# Patient Record
Sex: Male | Born: 1945 | Race: White | Hispanic: No | Marital: Married | Smoking: Former smoker
Health system: Southern US, Community
[De-identification: ages and names within clinical notes are randomized; demographics above are authoritative.]

## PROBLEM LIST (undated history)

## (undated) ENCOUNTER — Emergency Department (HOSPITAL_BASED_OUTPATIENT_CLINIC_OR_DEPARTMENT_OTHER): Admission: EM | Payer: No Typology Code available for payment source

## (undated) DIAGNOSIS — I639 Cerebral infarction, unspecified: Secondary | ICD-10-CM

## (undated) DIAGNOSIS — J939 Pneumothorax, unspecified: Secondary | ICD-10-CM

## (undated) DIAGNOSIS — J8410 Pulmonary fibrosis, unspecified: Secondary | ICD-10-CM

## (undated) DIAGNOSIS — Z860100 Personal history of colon polyps, unspecified: Secondary | ICD-10-CM

## (undated) DIAGNOSIS — J189 Pneumonia, unspecified organism: Secondary | ICD-10-CM

## (undated) DIAGNOSIS — R51 Headache: Secondary | ICD-10-CM

## (undated) DIAGNOSIS — I10 Essential (primary) hypertension: Secondary | ICD-10-CM

## (undated) DIAGNOSIS — R413 Other amnesia: Secondary | ICD-10-CM

## (undated) DIAGNOSIS — M1990 Unspecified osteoarthritis, unspecified site: Secondary | ICD-10-CM

## (undated) DIAGNOSIS — R0602 Shortness of breath: Secondary | ICD-10-CM

## (undated) DIAGNOSIS — I739 Peripheral vascular disease, unspecified: Secondary | ICD-10-CM

## (undated) DIAGNOSIS — F419 Anxiety disorder, unspecified: Secondary | ICD-10-CM

## (undated) HISTORY — DX: Personal history of colon polyps, unspecified: Z86.0100

## (undated) HISTORY — PX: FRACTURE SURGERY: SHX138

## (undated) HISTORY — PX: EYE SURGERY: SHX253

## (undated) HISTORY — PX: COLONOSCOPY W/ BIOPSIES AND POLYPECTOMY: SHX1376

## (undated) HISTORY — PX: JOINT REPLACEMENT: SHX530

## (undated) HISTORY — DX: Pulmonary fibrosis, unspecified: J84.10

## (undated) HISTORY — DX: Other amnesia: R41.3

## (undated) HISTORY — PX: CARDIAC CATHETERIZATION: SHX172

## (undated) HISTORY — DX: Personal history of colonic polyps: Z86.010

## (undated) HISTORY — PX: TONSILLECTOMY: SUR1361

---

## 1997-09-13 ENCOUNTER — Ambulatory Visit (HOSPITAL_COMMUNITY): Admission: RE | Admit: 1997-09-13 | Discharge: 1997-09-13 | Payer: Self-pay | Admitting: Specialist

## 1997-09-19 ENCOUNTER — Ambulatory Visit (HOSPITAL_COMMUNITY): Admission: RE | Admit: 1997-09-19 | Discharge: 1997-09-19 | Payer: Self-pay | Admitting: Specialist

## 1998-09-05 ENCOUNTER — Emergency Department (HOSPITAL_COMMUNITY): Admission: EM | Admit: 1998-09-05 | Discharge: 1998-09-05 | Payer: Self-pay | Admitting: Emergency Medicine

## 1998-09-05 ENCOUNTER — Encounter: Payer: Self-pay | Admitting: Emergency Medicine

## 1999-04-21 ENCOUNTER — Ambulatory Visit (HOSPITAL_COMMUNITY): Admission: RE | Admit: 1999-04-21 | Discharge: 1999-04-21 | Payer: Self-pay | Admitting: Specialist

## 2000-05-09 ENCOUNTER — Inpatient Hospital Stay (HOSPITAL_COMMUNITY): Admission: EM | Admit: 2000-05-09 | Discharge: 2000-05-21 | Payer: Self-pay | Admitting: *Deleted

## 2000-05-09 ENCOUNTER — Encounter: Payer: Self-pay | Admitting: Surgery

## 2000-05-09 ENCOUNTER — Encounter: Payer: Self-pay | Admitting: Specialist

## 2000-05-10 ENCOUNTER — Encounter: Payer: Self-pay | Admitting: General Surgery

## 2000-05-11 ENCOUNTER — Encounter: Payer: Self-pay | Admitting: General Surgery

## 2000-05-12 ENCOUNTER — Encounter: Payer: Self-pay | Admitting: General Surgery

## 2000-05-13 ENCOUNTER — Encounter: Payer: Self-pay | Admitting: Orthopedic Surgery

## 2000-05-14 ENCOUNTER — Encounter: Payer: Self-pay | Admitting: Surgery

## 2000-05-17 ENCOUNTER — Encounter: Payer: Self-pay | Admitting: General Surgery

## 2000-05-18 ENCOUNTER — Encounter: Payer: Self-pay | Admitting: Surgery

## 2000-07-05 ENCOUNTER — Ambulatory Visit (HOSPITAL_COMMUNITY): Admission: RE | Admit: 2000-07-05 | Discharge: 2000-07-05 | Payer: Self-pay | Admitting: Family Medicine

## 2000-09-10 ENCOUNTER — Encounter: Admission: RE | Admit: 2000-09-10 | Discharge: 2000-09-10 | Payer: Self-pay | Admitting: Specialist

## 2000-09-10 ENCOUNTER — Encounter: Payer: Self-pay | Admitting: Specialist

## 2000-11-06 ENCOUNTER — Ambulatory Visit (HOSPITAL_COMMUNITY): Admission: RE | Admit: 2000-11-06 | Discharge: 2000-11-06 | Payer: Self-pay | Admitting: Family Medicine

## 2000-11-06 ENCOUNTER — Encounter: Payer: Self-pay | Admitting: Family Medicine

## 2001-09-01 ENCOUNTER — Encounter: Admission: RE | Admit: 2001-09-01 | Discharge: 2001-09-01 | Payer: Self-pay | Admitting: Family Medicine

## 2001-09-01 ENCOUNTER — Encounter: Payer: Self-pay | Admitting: Family Medicine

## 2002-03-22 ENCOUNTER — Ambulatory Visit (HOSPITAL_COMMUNITY): Admission: RE | Admit: 2002-03-22 | Discharge: 2002-03-22 | Payer: Self-pay | Admitting: Gastroenterology

## 2004-12-05 ENCOUNTER — Emergency Department (HOSPITAL_COMMUNITY): Admission: EM | Admit: 2004-12-05 | Discharge: 2004-12-06 | Payer: Self-pay | Admitting: Emergency Medicine

## 2010-12-31 ENCOUNTER — Emergency Department (HOSPITAL_COMMUNITY): Payer: Non-veteran care

## 2010-12-31 ENCOUNTER — Emergency Department (HOSPITAL_COMMUNITY)
Admission: EM | Admit: 2010-12-31 | Discharge: 2011-01-01 | Payer: Non-veteran care | Source: Home / Self Care | Attending: Emergency Medicine | Admitting: Emergency Medicine

## 2010-12-31 DIAGNOSIS — R072 Precordial pain: Secondary | ICD-10-CM | POA: Insufficient documentation

## 2010-12-31 DIAGNOSIS — E785 Hyperlipidemia, unspecified: Secondary | ICD-10-CM | POA: Insufficient documentation

## 2010-12-31 DIAGNOSIS — Z87891 Personal history of nicotine dependence: Secondary | ICD-10-CM | POA: Insufficient documentation

## 2010-12-31 DIAGNOSIS — I2510 Atherosclerotic heart disease of native coronary artery without angina pectoris: Secondary | ICD-10-CM | POA: Insufficient documentation

## 2010-12-31 DIAGNOSIS — Z8249 Family history of ischemic heart disease and other diseases of the circulatory system: Secondary | ICD-10-CM | POA: Insufficient documentation

## 2010-12-31 DIAGNOSIS — R61 Generalized hyperhidrosis: Secondary | ICD-10-CM | POA: Insufficient documentation

## 2010-12-31 DIAGNOSIS — R110 Nausea: Secondary | ICD-10-CM | POA: Insufficient documentation

## 2010-12-31 LAB — CBC
HCT: 40.2 % (ref 39.0–52.0)
Hemoglobin: 14.3 g/dL (ref 13.0–17.0)
MCHC: 35.6 g/dL (ref 30.0–36.0)
MCV: 90.3 fL (ref 78.0–100.0)
RDW: 12.1 % (ref 11.5–15.5)

## 2010-12-31 LAB — URINALYSIS, ROUTINE W REFLEX MICROSCOPIC
Bilirubin Urine: NEGATIVE
Glucose, UA: NEGATIVE mg/dL
Ketones, ur: NEGATIVE mg/dL
Protein, ur: NEGATIVE mg/dL
pH: 8 (ref 5.0–8.0)

## 2010-12-31 LAB — CK TOTAL AND CKMB (NOT AT ARMC)
CK, MB: 2.9 ng/mL (ref 0.3–4.0)
CK, MB: 2.6 ng/mL (ref 0.3–4.0)
Relative Index: INVALID (ref 0.0–2.5)
Relative Index: INVALID (ref 0.0–2.5)
Total CK: 88 U/L (ref 7–232)
Total CK: 71 U/L (ref 7–232)

## 2010-12-31 LAB — TROPONIN I
Troponin I: <0.3 ng/mL (ref <0.30)
Troponin I: <0.3 ng/mL (ref <0.30)

## 2010-12-31 LAB — COMPREHENSIVE METABOLIC PANEL
Albumin: 4 g/dL (ref 3.5–5.2)
Alkaline Phosphatase: 81 U/L (ref 39–117)
BUN: 20 mg/dL (ref 6–23)
Chloride: 104 mEq/L (ref 96–112)
Creatinine, Ser: 0.85 mg/dL (ref 0.50–1.35)
GFR calc Af Amer: >60 mL/min (ref >60)
Glucose, Bld: 110 mg/dL — ABNORMAL HIGH (ref 70–99)
Potassium: 4 mEq/L (ref 3.5–5.1)
Total Bilirubin: 0.4 mg/dL (ref 0.3–1.2)
Total Protein: 7.3 g/dL (ref 6.0–8.3)

## 2010-12-31 LAB — DIFFERENTIAL
Basophils Absolute: 0 10*3/uL (ref 0.0–0.1)
Eosinophils Relative: 2 % (ref 0–5)
Lymphocytes Relative: 17 % (ref 12–46)
Lymphs Abs: 1.1 10*3/uL (ref 0.7–4.0)
Monocytes Absolute: 0.6 10*3/uL (ref 0.1–1.0)
Monocytes Relative: 9 % (ref 3–12)
Neutro Abs: 4.6 10*3/uL (ref 1.7–7.7)

## 2011-01-01 ENCOUNTER — Observation Stay (HOSPITAL_COMMUNITY)
Admission: EM | Admit: 2011-01-01 | Discharge: 2011-01-02 | Disposition: A | Discharge status: Home / Self Care | Payer: Non-veteran care | Source: Ambulatory Visit | Attending: Cardiovascular Disease | Admitting: Cardiovascular Disease

## 2011-01-01 ENCOUNTER — Emergency Department (HOSPITAL_COMMUNITY): Payer: Non-veteran care

## 2011-01-01 DIAGNOSIS — F4310 Post-traumatic stress disorder, unspecified: Secondary | ICD-10-CM | POA: Insufficient documentation

## 2011-01-01 DIAGNOSIS — Z01812 Encounter for preprocedural laboratory examination: Secondary | ICD-10-CM | POA: Insufficient documentation

## 2011-01-01 DIAGNOSIS — R110 Nausea: Secondary | ICD-10-CM | POA: Insufficient documentation

## 2011-01-01 DIAGNOSIS — I2510 Atherosclerotic heart disease of native coronary artery without angina pectoris: Secondary | ICD-10-CM | POA: Insufficient documentation

## 2011-01-01 DIAGNOSIS — R61 Generalized hyperhidrosis: Secondary | ICD-10-CM | POA: Insufficient documentation

## 2011-01-01 DIAGNOSIS — Z8249 Family history of ischemic heart disease and other diseases of the circulatory system: Secondary | ICD-10-CM | POA: Insufficient documentation

## 2011-01-01 DIAGNOSIS — I714 Abdominal aortic aneurysm, without rupture, unspecified: Secondary | ICD-10-CM | POA: Insufficient documentation

## 2011-01-01 DIAGNOSIS — Z79899 Other long term (current) drug therapy: Secondary | ICD-10-CM | POA: Insufficient documentation

## 2011-01-01 DIAGNOSIS — E785 Hyperlipidemia, unspecified: Secondary | ICD-10-CM | POA: Insufficient documentation

## 2011-01-01 DIAGNOSIS — R079 Chest pain, unspecified: Principal | ICD-10-CM | POA: Insufficient documentation

## 2011-01-01 DIAGNOSIS — Z01810 Encounter for preprocedural cardiovascular examination: Secondary | ICD-10-CM | POA: Insufficient documentation

## 2011-01-01 DIAGNOSIS — Z01818 Encounter for other preprocedural examination: Secondary | ICD-10-CM | POA: Insufficient documentation

## 2011-01-01 LAB — CK TOTAL AND CKMB (NOT AT ARMC)
CK, MB: 2.4 ng/mL (ref 0.3–4.0)
Relative Index: INVALID (ref 0.0–2.5)

## 2011-01-01 LAB — BASIC METABOLIC PANEL
BUN: 13 mg/dL (ref 6–23)
CO2: 26 mEq/L (ref 19–32)
Chloride: 105 mEq/L (ref 96–112)
Creatinine, Ser: 0.82 mg/dL (ref 0.50–1.35)
Glucose, Bld: 96 mg/dL (ref 70–99)

## 2011-01-01 LAB — MAGNESIUM: Magnesium: 2.2 mg/dL (ref 1.5–2.5)

## 2011-01-01 MED ORDER — IOHEXOL 350 MG/ML SOLN
100.0000 mL | Freq: Once | INTRAVENOUS | Status: AC | PRN
  Administered 2011-01-01: 160 mL via INTRAVENOUS

## 2011-01-02 LAB — CBC
Hemoglobin: 12.8 g/dL — ABNORMAL LOW (ref 13.0–17.0)
MCHC: 34.4 g/dL (ref 30.0–36.0)
RBC: 4.04 MIL/uL — ABNORMAL LOW (ref 4.22–5.81)
WBC: 6.1 10*3/uL (ref 4.0–10.5)

## 2011-01-02 LAB — BASIC METABOLIC PANEL
Chloride: 108 mEq/L (ref 96–112)
GFR calc Af Amer: >60 mL/min (ref >60)
GFR calc non Af Amer: >60 mL/min (ref >60)
Glucose, Bld: 91 mg/dL (ref 70–99)
Potassium: 3.8 mEq/L (ref 3.5–5.1)
Sodium: 140 mEq/L (ref 135–145)

## 2011-01-07 NOTE — Cardiovascular Report (Signed)
NAME:  Luke Golden, Luke Golden NO.:  [id number]  MEDICAL RECORD NO.:  [id number]  LOCATION:  [facility]                         FACILITY:  [facility]  PHYSICIAN:  Nicki Guadalajara, M.D.     DATE OF BIRTH:  March 11, 1946  DATE OF PROCEDURE:  01/01/2011 DATE OF DISCHARGE:  01/01/2011                           CARDIAC CATHETERIZATION   INDICATIONS:  Mr. Luke Golden is a 65 year old gentleman who has a history of hyperlipidemia, family history of coronary artery disease. Apparently, he may have had a normal cardiac catheterization over 10 years ago.  Apparently, he presented to [facility] last night. The patient does work nights and yesterday after taking Viagra became hypotensive and also developed midsternal chest pain, which had awakened him from sleep.  He had a cardiac CT scan today, which showed a calcium score of 1076.  The patient was seen in the emergency room today by Dr. Royann Shivers.  Cardiac enzymes were negative.  He is now referred for definitive cardiac catheterization.  PROCEDURE:  The patient was extremely anxious and wanted significant sedation for the procedure.  In the emergency room, he had received earlier 10 mg of Valium.  In the cardiac catheterization laboratory, he received 2 mg of Versed plus 50 mcg of fentanyl.  Right femoral artery was punctured anteriorly and a 5-French sheath was inserted without difficulty.  Diagnostic cardiac catheterization was done utilizing 5- [place] Judkins for left and right coronary catheters.  A 5-French pigtail catheter was used for left ventriculography.  This was used for the left ventriculography as well as distal aortography.  The patient tolerated the procedure well.  Hemostasis was obtained by direct manual pressure.  HEMODYNAMIC DATA:  Central aortic pressure is 110/50.  Left ventricular pressure 110/12.  ANGIOGRAPHIC DATA:  There was evidence for significant calcification involving the left main, LAD, and right  coronary artery.  The left main had ostial calcification with ostial narrowing of 20%.  There was mild distal narrowing of 10% and bifurcated into the LAD and left circumflex system.  The LAD was calcified and had irregularity proximally.  There was some mid LAD intramyocardial segment without significant focal stenoses.  The circumflex ostium had 20% smooth narrowing and gave rise to one major marginal vessel.  There was calcification in the sinus of Valsalva extending into the aorta in the region of the right coronary artery takeoff.  The right coronary artery gave rise to a very proximal conus branch.  There was 40% ostial and proximal irregularity in the right coronary artery. There was 20% mild narrowing in the ostium of the PDA.  Left ventriculography revealed ejection fraction of approximately 50- 55%.  There was a suggestion of mild angiographic mitral valve prolapse.  Distal aortography did not demonstrate any renal artery stenoses.  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  IMPRESSION: 1. Evidence for coronary calcification involving the left main, left     anterior descending coronary artery, circumflex, and right coronary     artery with 20% ostial left main narrowing, luminal irregularities     without significant obstruction in the left anterior descending  coronary artery; 20% ostial circumflex narrowing; and 40-50% ostial     proximal right coronary artery stenosis with 20% posterior     descending artery stenosis. 2. 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  RECOMMENDATIONS:  Medical therapy.  The patient will require aggressive lipid management in attempt to induce plaque stability and regression.          ______________________________ Nicki Guadalajara, M.D.     TK/MEDQ  D:  01/01/2011  T:  01/02/2011  Job:  [id number]  cc:   [facility] Dr[facility] Evette Cristal  Electronically  Signed by Nicki Guadalajara M.D. on 01/07/2011 04:37:46 PM
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
## 2011-01-12 NOTE — Discharge Summary (Signed)
  Luke Golden, Luke Golden                ACCOUNT NO.:  [id number]  MEDICAL RECORD NO.:  [id number]  LOCATION:  [facility]                         FACILITY:  [facility]  PHYSICIAN:  Thurmon Fair, MD     DATE OF BIRTH:  [birth year]  DATE OF ADMISSION:  12/31/2010 DATE OF DISCHARGE:  01/01/2011                              DISCHARGE SUMMARY   DISCHARGE DIAGNOSES: 1. Chest pain worrisome for unstable angina, moderate coronary artery     disease at catheterization this admission to be treated medically. 2. Treated dyslipidemia. 3. Post-traumatic stress disorder. 4. Family history of coronary artery disease. 5. Baseline sinus bradycardia, the patient was not sent home on a beta-     blocker.  HOSPITAL COURSE:  The patient is a 65 year old [place] veteran with post- traumatic stress disorder who [facility]  He reportedly had a catheterization more than 10 years ago which was normal.  He does have risk factors for coronary artery disease.  He presented to [facility] emergency room December 31, 2010, with chest pain, he was brought in by EMS.  Please see history and physical for complete details.  Coronary CT done showed high calcium score and we were asked to see him in consult.  The patient was seen by Dr. Royann Shivers.  His symptoms were worrisome for angina.  It was decided to proceed with a diagnostic coronary angiography.  This was done on January 01, 2011, by Dr. Daphene Jaeger.  This revealed a 50% proximal calcified RCA, with no other significant coronary artery disease.  Ejection fraction was 50- 55%, he did have mitral valve prolapse noted.  Renal arteries and iliac were essentially normal.  Plan is for continued aggressive medical therapy.  We did not put him on a beta blocker because of baseline bradycardia.  We did add low-dose Norvasc and ACE inhibitor and increased his statin.  He is a nonsmoker.  He will follow up with Dr. Royann Shivers as an outpatient.  LABORATORY DATA:  At  discharge, his white count is 6.1, hemoglobin 12.8, hematocrit 37.2, platelets 143.  Sodium 140, potassium 3.8, BUN 15, creatinine 0.96.  Troponins were negative x4.  TSH 2.78.  CK-MBs were negative.  Urinalysis unremarkable.  Liver functions were normal.  Two view chest x-ray showed no evidence of cardiopulmonary disease. Coronary CT showed calcium score of 1076 with some hard plaque in the origin of the LAD, by CT the stenosis appeared to be 50% or greater. EKG shows sinus rhythm, sinus bradycardia.  DISPOSITION:  The patient is discharged in stable condition and will follow up with Dr. Royann Shivers.  Please see med rec for complete discharge medications.     Abelino Derrick, P.A.   ______________________________ Thurmon Fair, MD    LKK/MEDQ  D:  01/02/2011  T:  01/02/2011  Job:  [id number]  cc:   Dr. Flonnie Hailstone  Electronically Signed by Corine Shelter P.A. on 01/12/2011 09:02:08 AM Electronically Signed by Thurmon Fair M.D. on 01/12/2011 01:40:21 PM

## 2011-10-28 ENCOUNTER — Other Ambulatory Visit: Payer: Self-pay | Admitting: Cardiology

## 2011-10-28 ENCOUNTER — Encounter (INDEPENDENT_AMBULATORY_CARE_PROVIDER_SITE_OTHER): Payer: Non-veteran care

## 2011-10-28 DIAGNOSIS — M7989 Other specified soft tissue disorders: Secondary | ICD-10-CM

## 2011-10-28 DIAGNOSIS — R609 Edema, unspecified: Secondary | ICD-10-CM

## 2011-10-28 DIAGNOSIS — M79609 Pain in unspecified limb: Secondary | ICD-10-CM

## 2011-12-01 ENCOUNTER — Other Ambulatory Visit (HOSPITAL_COMMUNITY): Payer: Non-veteran care

## 2011-12-08 ENCOUNTER — Encounter (HOSPITAL_COMMUNITY): Payer: Self-pay | Admitting: Pharmacy Technician

## 2011-12-15 ENCOUNTER — Other Ambulatory Visit (HOSPITAL_COMMUNITY): Payer: Non-veteran care

## 2011-12-18 ENCOUNTER — Encounter (HOSPITAL_COMMUNITY)
Admission: RE | Admit: 2011-12-18 | Discharge: 2011-12-18 | Disposition: A | Discharge status: Home / Self Care | Payer: Non-veteran care | Source: Ambulatory Visit | Attending: Orthopedic Surgery | Admitting: Orthopedic Surgery

## 2011-12-18 ENCOUNTER — Encounter (HOSPITAL_COMMUNITY): Admission: RE | Admit: 2011-12-18 | Payer: Non-veteran care | Source: Ambulatory Visit

## 2011-12-18 ENCOUNTER — Encounter (HOSPITAL_COMMUNITY): Payer: Self-pay

## 2011-12-18 HISTORY — DX: Essential (primary) hypertension: I10

## 2011-12-18 HISTORY — DX: Pneumothorax, unspecified: J93.9

## 2011-12-18 HISTORY — DX: Unspecified osteoarthritis, unspecified site: M19.90

## 2011-12-18 HISTORY — DX: Pneumonia, unspecified organism: J18.9

## 2011-12-18 HISTORY — DX: Shortness of breath: R06.02

## 2011-12-18 HISTORY — DX: Peripheral vascular disease, unspecified: I73.9

## 2011-12-18 HISTORY — DX: Anxiety disorder, unspecified: F41.9

## 2011-12-18 LAB — URINALYSIS, ROUTINE W REFLEX MICROSCOPIC
Leukocytes, UA: NEGATIVE
Protein, ur: NEGATIVE mg/dL
Urobilinogen, UA: 0.2 mg/dL (ref 0.0–1.0)

## 2011-12-18 LAB — TYPE AND SCREEN
ABO/RH(D): O NEG
Antibody Screen: NEGATIVE

## 2011-12-18 LAB — CBC
MCH: 31.6 pg (ref 26.0–34.0)
MCHC: 33.7 g/dL (ref 30.0–36.0)
Platelets: 156 10*3/uL (ref 150–400)
RBC: 4.5 MIL/uL (ref 4.22–5.81)

## 2011-12-18 LAB — COMPREHENSIVE METABOLIC PANEL
ALT: 13 U/L (ref 0–53)
AST: 25 U/L (ref 0–37)
Albumin: 3.8 g/dL (ref 3.5–5.2)
Calcium: 9.5 mg/dL (ref 8.4–10.5)
Sodium: 139 mEq/L (ref 135–145)
Total Protein: 7.4 g/dL (ref 6.0–8.3)

## 2011-12-18 LAB — SURGICAL PCR SCREEN: MRSA, PCR: NEGATIVE

## 2011-12-18 NOTE — Pre-Procedure Instructions (Signed)
20 AUTREY HUMAN  12/18/2011   Your procedure is scheduled on:  12/23/2011  Report to [facility] at 6:30 AM.  Call this number if you have problems the morning of surgery: [phone]   Remember:   Do not eat food or drink :After Midnight.  TUESDAY      Take these medicines the morning of surgery with A SIP OF WATER:  BUSPAR   Do not wear jewelry, make-up or nail polish.  Do not wear lotions, powders, or perfumes. You may wear deodorant.  Do not shave 48 hours prior to surgery. Men may shave face and neck.  Do not bring valuables to the hospital.  Contacts, dentures or bridgework may not be worn into surgery.  Leave suitcase in the car. After surgery it may be brought to your room.  For patients admitted to the hospital, checkout time is 11:00 AM the day of discharge.   Patients discharged the day of surgery will not be allowed to drive home.  Name and phone number of your driver: /w wife  Special Instructions: CHG Shower Use Special Wash: 1/2 bottle night before surgery and 1/2 bottle morning of surgery.   Please read over the following fact sheets that you were given: Pain Booklet, Coughing and Deep Breathing, Blood Transfusion Information, Total Joint Packet, MRSA Information and Surgical Site Infection Prevention

## 2011-12-18 NOTE — Progress Notes (Signed)
[facility] [place] Report he has had additional cardiac testing at [facility] since Cardiac cath. Done here 2012.

## 2011-12-22 MED ORDER — VANCOMYCIN HCL IN DEXTROSE 1-5 GM/200ML-% IV SOLN
1000.0000 mg | INTRAVENOUS | Status: AC
  Administered 2011-12-23: 1000 mg via INTRAVENOUS
  Filled 2011-12-22: qty 200

## 2011-12-22 NOTE — H&P (Signed)
  [facility] [address]   [address] [place], [place] [id number] [phone] A Division of [facility]  Loreta Ave, M.D.     Robert A. Thurston Hole, M.D.     Lunette Stands, M.D. Eulas Post, M.D.    Buford Dresser, M.D. Estell Harpin, M.D. Genene Churn. Barry Dienes, PA-C            Kirstin A. Shepperson, PA-C [place], OPA-C   RE: Luke, Golden                                [id number]      DOB: February 12, 1946 PROGRESS NOTE: 12-15-11 Chief complaint: Left knee pain.  History of present illness: 66 year-old white male with a history of end stage DJD, left knee, and chronic pain.  Returns.  States that knee symptoms are unchanged from previous visit.  He is wanting to proceed with total knee replacement as scheduled next week.  We received appropriate pre-op medical and cardiac clearances.   Current medications: Artificial Tears, Aspirin, Colace, Ibuprofen, Lisinopril, Toprol, Omeprazole, Rosuvastatin, Nitroglycerin 0.4 mg sublingual tablets p.r.n. and Viagra. Allergies: Tylenol, Cephalexin and Lipitor. Past medical/surgical history: Posttraumatic stress disorder, hypertension, hypercholesterolemia, coronary artery disease and GERD. Review of systems: Patient currently denies lightheadedness, dizziness, fevers or chills, cardiac, pulmonary, GI or GU issues. Family history: Positive for heart disease and arthritis. 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          EXAMINATION: Height: 5?8.  Weight: 206 pounds.  Blood pressure: 124/64.  Pulse: 48.  Temperature: 98.3.  Pleasant white male, alert and oriented x 3 and in no acute distress.  No increase in respiratory effort.  Head is normocephalic, a traumatic.  PERRLA, EOMI.  Cervical spine unremarkable.  Lungs: CTA bilaterally.  No wheezes.  Heart: RRR, bradycardic.  Abdomen: Round and non-distended.  NBS x 4.  Soft and non-tender.  Left knee: Decreased range of  motion.  Positive crepitus.  Joint line tender.  Positive effusion.  Ligaments stable.  Calf non-tender.  Neurovascularly intact.  Skin warm and dry.   IMPRESSION: End stage DJD, left knee, and chronic pain.  Failed conservative treatment.  PLAN: We will proceed with left total knee replacement as scheduled.  Surgical procedure, along with potential rehab/recovery time discussed.  All questions answered.  There have been multiple phone calls and discussions regarding postoperative care.  We feel that it will be best that he discharge home from the hospital instead of to a rehab center post-op.  His wife will be immediately available to him for about 12-13 days post-op and then he will see about getting someone else to help him afterwards.  Patient's wife agreed with this plan.    Loreta Ave, M.D.   Electronically verified by Loreta Ave, M.D. DFM(JMO):jjh D 12-15-11 T 12-16-11

## 2011-12-23 ENCOUNTER — Inpatient Hospital Stay (HOSPITAL_COMMUNITY): Payer: Non-veteran care

## 2011-12-23 ENCOUNTER — Encounter (HOSPITAL_COMMUNITY): Payer: Self-pay | Admitting: *Deleted

## 2011-12-23 ENCOUNTER — Ambulatory Visit (HOSPITAL_COMMUNITY): Payer: Non-veteran care | Admitting: Certified Registered Nurse Anesthetist

## 2011-12-23 ENCOUNTER — Encounter (HOSPITAL_COMMUNITY): Payer: Self-pay | Admitting: Anesthesiology

## 2011-12-23 ENCOUNTER — Encounter (HOSPITAL_COMMUNITY)
Admission: RE | Disposition: A | Discharge status: Home Health | Payer: Self-pay | Source: Ambulatory Visit | Attending: Orthopedic Surgery

## 2011-12-23 ENCOUNTER — Encounter (HOSPITAL_COMMUNITY): Payer: Self-pay | Admitting: Certified Registered Nurse Anesthetist

## 2011-12-23 ENCOUNTER — Inpatient Hospital Stay (HOSPITAL_COMMUNITY)
Admission: RE | Admit: 2011-12-23 | Discharge: 2011-12-25 | DRG: 470 | Disposition: A | Discharge status: Home Health | Payer: Non-veteran care | Source: Ambulatory Visit | Attending: Orthopedic Surgery | Admitting: Orthopedic Surgery

## 2011-12-23 DIAGNOSIS — Z01812 Encounter for preprocedural laboratory examination: Secondary | ICD-10-CM

## 2011-12-23 DIAGNOSIS — I2510 Atherosclerotic heart disease of native coronary artery without angina pectoris: Secondary | ICD-10-CM | POA: Diagnosis present

## 2011-12-23 DIAGNOSIS — Z471 Aftercare following joint replacement surgery: Secondary | ICD-10-CM

## 2011-12-23 DIAGNOSIS — Z8249 Family history of ischemic heart disease and other diseases of the circulatory system: Secondary | ICD-10-CM

## 2011-12-23 DIAGNOSIS — I10 Essential (primary) hypertension: Secondary | ICD-10-CM | POA: Diagnosis present

## 2011-12-23 DIAGNOSIS — F4310 Post-traumatic stress disorder, unspecified: Secondary | ICD-10-CM | POA: Diagnosis present

## 2011-12-23 DIAGNOSIS — M1710 Unilateral primary osteoarthritis, unspecified knee: Principal | ICD-10-CM | POA: Diagnosis present

## 2011-12-23 DIAGNOSIS — K219 Gastro-esophageal reflux disease without esophagitis: Secondary | ICD-10-CM | POA: Diagnosis present

## 2011-12-23 HISTORY — PX: TOTAL KNEE ARTHROPLASTY: SHX125

## 2011-12-23 SURGERY — ARTHROPLASTY, KNEE, TOTAL
Anesthesia: General | Site: Knee | Laterality: Left | Wound class: Clean

## 2011-12-23 MED ORDER — METOPROLOL TARTRATE 25 MG PO TABS
25.0000 mg | ORAL_TABLET | Freq: Every day | ORAL | Status: DC
  Administered 2011-12-24: 25 mg via ORAL
  Filled 2011-12-23 (×3): qty 1

## 2011-12-23 MED ORDER — LIDOCAINE HCL (CARDIAC) 20 MG/ML IV SOLN
INTRAVENOUS | Status: DC | PRN
  Administered 2011-12-23: 50 mg via INTRAVENOUS

## 2011-12-23 MED ORDER — FENTANYL CITRATE 0.05 MG/ML IJ SOLN
INTRAMUSCULAR | Status: DC | PRN
  Administered 2011-12-23 (×5): 50 ug via INTRAVENOUS
  Administered 2011-12-23: 25 ug via INTRAVENOUS
  Administered 2011-12-23: 50 ug via INTRAVENOUS

## 2011-12-23 MED ORDER — POTASSIUM CHLORIDE IN NACL 20-0.9 MEQ/L-% IV SOLN
INTRAVENOUS | Status: DC
  Administered 2011-12-23 – 2011-12-25 (×3): via INTRAVENOUS
  Filled 2011-12-23 (×7): qty 1000

## 2011-12-23 MED ORDER — KETOROLAC TROMETHAMINE 30 MG/ML IJ SOLN: 15.0000 mg | Freq: Once | INTRAMUSCULAR | Status: DC | PRN

## 2011-12-23 MED ORDER — DOCUSATE SODIUM 100 MG PO CAPS
100.0000 mg | ORAL_CAPSULE | Freq: Two times a day (BID) | ORAL | Status: DC
  Administered 2011-12-23 – 2011-12-25 (×4): 100 mg via ORAL
  Filled 2011-12-23 (×6): qty 1

## 2011-12-23 MED ORDER — BUPIVACAINE HCL (PF) 0.25 % IJ SOLN
INTRAMUSCULAR | Status: AC
  Filled 2011-12-23: qty 30

## 2011-12-23 MED ORDER — WARFARIN VIDEO
Freq: Once | Status: AC
  Administered 2011-12-23: 13:00:00

## 2011-12-23 MED ORDER — BUPIVACAINE HCL (PF) 0.25 % IJ SOLN
INTRAMUSCULAR | Status: DC | PRN
  Administered 2011-12-23: 30 mL via INTRA_ARTICULAR

## 2011-12-23 MED ORDER — SODIUM CHLORIDE 0.9 % IR SOLN
Status: DC | PRN
  Administered 2011-12-23: 3000 mL

## 2011-12-23 MED ORDER — MEPERIDINE HCL 25 MG/ML IJ SOLN: 6.2500 mg | INTRAMUSCULAR | Status: DC | PRN

## 2011-12-23 MED ORDER — MIDAZOLAM HCL 5 MG/5ML IJ SOLN
INTRAMUSCULAR | Status: DC | PRN
  Administered 2011-12-23: 2 mg via INTRAVENOUS

## 2011-12-23 MED ORDER — MORPHINE SULFATE (PF) 1 MG/ML IV SOLN
INTRAVENOUS | Status: DC
  Administered 2011-12-23: 8.08 mg via INTRAVENOUS
  Administered 2011-12-23: 16.5 mg via INTRAVENOUS
  Administered 2011-12-23: 11:00:00 via INTRAVENOUS
  Administered 2011-12-24: 7.3 mg via INTRAVENOUS
  Administered 2011-12-24: 6 mg via INTRAVENOUS
  Administered 2011-12-24: 03:00:00 via INTRAVENOUS
  Administered 2011-12-24: 15 mg via INTRAVENOUS
  Filled 2011-12-23 (×2): qty 25

## 2011-12-23 MED ORDER — METHOCARBAMOL 500 MG PO TABS
ORAL_TABLET | ORAL | Status: AC
  Filled 2011-12-23: qty 1

## 2011-12-23 MED ORDER — ACETAMINOPHEN 325 MG PO TABS: 650.0000 mg | ORAL_TABLET | Freq: Four times a day (QID) | ORAL | Status: DC | PRN

## 2011-12-23 MED ORDER — BISACODYL 10 MG RE SUPP: 10.0000 mg | Freq: Every day | RECTAL | Status: DC | PRN

## 2011-12-23 MED ORDER — ACETAMINOPHEN 650 MG RE SUPP: 650.0000 mg | Freq: Four times a day (QID) | RECTAL | Status: DC | PRN

## 2011-12-23 MED ORDER — LACTATED RINGERS IV SOLN
INTRAVENOUS | Status: DC | PRN
  Administered 2011-12-23 (×2): via INTRAVENOUS

## 2011-12-23 MED ORDER — PROPOFOL 10 MG/ML IV EMUL
INTRAVENOUS | Status: DC | PRN
  Administered 2011-12-23: 200 mg via INTRAVENOUS

## 2011-12-23 MED ORDER — SODIUM CHLORIDE 0.9 % IJ SOLN: 9.0000 mL | INTRAMUSCULAR | Status: DC | PRN

## 2011-12-23 MED ORDER — ONDANSETRON HCL 4 MG/2ML IJ SOLN: 4.0000 mg | Freq: Four times a day (QID) | INTRAMUSCULAR | Status: DC | PRN

## 2011-12-23 MED ORDER — ENOXAPARIN SODIUM 30 MG/0.3ML ~~LOC~~ SOLN
30.0000 mg | Freq: Two times a day (BID) | SUBCUTANEOUS | Status: DC
  Administered 2011-12-24 – 2011-12-25 (×3): 30 mg via SUBCUTANEOUS
  Filled 2011-12-23 (×5): qty 0.3

## 2011-12-23 MED ORDER — SENNOSIDES-DOCUSATE SODIUM 8.6-50 MG PO TABS: 1.0000 | ORAL_TABLET | Freq: Every evening | ORAL | Status: DC | PRN

## 2011-12-23 MED ORDER — BUSPIRONE HCL 10 MG PO TABS
10.0000 mg | ORAL_TABLET | Freq: Two times a day (BID) | ORAL | Status: DC
  Administered 2011-12-24 – 2011-12-25 (×3): 10 mg via ORAL
  Filled 2011-12-23 (×5): qty 1

## 2011-12-23 MED ORDER — SIMVASTATIN 10 MG PO TABS
10.0000 mg | ORAL_TABLET | Freq: Every day | ORAL | Status: DC
  Administered 2011-12-23 – 2011-12-25 (×3): 10 mg via ORAL
  Filled 2011-12-23 (×3): qty 1

## 2011-12-23 MED ORDER — OXYCODONE HCL 5 MG PO TABS
ORAL_TABLET | ORAL | Status: AC
  Filled 2011-12-23: qty 1

## 2011-12-23 MED ORDER — LISINOPRIL 2.5 MG PO TABS
2.5000 mg | ORAL_TABLET | Freq: Every day | ORAL | Status: DC
  Administered 2011-12-24: 2.5 mg via ORAL
  Filled 2011-12-23 (×3): qty 1

## 2011-12-23 MED ORDER — PHENOL 1.4 % MT LIQD: 1.0000 | OROMUCOSAL | Status: DC | PRN

## 2011-12-23 MED ORDER — ONDANSETRON HCL 4 MG/2ML IJ SOLN
INTRAMUSCULAR | Status: DC | PRN
  Administered 2011-12-23: 4 mg via INTRAVENOUS

## 2011-12-23 MED ORDER — ONDANSETRON HCL 4 MG PO TABS: 4.0000 mg | ORAL_TABLET | Freq: Four times a day (QID) | ORAL | Status: DC | PRN

## 2011-12-23 MED ORDER — MORPHINE SULFATE 4 MG/ML IJ SOLN
INTRAMUSCULAR | Status: DC | PRN
  Administered 2011-12-23: 4 mg

## 2011-12-23 MED ORDER — MENTHOL 3 MG MT LOZG: 1.0000 | LOZENGE | OROMUCOSAL | Status: DC | PRN

## 2011-12-23 MED ORDER — WARFARIN - PHARMACIST DOSING INPATIENT: Freq: Every day | Status: DC

## 2011-12-23 MED ORDER — METOCLOPRAMIDE HCL 10 MG PO TABS: 5.0000 mg | ORAL_TABLET | Freq: Three times a day (TID) | ORAL | Status: DC | PRN

## 2011-12-23 MED ORDER — METOCLOPRAMIDE HCL 5 MG/ML IJ SOLN: 5.0000 mg | Freq: Three times a day (TID) | INTRAMUSCULAR | Status: DC | PRN

## 2011-12-23 MED ORDER — COUMADIN BOOK
Freq: Once | Status: AC
  Administered 2011-12-23: 19:00:00
  Filled 2011-12-23: qty 1

## 2011-12-23 MED ORDER — MORPHINE SULFATE (PF) 1 MG/ML IV SOLN
INTRAVENOUS | Status: AC
  Filled 2011-12-23: qty 25

## 2011-12-23 MED ORDER — DEXAMETHASONE SODIUM PHOSPHATE 4 MG/ML IJ SOLN
INTRAMUSCULAR | Status: DC | PRN
  Administered 2011-12-23: 4 mg via INTRAVENOUS

## 2011-12-23 MED ORDER — MORPHINE SULFATE 4 MG/ML IJ SOLN
INTRAMUSCULAR | Status: AC
  Filled 2011-12-23: qty 1

## 2011-12-23 MED ORDER — OXYCODONE HCL 5 MG/5ML PO SOLN: 5.0000 mg | Freq: Once | ORAL | Status: AC | PRN

## 2011-12-23 MED ORDER — HYDROMORPHONE HCL PF 1 MG/ML IJ SOLN: 0.2500 mg | INTRAMUSCULAR | Status: DC | PRN

## 2011-12-23 MED ORDER — NALOXONE HCL 0.4 MG/ML IJ SOLN: 0.4000 mg | INTRAMUSCULAR | Status: DC | PRN

## 2011-12-23 MED ORDER — DIPHENHYDRAMINE HCL 50 MG/ML IJ SOLN: 12.5000 mg | Freq: Four times a day (QID) | INTRAMUSCULAR | Status: DC | PRN

## 2011-12-23 MED ORDER — EPHEDRINE SULFATE 50 MG/ML IJ SOLN
INTRAMUSCULAR | Status: DC | PRN
  Administered 2011-12-23: 10 mg via INTRAVENOUS

## 2011-12-23 MED ORDER — DIPHENHYDRAMINE HCL 12.5 MG/5ML PO ELIX: 12.5000 mg | ORAL_SOLUTION | Freq: Four times a day (QID) | ORAL | Status: DC | PRN

## 2011-12-23 MED ORDER — VANCOMYCIN HCL IN DEXTROSE 1-5 GM/200ML-% IV SOLN
1000.0000 mg | Freq: Two times a day (BID) | INTRAVENOUS | Status: AC
Start: 2011-12-23 — End: 2011-12-24
  Administered 2011-12-23 – 2011-12-24 (×2): 1000 mg via INTRAVENOUS
  Filled 2011-12-23 (×2): qty 200

## 2011-12-23 MED ORDER — WARFARIN SODIUM 7.5 MG PO TABS
7.5000 mg | ORAL_TABLET | Freq: Once | ORAL | Status: AC
  Administered 2011-12-23: 7.5 mg via ORAL
  Filled 2011-12-23: qty 1

## 2011-12-23 MED ORDER — ALUM & MAG HYDROXIDE-SIMETH 200-200-20 MG/5ML PO SUSP: 30.0000 mL | ORAL | Status: DC | PRN

## 2011-12-23 MED ORDER — PROMETHAZINE HCL 25 MG/ML IJ SOLN: 6.2500 mg | INTRAMUSCULAR | Status: DC | PRN

## 2011-12-23 MED ORDER — OXYCODONE HCL 5 MG PO TABS
5.0000 mg | ORAL_TABLET | Freq: Once | ORAL | Status: AC | PRN
  Administered 2011-12-23: 5 mg via ORAL

## 2011-12-23 MED ORDER — METHOCARBAMOL 100 MG/ML IJ SOLN
500.0000 mg | Freq: Four times a day (QID) | INTRAMUSCULAR | Status: DC | PRN
  Filled 2011-12-23: qty 5

## 2011-12-23 MED ORDER — METHOCARBAMOL 500 MG PO TABS
500.0000 mg | ORAL_TABLET | Freq: Four times a day (QID) | ORAL | Status: DC | PRN
  Administered 2011-12-23 – 2011-12-24 (×3): 500 mg via ORAL
  Filled 2011-12-23 (×2): qty 1

## 2011-12-23 SURGICAL SUPPLY — 56 items
BANDAGE ESMARK 6X9 LF (GAUZE/BANDAGES/DRESSINGS) ×1 IMPLANT
BLADE SAG 18X100X1.27 (BLADE) ×4 IMPLANT
BNDG CMPR 9X6 STRL LF SNTH (GAUZE/BANDAGES/DRESSINGS) ×1
BNDG ESMARK 6X9 LF (GAUZE/BANDAGES/DRESSINGS) ×2
BOOTCOVER CLEANROOM LRG (PROTECTIVE WEAR) ×4 IMPLANT
BOWL SMART MIX CTS (DISPOSABLE) ×2 IMPLANT
CEMENT BONE SIMPLEX SPEEDSET (Cement) ×4 IMPLANT
CLOTH BEACON ORANGE TIMEOUT ST (SAFETY) ×2 IMPLANT
COVER BACK TABLE 24X17X13 BIG (DRAPES) ×2 IMPLANT
COVER SURGICAL LIGHT HANDLE (MISCELLANEOUS) ×2 IMPLANT
CUFF TOURNIQUET SINGLE 34IN LL (TOURNIQUET CUFF) ×2 IMPLANT
DRAPE EXTREMITY T 121X128X90 (DRAPE) ×2 IMPLANT
DRAPE PROXIMA HALF (DRAPES) ×2 IMPLANT
DRAPE U-SHAPE 47X51 STRL (DRAPES) ×2 IMPLANT
DRSG PAD ABDOMINAL 8X10 ST (GAUZE/BANDAGES/DRESSINGS) ×2 IMPLANT
DURAPREP 26ML APPLICATOR (WOUND CARE) ×2 IMPLANT
ELECT CAUTERY BLADE 6.4 (BLADE) ×2 IMPLANT
ELECT REM PT RETURN 9FT ADLT (ELECTROSURGICAL) ×2
ELECTRODE REM PT RTRN 9FT ADLT (ELECTROSURGICAL) ×1 IMPLANT
EVACUATOR 1/8 PVC DRAIN (DRAIN) ×2 IMPLANT
FACESHIELD LNG OPTICON STERILE (SAFETY) ×2 IMPLANT
GAUZE XEROFORM 5X9 LF (GAUZE/BANDAGES/DRESSINGS) ×2 IMPLANT
GLOVE BIOGEL PI IND STRL 8 (GLOVE) ×1 IMPLANT
GLOVE BIOGEL PI INDICATOR 8 (GLOVE) ×1
GLOVE ORTHO TXT STRL SZ7.5 (GLOVE) ×2 IMPLANT
GOWN PREVENTION PLUS XLARGE (GOWN DISPOSABLE) ×4 IMPLANT
GOWN STRL NON-REIN LRG LVL3 (GOWN DISPOSABLE) ×4 IMPLANT
GOWN STRL REIN 2XL XLG LVL4 (GOWN DISPOSABLE) ×2 IMPLANT
HANDPIECE INTERPULSE COAX TIP (DISPOSABLE) ×2
IMMOBILIZER KNEE 22 UNIV (SOFTGOODS) ×2 IMPLANT
IMMOBILIZER KNEE 24 THIGH 36 (MISCELLANEOUS) IMPLANT
IMMOBILIZER KNEE 24 UNIV (MISCELLANEOUS)
KIT BASIN OR (CUSTOM PROCEDURE TRAY) ×2 IMPLANT
KIT ROOM TURNOVER OR (KITS) ×2 IMPLANT
MANIFOLD NEPTUNE II (INSTRUMENTS) ×2 IMPLANT
NS IRRIG 1000ML POUR BTL (IV SOLUTION) ×2 IMPLANT
PACK TOTAL JOINT (CUSTOM PROCEDURE TRAY) ×2 IMPLANT
PAD ARMBOARD 7.5X6 YLW CONV (MISCELLANEOUS) ×4 IMPLANT
PAD CAST 4YDX4 CTTN HI CHSV (CAST SUPPLIES) ×1 IMPLANT
PADDING CAST COTTON 4X4 STRL (CAST SUPPLIES) ×2
PADDING CAST COTTON 6X4 STRL (CAST SUPPLIES) ×2 IMPLANT
RUBBERBAND STERILE (MISCELLANEOUS) ×2 IMPLANT
SET HNDPC FAN SPRY TIP SCT (DISPOSABLE) ×1 IMPLANT
SPONGE GAUZE 4X4 12PLY (GAUZE/BANDAGES/DRESSINGS) ×2 IMPLANT
STAPLER VISISTAT 35W (STAPLE) ×2 IMPLANT
SUCTION FRAZIER TIP 10 FR DISP (SUCTIONS) ×2 IMPLANT
SUT VIC AB 1 CTX 36 (SUTURE) ×4
SUT VIC AB 1 CTX36XBRD ANBCTR (SUTURE) ×2 IMPLANT
SUT VIC AB 2-0 CT1 27 (SUTURE) ×4
SUT VIC AB 2-0 CT1 TAPERPNT 27 (SUTURE) ×2 IMPLANT
SYR 30ML LL (SYRINGE) ×2 IMPLANT
SYR 30ML SLIP (SYRINGE) ×2 IMPLANT
TOWEL OR 17X24 6PK STRL BLUE (TOWEL DISPOSABLE) ×2 IMPLANT
TOWEL OR 17X26 10 PK STRL BLUE (TOWEL DISPOSABLE) ×2 IMPLANT
TRAY FOLEY CATH 14FR (SET/KITS/TRAYS/PACK) ×2 IMPLANT
WATER STERILE IRR 1000ML POUR (IV SOLUTION) ×6 IMPLANT

## 2011-12-23 NOTE — Plan of Care (Signed)
Problem: Consults Goal: Diagnosis- Total Joint Replacement Primary Total Knee Left     

## 2011-12-23 NOTE — Op Note (Signed)
NAMETRAMANE, GORUM NO.:  [id number]  MEDICAL RECORD NO.:  [id number]  LOCATION:  5N24C                        FACILITY:  [facility]  PHYSICIAN:  Loreta Ave, M.D. DATE OF BIRTH:  [birth year]  DATE OF PROCEDURE:  12/23/2011 DATE OF DISCHARGE:                              OPERATIVE REPORT   PREOPERATIVE DIAGNOSES:  Left knee end-stage degenerative arthritis, varus alignment, but correctable.  POSTOPERATIVE DIAGNOSES:  Left knee end-stage degenerative arthritis, varus alignment, but correctable.  PROCEDURE:  Left knee modified minimally invasive total knee replacement with Stryker triathlon prosthesis.  Soft tissue balancing.  Cemented pegged posterior stabilized #6 femoral component.  Cemented #6 tibial component and 11 mm polyethylene insert.  Cemented resurfacing 38 mm patellar component.  SURGEON:  Loreta Ave, M.D.  ASSISTANT:  Genene Churn. Barry Dienes, [place], present throughout the entire case and necessary for timely completion of procedure.  ANESTHESIA:  General.  BLOOD LOSS:  Minimal.  SPECIMENS:  None.  CULTURES:  None.  COMPLICATION:  None.  DRESSINGS:  Soft compressive with a knee immobilizer.  DRAINS:  Hemovac x1.  TOURNIQUET TIME:  50 minutes.  PROCEDURE:  The patient was brought to the operating room and placed on the operating table in supine position.  After adequate anesthesia had been obtained, knee examined.  Although considerable varus, this was actually correctable, past neutral.  Full extension and flexion 90. Tourniquet applied.  Prepped and draped in usual sterile fashion. Exsanguinated with elevation, Esmarch.  Tourniquet inflated to 350 mmHg. Straight incision above the patella down to tibial tubercle.  Medial arthrotomy, vastus splitting, preserving quad tendon.  Medial capsular release.  Knee exposed.  Remnants of menisci, loose bodies, periarticular spurs, cruciate ligaments excised.  Distal femur exposed. Intramedullary  guide placed.  A 8-mm resection, 5-degree of valgus. Using epicondylar axis, the femur was sized, cut, and fitted for posterior stabilized pegged #6 component.  Proximal tibia exposed. Extramedullary guide placed.  A 3-degree posterior slope cut taken below the defect medially.  Debris cleared throughout the knee in both flexion and extension.  Patella exposed, posterior 10 mm removed.  Drilled, sized, and fitted for a 38-mm component.  Trials put in place.  #6 above and below, 11 mm insert, 38 patella.  With this construct, nicely balanced knee with good biomechanical axis.  Balance in flexion and extension.  Excellent patellofemoral tracking.  Tibia was marked for rotation and hand reamed.  All trials had been removed.  Copious irrigation with a pulse irrigating device.  Cement prepared and placed on all components, which were firmly seated.  Polyethylene attached to tibia, knee reduced.  Patella with a clamp.  Once cement hardened, the knee was irrigated once again.  Hemovac was placed and brought out through a separate stab wound.  Arthrotomy closed with 1 Vicryl.  Skin and subcutaneous tissue with Vicryl and staples.  Sterile compressive dressing applied.  Tourniquet deflated and removed.  Knee immobilizer applied.  Anesthesia reversed.  Brought to recovery room.  Tolerated surgery well.  No complications.     Loreta Ave, M.D.     DFM/MEDQ  D:  12/23/2011  T:  12/23/2011  Job:  [id number]

## 2011-12-23 NOTE — Anesthesia Postprocedure Evaluation (Signed)
  Anesthesia Post-op Note  Patient: Luke Golden  Procedure(s) Performed: Procedure(s) (LRB): TOTAL KNEE ARTHROPLASTY (Left)  Patient Location: PACU  Anesthesia Type: GA combined with regional for post-op pain  Level of Consciousness: awake  Airway and Oxygen Therapy: Patient Spontanous Breathing  Post-op Pain: mild  Post-op Assessment: Post-op Vital signs reviewed  Post-op Vital Signs: stable  Complications: No apparent anesthesia complications

## 2011-12-23 NOTE — Anesthesia Preprocedure Evaluation (Addendum)
Anesthesia Evaluation  Patient identified by MRN, date of birth, ID band Patient awake    Reviewed: Allergy & Precautions, H&P , NPO status , Patient's Chart, lab work & pertinent test results  Airway Mallampati: II TM Distance: >3 FB Neck ROM: Full    Dental  (+) Dental Advisory Given, Lower Dentures and Upper Dentures   Pulmonary shortness of breath, pneumonia -,          Cardiovascular hypertension, Pt. on home beta blockers     Neuro/Psych Anxiety    GI/Hepatic (+)     substance abuse   ,   Endo/Other    Renal/GU      Musculoskeletal   Abdominal   Peds  Hematology   Anesthesia Other Findings   Reproductive/Obstetrics                           Anesthesia Physical Anesthesia Plan  ASA: III  Anesthesia Plan: General   Post-op Pain Management:    Induction: Intravenous  Airway Management Planned: LMA  Additional Equipment:   Intra-op Plan:   Post-operative Plan: Extubation in OR  Informed Consent: I have reviewed the patients History and Physical, chart, labs and discussed the procedure including the risks, benefits and alternatives for the proposed anesthesia with the patient or authorized representative who has indicated his/her understanding and acceptance.     Plan Discussed with: CRNA and Surgeon  Anesthesia Plan Comments:         Anesthesia Quick Evaluation

## 2011-12-23 NOTE — Interval H&P Note (Signed)
History and Physical Interval Note:  12/23/2011 8:18 AM  Luke Golden  has presented today for surgery, with the diagnosis of DJD  The various methods of treatment have been discussed with the patient and family. After consideration of risks, benefits and other options for treatment, the patient has consented to  Procedure(s) (LRB): TOTAL KNEE ARTHROPLASTY (Left) as a surgical intervention .  The patient's history has been reviewed, patient examined, no change in status, stable for surgery.  I have reviewed the patient's chart and labs.  Questions were answered to the patient's satisfaction.     [name] F

## 2011-12-23 NOTE — Brief Op Note (Signed)
12/23/2011  1:32 PM  PATIENT:  Liz Malady  66 y.o. male  PRE-OPERATIVE DIAGNOSIS:  Degenerative joint disease, left knee  POST-OPERATIVE DIAGNOSIS:  Degenerative joint disease, left knee  PROCEDURE:  Procedure(s) (LRB): TOTAL KNEE ARTHROPLASTY (Left)  SURGEON:  Surgeon(s) and Role:    * Loreta Ave, MD - Primary  PHYSICIAN ASSISTANT: Zonia Kief M    SPECIMEN:  No Specimen  DISPOSITION OF SPECIMEN:  N/A  COUNTS:  YES  TOURNIQUET:   Total Tourniquet Time Documented: Thigh (Left) - 68 minutes  PATIENT DISPOSITION:  PACU - hemodynamically stable.

## 2011-12-23 NOTE — Progress Notes (Signed)
Orthopedic Tech Progress Note Patient Details:  Luke Golden 02-21-1946 [id number]  CPM Left Knee CPM Left Knee: On Left Knee Flexion (Degrees): 60  Left Knee Extension (Degrees): 0  Additional Comments: trapeze bar   Cammer, Mickie Bail 12/23/2011, 12:20 PM

## 2011-12-23 NOTE — Progress Notes (Signed)
UR COMPLETED  

## 2011-12-23 NOTE — Transfer of Care (Signed)
Immediate Anesthesia Transfer of Care Note  Patient: Luke Golden  Procedure(s) Performed: Procedure(s) (LRB): TOTAL KNEE ARTHROPLASTY (Left)  Patient Location: PACU  Anesthesia Type: GA combined with regional for post-op pain  Level of Consciousness: awake and alert   Airway & Oxygen Therapy: Patient Spontanous Breathing and Patient connected to face mask oxygen  Post-op Assessment: Report given to PACU RN and Post -op Vital signs reviewed and stable  Post vital signs: Reviewed and stable  Complications: No apparent anesthesia complications

## 2011-12-23 NOTE — Preoperative (Signed)
Beta Blockers   Reason not to administer Beta Blockers:Took lopressor 6/23 at 9 pm

## 2011-12-23 NOTE — Anesthesia Procedure Notes (Signed)
Procedure Name: LMA Insertion Date/Time: 12/23/2011 8:41 AM Performed by: Caren Macadam Pre-anesthesia Checklist: Patient identified, Emergency Drugs available, Suction available and Patient being monitored Patient Re-evaluated:Patient Re-evaluated prior to inductionOxygen Delivery Method: Circle System Utilized Preoxygenation: Pre-oxygenation with 100% oxygen Intubation Type: IV induction Ventilation: Mask ventilation without difficulty LMA: LMA inserted LMA Size: 4.0 Number of attempts: 1 Airway Equipment and Method: bite block Placement Confirmation: positive ETCO2 Tube secured with: Tape Dental Injury: Teeth and Oropharynx as per pre-operative assessment
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
## 2011-12-24 ENCOUNTER — Encounter (HOSPITAL_COMMUNITY): Payer: Self-pay | Admitting: Orthopedic Surgery

## 2011-12-24 LAB — BASIC METABOLIC PANEL
BUN: 12 mg/dL (ref 6–23)
Calcium: 8.9 mg/dL (ref 8.4–10.5)
Creatinine, Ser: 0.95 mg/dL (ref 0.50–1.35)
GFR calc Af Amer: >90 mL/min (ref >90)
GFR calc non Af Amer: 85 mL/min — ABNORMAL LOW (ref >90)

## 2011-12-24 LAB — CBC
HCT: 33.8 % — ABNORMAL LOW (ref 39.0–52.0)
MCH: 31.6 pg (ref 26.0–34.0)
MCHC: 33.4 g/dL (ref 30.0–36.0)
MCV: 94.4 fL (ref 78.0–100.0)
Platelets: 131 10*3/uL — ABNORMAL LOW (ref 150–400)
RDW: 12.7 % (ref 11.5–15.5)
WBC: 7.5 10*3/uL (ref 4.0–10.5)

## 2011-12-24 MED ORDER — OXYCODONE HCL 5 MG PO TABS
10.0000 mg | ORAL_TABLET | ORAL | Status: DC | PRN
  Administered 2011-12-24 – 2011-12-25 (×4): 10 mg via ORAL
  Filled 2011-12-24 (×5): qty 2

## 2011-12-24 MED ORDER — WARFARIN SODIUM 7.5 MG PO TABS
7.5000 mg | ORAL_TABLET | Freq: Once | ORAL | Status: AC
  Administered 2011-12-24: 7.5 mg via ORAL
  Filled 2011-12-24: qty 1

## 2011-12-24 MED ORDER — OXYCODONE HCL 5 MG PO TABS
5.0000 mg | ORAL_TABLET | ORAL | Status: DC | PRN
  Administered 2011-12-24: 5 mg via ORAL
  Filled 2011-12-24: qty 1

## 2011-12-24 MED ORDER — LORAZEPAM 0.5 MG PO TABS
0.5000 mg | ORAL_TABLET | Freq: Four times a day (QID) | ORAL | Status: AC | PRN
  Administered 2011-12-24: 1 mg via ORAL
  Administered 2011-12-25: 0.5 mg via ORAL
  Filled 2011-12-24: qty 2
  Filled 2011-12-24: qty 1

## 2011-12-24 NOTE — Progress Notes (Signed)
Referral received for SNF. Chart reviewed and CSW has spoken with RNCM who indicates that patient is for DC to home with Home Health and DME.  CSW to sign off. Please re-consult if CSW needs arise.  Janos Shampine T. Koby Hartfield, LCSWA  [phone]  

## 2011-12-24 NOTE — Progress Notes (Signed)
ANTICOAGULATION CONSULT NOTE - Follow Up Consult  Pharmacy Consult for warfarin Indication: VTE prophylaxis  Allergies  Allergen Reactions  . Cephalosporins     Hives   . Tylenol (Acetaminophen)     Feel weird      Patient Measurements:     Vital Signs: Temp: 97.3 F (36.3 C) (07/25 0651) BP: 151/65 mmHg (07/25 0651) Pulse Rate: 96  (07/25 0651)  Labs:  Basename 12/24/11 0643  HGB 11.3*  HCT 33.8*  PLT 131*  APTT --  LABPROT 16.2*  INR 1.27  HEPARINUNFRC --  CREATININE 0.95  CKTOTAL --  CKMB --  TROPONINI --    CrCl is unknown because there is no height on file for the current visit.   Medications:  Prescriptions prior to admission  Medication Sig Dispense Refill  . aspirin 81 MG tablet Take 81 mg by mouth daily.      . busPIRone (BUSPAR) 10 MG tablet Take 10 mg by mouth 2 (two) times daily. 1/2 pill daytime, 1 tablet at night      . docusate sodium (COLACE) 100 MG capsule Take 100 mg by mouth 2 (two) times daily.      Marland Kitchen GLUCOSAMINE PO Take 1 tablet by mouth daily. Hold while in hospital      . ibuprofen (ADVIL,MOTRIN) 800 MG tablet Take 800 mg by mouth every 8 (eight) hours as needed. For pain      . lisinopril (PRINIVIL,ZESTRIL) 2.5 MG tablet Take 2.5 mg by mouth at bedtime.       . lovastatin (MEVACOR) 20 MG tablet Take 20 mg by mouth at bedtime.       . metoprolol tartrate (LOPRESSOR) 25 MG tablet Take 25 mg by mouth at bedtime.       . vitamin B-12 (CYANOCOBALAMIN) 500 MCG tablet Take 500 mcg by mouth every morning.        Assessment: 65 y/o male with end stage DJD, s/p TKA today. To start Coumadin for VTE prophylaxis with Lovenox 30 mg q12h bridge. Coumadin score = 6, INR rose from 0.96 up to 1.27 after one 7.5mg  dose last night. Hg dropped from 14.2 to 11.3 postop. Some minor bleeding through dressings noted.   Goal of Therapy:  INR 2-3 Monitor platelets by anticoagulation protocol: Yes   Plan:  1. Coumadin 7.5 mg x 1 tonight.  2. Daily INR    3. Continue Lovenox 30 mg q12h.    Vania Rea. Darin Engels.D. Clinical Pharmacist Pager [phone] Phone [phone] 12/24/2011 2:47 PM

## 2011-12-24 NOTE — Progress Notes (Signed)
Subjective: Doing well.  Pain controlled.  Therapist reported patient feeling dizzy with ambulation today.     Objective: Vital signs in last 24 hours: Temp:  [97.3 F (36.3 C)-98.9 F (37.2 C)] 97.3 F (36.3 C) (07/25 0651) Pulse Rate:  [62-96] 96  (07/25 0651) Resp:  [12-21] 21  (07/25 0651) BP: (107-151)/(46-84) 151/65 mmHg (07/25 0651) SpO2:  [92 %-100 %] 92 % (07/25 0651)  Intake/Output from previous day: 07/24 0701 - 07/25 0700 In: 1840 [P.O.:240; I.V.:1600] Out: 6375 [Urine:5675; Drains:650; Blood:50] Intake/Output this shift:     Basename 12/24/11 0643  HGB 11.3*    Basename 12/24/11 0643  WBC 7.5  RBC 3.58*  HCT 33.8*  PLT 131*    Basename 12/24/11 0643  NA 140  K 4.1  CL 104  CO2 28  BUN 12  CREATININE 0.95  GLUCOSE 131*  CALCIUM 8.9    Basename 12/24/11 0643  LABPT --  INR 1.27    Exam:  Some bleeding thru dressing.  Calf nt, nvi.    Assessment/Plan: D/c pca now.  Will try oxycodone.  Cannot tolerate tylenol.   Anticipate d/c home Friday if makes good progress with therapy.     Luke Golden M 12/24/2011, 10:39 AM

## 2011-12-24 NOTE — Evaluation (Signed)
Physical Therapy Evaluation Patient Details Name: Luke Golden MRN: [id number] DOB: 10-02-1945 Today's Date: 12/24/2011 Time: 0905-1009 PT Time Calculation (min): 64 min  PT Assessment / Plan / Recommendation Clinical Impression  Pt is a 66 y/o male admitted for L TKA.  Pt lives with spouse and daughter. Today pt mobility limited by c/o worsening dizziness in standing.  Pt is highly motivated to go home (tomorrow).   Acute PT to follow pt.  Suggesting D/C plan for home with HHPT.     PT Assessment  Patient needs continued PT services    Follow Up Recommendations  Home health PT;Supervision - Intermittent    Barriers to Discharge None      Equipment Recommendations  Rolling walker with 5" wheels    Recommendations for Other Services     Frequency 7X/week    Precautions / Restrictions Precautions Precautions: Knee Precaution Booklet Issued: Yes (comment) Precaution Comments: Pt is impulsive- overly motivated.  Required Braces or Orthoses: Knee Immobilizer - Left Knee Immobilizer - Left: On at all times Restrictions Weight Bearing Restrictions: Yes LLE Weight Bearing: Weight bearing as tolerated   Pertinent Vitals/Pain 4/10 pain in L knee. Pt has PCA and chose not to use it.       Mobility  Bed Mobility Bed Mobility: Supine to Sit;Sitting - Scoot to Edge of Bed Supine to Sit: 4: Min assist;HOB flat;With rails Sitting - Scoot to Edge of Bed: 4: Min assist Details for Bed Mobility Assistance: Assist for L LE secondary to pain and weakness, Cueing for technique.  Transfers Transfers: Sit to Stand;Stand to Sit Sit to Stand: 4: Min assist;With upper extremity assist;From chair/3-in-1 Stand to Sit: 4: Min assist;With upper extremity assist;To chair/3-in-1 Details for Transfer Assistance: min verbal cues for hand placement and L LE extended in front Ambulation/Gait Ambulation/Gait Assistance: 4: Min assist Ambulation Distance (Feet): 10 Feet Assistive device: Rolling  walker Ambulation/Gait Assistance Details: Cueing for gait sequencing,and WBAT on L LE.   Assist for walker management with change of direction and cueing for safe distancing of pt from walker (pt gets too close).   Gait Pattern: Step-to pattern;Decreased stride length;Decreased stance time - left Stairs: No Wheelchair Mobility Wheelchair Mobility: No Modified Rankin (Stroke Patients Only) Pre-Morbid Rankin Score: No significant disability    Exercises Total Joint Exercises Ankle Circles/Pumps: AROM;Both;10 reps;Supine Quad Sets: 5 reps;Left;Supine Straight Leg Raises: Left;Strengthening;5 reps;Supine Goniometric ROM: 0-100 degrees L knee AAROM   PT Diagnosis: Difficulty walking;Generalized weakness;Acute pain  PT Problem List: Decreased strength;Decreased activity tolerance;Decreased balance;Decreased mobility;Decreased safety awareness;Decreased coordination;Obesity;Pain;Decreased knowledge of precautions;Decreased knowledge of use of DME;Decreased range of motion PT Treatment Interventions: DME instruction;Gait training;Functional mobility training;Stair training;Therapeutic activities;Therapeutic exercise;Balance training;Neuromuscular re-education;Patient/family education   PT Goals Acute Rehab PT Goals PT Goal Formulation: With patient/family Time For Goal Achievement: 01/07/12 Potential to Achieve Goals: Good Pt will go Supine/Side to Sit: with modified independence;with HOB 0 degrees PT Goal: Supine/Side to Sit - Progress: Goal set today Pt will go Sit to Supine/Side: with modified independence;with HOB 0 degrees PT Goal: Sit to Supine/Side - Progress: Goal set today Pt will go Sit to Stand: with supervision PT Goal: Sit to Stand - Progress: Goal set today Pt will go Stand to Sit: with supervision;with upper extremity assist PT Goal: Stand to Sit - Progress: Goal set today Pt will Transfer Bed to Chair/Chair to Bed: with supervision PT Transfer Goal: Bed to Chair/Chair to Bed  - Progress: Goal set today Pt will Ambulate: 51 - 150 feet;with  supervision;with rolling walker PT Goal: Ambulate - Progress: Goal set today Pt will Perform Home Exercise Program: Independently PT Goal: Perform Home Exercise Program - Progress: Goal set today  Visit Information  Last PT Received On: 12/24/11 Assistance Needed: +1    Subjective Data  Subjective: How soon can you get me out of here. Patient Stated Goal: Go home as soon as possible    Prior Functioning  Home Living Lives With: Spouse;Daughter Available Help at Discharge: Family;Available 24 hours/day Type of Home: House Home Access: Stairs to enter [employer] of Steps: 4 Entrance Stairs-Rails: Can reach both Home Layout: One level Bathroom Shower/Tub: Tub/shower unit;Curtain Firefighter: Standard Bathroom Accessibility: Yes Home Adaptive Equipment: Bedside commode/3-in-1;Grab bars in shower;Hand-held shower hose;Long-handled shoehorn;Long-handled sponge;Straight cane;Walker - standard;Reacher Additional Comments: pt interested in getting sock aid.  pt states he is already in the process of getting a tubseat but he isnt sure if it is a seat or transfer bench. Prior Function Level of Independence: Independent Able to Take Stairs?: Yes Communication Communication: No difficulties    Cognition  Overall Cognitive Status: Appears within functional limits for tasks assessed/performed Arousal/Alertness: Awake/alert Orientation Level: Appears intact for tasks assessed Behavior During Session: [facility] for tasks performed    Extremity/Trunk Assessment Right Upper Extremity Assessment RUE ROM/Strength/Tone: [facility] for tasks assessed Left Upper Extremity Assessment LUE ROM/Strength/Tone: WFL for tasks assessed Right Lower Extremity Assessment RLE ROM/Strength/Tone: [facility] for tasks assessed Left Lower Extremity Assessment LLE ROM/Strength/Tone: Deficits LLE ROM/Strength/Tone Deficits: Gross hip and knee strength  3-/5, AAROM Knee 0-100 degrees.  Trunk Assessment Trunk Assessment: Normal   Balance Balance Balance Assessed: Yes Static Sitting Balance Static Sitting - Balance Support: Bilateral upper extremity supported;Feet supported Static Sitting - Level of Assistance: 5: Stand by assistance Static Sitting - Comment/# of Minutes: 5+minutes with no LOB on EOB Dynamic Standing Balance Dynamic Standing - Level of Assistance: 4: Min assist;Other (comment) (to use urinal)  End of Session PT - End of Session Equipment Utilized During Treatment: Gait belt;Left knee immobilizer Activity Tolerance: Patient limited by fatigue;Treatment limited secondary to medical complications (Comment) (c/o worsening dizziness in standing. ) Patient left: in chair;with call bell/phone within reach;with family/visitor present Nurse Communication: Mobility status CPM Left Knee CPM Left Knee: Off  GP     Karaline Buresh 12/24/2011, 12:38 PM  Aurel Nguyen L. Tamarion Haymond DPT [phone]

## 2011-12-24 NOTE — Progress Notes (Signed)
CARE MANAGEMENT NOTE 12/24/2011 Comments:  12/24/11  1100 Vance Peper, RN BSN Received Call from Donia Pounds, authorized to get walker from [employer]. Home Health was preauthorized.

## 2011-12-24 NOTE — Evaluation (Signed)
Occupational Therapy Evaluation Patient Details Name: Luke Golden MRN: [id number] DOB: [birth year] Today's Date: 12/24/2011 Time: 1140-1210 OT Time Calculation (min): 30 min  OT Assessment / Plan / Recommendation Clinical Impression  pt is 66 yo s/p L TKA and displays decreased activity tolerance, decreased strength and will benefit from skilled OT services to improve ADL independence for discharge home with family.    OT Assessment  Patient needs continued OT Services    Follow Up Recommendations  Home health OT    Barriers to Discharge      Equipment Recommendations  Rolling walker with 5" wheels    Recommendations for Other Services    Frequency  Min 2X/week    Precautions / Restrictions Precautions Precautions: Knee Required Braces or Orthoses: Knee Immobilizer - Left Knee Immobilizer - Left: On at all times Restrictions LLE Weight Bearing: Weight bearing as tolerated        ADL  Eating/Feeding: Simulated;Independent Where Assessed - Eating/Feeding: Chair Grooming: Simulated;Set up Where Assessed - Grooming: Supported sitting Upper Body Bathing: Simulated;Chest;Right arm;Left arm;Abdomen;Supervision/safety;Set up Where Assessed - Upper Body Bathing: Unsupported sitting Lower Body Bathing: Simulated;Minimal assistance Where Assessed - Lower Body Bathing: Supported sit to stand Upper Body Dressing: Simulated;Set up;Supervision/safety Where Assessed - Upper Body Dressing: Unsupported sitting Lower Body Dressing: Simulated;Moderate assistance Where Assessed - Lower Body Dressing: Supported sit to stand Toilet Transfer: Mining engineer Method: Sit to stand;Other (comment) (to use urinal. see notes below) Toileting - Clothing Manipulation and Hygiene: Simulated;Minimal assistance Where Assessed - Toileting Clothing Manipulation and Hygiene: Sit to stand from 3-in-1 or toilet Tub/Shower Transfer Method: Not assessed Equipment Used:  Rolling walker ADL Comments: Pt slightly dizzy upon standing. Did not attempt transfer into bathroom but pt wanting to use urinal so stood for about 2 minutes to use urinal. Min assist for balance support. Pt with dizziness improved once sitting. Offered to work on bathing task for eval but pt stating he is a private person and prefers for wife to assist.     OT Diagnosis: Generalized weakness  OT Problem List: Decreased strength;Decreased activity tolerance;Decreased knowledge of use of DME or AE OT Treatment Interventions: Self-care/ADL training;Therapeutic activities;DME and/or AE instruction;Patient/family education   OT Goals Acute Rehab OT Goals OT Goal Formulation: With patient Time For Goal Achievement: 12/31/11 Potential to Achieve Goals: Good ADL Goals Pt Will Perform Grooming: with supervision;Standing at sink ADL Goal: Grooming - Progress: Goal set today Pt Will Perform Lower Body Bathing: with supervision;Sit to stand from chair;Sit to stand from bed;with adaptive equipment ADL Goal: Lower Body Bathing - Progress: Goal set today Pt Will Perform Lower Body Dressing: with supervision;Sit to stand from chair;Sit to stand from bed;with adaptive equipment ADL Goal: Lower Body Dressing - Progress: Goal set today Pt Will Transfer to Toilet: with supervision;Ambulation;with DME;3-in-1 ADL Goal: Toilet Transfer - Progress: Goal set today Pt Will Perform Toileting - Clothing Manipulation: with supervision;Standing ADL Goal: Toileting - Clothing Manipulation - Progress: Goal set today Pt Will Perform Tub/Shower Transfer: with min assist;with DME;Other (comment) (depending on if he has tubbench or chair. or HH to address) ADL Goal: Tub/Shower Transfer - Progress: Goal set today  Visit Information  Last OT Received On: 12/24/11 Assistance Needed: +1    Subjective Data  Subjective: I am gonig to let my wife help wash me up. Patient Stated Goal: to be independent   Prior  Functioning  Vision/Perception  Home Living Lives With: Spouse;Daughter Available Help at Discharge: Family;Available 24 hours/day Type  of Home: House Home Access: Stairs to enter [employer] of Steps: 4 Entrance Stairs-Rails: Can reach both Home Layout: One level Bathroom Shower/Tub: Tub/shower unit;Curtain Firefighter: Standard Bathroom Accessibility: Yes Home Adaptive Equipment: Bedside commode/3-in-1;Grab bars in shower;Hand-held shower hose;Long-handled shoehorn;Long-handled sponge;Straight cane;Walker - standard;Reacher Additional Comments: pt interested in getting sock aid.  pt states he is already in the process of getting a tubseat but he isnt sure if it is a seat or transfer bench. Prior Function Level of Independence: Independent Able to Take Stairs?: Yes Communication Communication: No difficulties      Cognition  Overall Cognitive Status: Appears within functional limits for tasks assessed/performed Arousal/Alertness: Awake/alert Orientation Level: Appears intact for tasks assessed Behavior During Session: [facility] for tasks performed    Extremity/Trunk Assessment Right Upper Extremity Assessment RUE ROM/Strength/Tone: [facility] for tasks assessed Left Upper Extremity Assessment LUE ROM/Strength/Tone: WFL for tasks assessed   Mobility Transfers Transfers: Sit to Stand;Stand to Sit Sit to Stand: 4: Min assist;With upper extremity assist;From chair/3-in-1 Stand to Sit: 4: Min assist;With upper extremity assist;To chair/3-in-1 Details for Transfer Assistance: min verbal cues for hand placement and L LE extended in front   Exercise    Balance Balance Balance Assessed: Yes Dynamic Standing Balance Dynamic Standing - Level of Assistance: 4: Min assist;Other (comment) (to use urinal)  End of Session OT - End of Session Activity Tolerance: Other (comment) (some dizziness) Patient left: in chair;with call bell/phone within reach  GO     Lennox Laity [phone] 12/24/2011, 12:32 PM

## 2011-12-24 NOTE — Progress Notes (Signed)
Physical Therapy Treatment Patient Details Name: Luke Golden MRN: [id number] DOB: 19-Feb-1946 Today's Date: 12/24/2011 Time: 4696-2952 PT Time Calculation (min): 38 min  PT Assessment / Plan / Recommendation Comments on Treatment Session  Pt on the phone with MD complaining of unbearable pain upon PT arrival. PT inteventions include positioning L LE for pain relief, Manual joint mobilizations (a-p glides with distraction) and application of ice to knee. Pt demostrates repeated episodes of stuttering, muttering, and shivering.  Pt and familiy instructed to recognize these as signs of pt tensing his muscles and instructed pt to relax and perform therapeutic pursed lip breathing while allowing shouders to relax.  Pt pain much improved by end of session and pt resting comfortably.  Pt had episode of vomitting during session shortly after taking muscle relaxer.  No pill visible in vomit.  Notified RN. Instructed pt to keep ice on for one hour than have Nursing place him in CPM;  Decreased flexion on CPM to 50 degrees to minimize pain for tonight.      Follow Up Recommendations  Home health PT;Supervision - Intermittent    Barriers to Discharge        Equipment Recommendations  Rolling walker with 5" wheels    Recommendations for Other Services    Frequency 7X/week   Plan Discharge plan remains appropriate;Frequency remains appropriate    Precautions / Restrictions Precautions Precautions: Knee Required Braces or Orthoses: Knee Immobilizer - Left Knee Immobilizer - Left: On except when in CPM Restrictions Weight Bearing Restrictions: Yes LLE Weight Bearing: Weight bearing as tolerated   Pertinent Vitals/Pain Pain 10/10 in L Knee. See comments on treatment session for interventions.     Mobility  Bed Mobility Bed Mobility: Not assessed Transfers Transfers: Not assessed Ambulation/Gait Ambulation/Gait Assistance: Not tested (comment) Wheelchair Mobility Wheelchair Mobility: No      Exercises     PT Diagnosis:    PT Problem List:   PT Treatment Interventions:     PT Goals Acute Rehab PT Goals PT Goal Formulation: With patient/family Time For Goal Achievement: 01/07/12 Potential to Achieve Goals: Good Additional Goals Additional Goal #1: Pt will report pain consistently no greater than 4/10 and demonstrate pain relief  with min assist of spouse.  PT Goal: Additional Goal #1 - Progress: Goal set today  Visit Information  Last PT Received On: 12/24/11 Assistance Needed: +1    Subjective Data  Subjective: I cant stand this pain. Patient Stated Goal: Get some relief from this pain   Cognition  Overall Cognitive Status: Appears within functional limits for tasks assessed/performed Arousal/Alertness: Awake/alert Orientation Level: Appears intact for tasks assessed Behavior During Session: Anxious    Balance  Balance Balance Assessed: No  End of Session     GP     Luke Golden 12/24/2011, 5:53 PM Luke Golden DPT [phone]

## 2011-12-24 NOTE — Progress Notes (Signed)
On my rounds at shift change patient begins stating his leg has "ripped" and he fell. Wife informs me and the patient that he did not fall, but instead the nurse tech had to firmly grab and hold the patient upon transfer from the bedside commode because the wife was trying to do it without assistance. I examined the patients knee, which he states was bleeding from his fall. However,day shift RN informed me he has had bloody drainage on his ted hose all day. Pt is anxious, noncompliant, and agitated and demanding that he see his doctor tonight. I have placed a call to the on call PA to get medication for anxiety, per wifes request.

## 2011-12-24 NOTE — Progress Notes (Signed)
CARE MANAGEMENT NOTE 12/24/2011  Patient:  Luke Golden, Luke Golden   Account Number:  [id number]  Date Initiated:  12/24/2011  Documentation initiated by:  Vance Peper  Subjective/Objective Assessment:   66 yr old male s/p left total knee arthroplasty     Action/Plan:   CM spoke with patient regarding home health needs at discharge. Choice offered. Pateint preoperatively setup with [facility], no changes. Has CPM, 3in1, needs rolling walker.   Anticipated DC Date:  12/25/2011   Anticipated DC Plan:  HOME W HOME HEALTH SERVICES      DC Planning Services  CM consult      [facility] Choice  HOME HEALTH  DURABLE MEDICAL EQUIPMENT   Choice offered to / List presented to:  C-1 Patient   DME arranged  [facility]      DME agency  [employer]  [employer]     HH arranged  HH-2 PT      [facility] agency  [facility]   Status of service:  In process, will continue to follow Medicare Important Message given?   (If response is "NO", the following Medicare IM given date fields will be blank) Date Medicare IM given:   Date Additional Medicare IM given:    Discharge Disposition:    Per UR Regulation:    If discussed at Long Length of Stay Meetings, dates discussed:    Comments:  12/24/11 1000 Vance Peper, RN BSN Case Manager-[phone] Contacting [facility] to confirm authorization for Home Health. Calling Donia Pounds- [phone] ext [phone]

## 2011-12-25 LAB — CBC
MCH: 31.8 pg (ref 26.0–34.0)
MCHC: 34.1 g/dL (ref 30.0–36.0)
MCV: 93.1 fL (ref 78.0–100.0)
Platelets: 125 10*3/uL — ABNORMAL LOW (ref 150–400)
RBC: 3.18 MIL/uL — ABNORMAL LOW (ref 4.22–5.81)
RDW: 12.8 % (ref 11.5–15.5)

## 2011-12-25 LAB — BASIC METABOLIC PANEL
CO2: 28 mEq/L (ref 19–32)
Calcium: 8.5 mg/dL (ref 8.4–10.5)
Creatinine, Ser: 0.87 mg/dL (ref 0.50–1.35)
GFR calc non Af Amer: 88 mL/min — ABNORMAL LOW (ref >90)
Sodium: 140 mEq/L (ref 135–145)

## 2011-12-25 LAB — PROTIME-INR: Prothrombin Time: 17 seconds — ABNORMAL HIGH (ref 11.6–15.2)

## 2011-12-25 MED ORDER — WARFARIN SODIUM 5 MG PO TABS: 5.0000 mg | ORAL_TABLET | Freq: Every day | ORAL | Status: DC

## 2011-12-25 MED ORDER — METHOCARBAMOL 500 MG PO TABS: 500.0000 mg | ORAL_TABLET | Freq: Four times a day (QID) | ORAL | Status: AC | PRN

## 2011-12-25 MED ORDER — OXYCODONE HCL 10 MG PO TABS: ORAL_TABLET | ORAL | Status: DC

## 2011-12-25 MED ORDER — WARFARIN SODIUM 7.5 MG PO TABS
7.5000 mg | ORAL_TABLET | Freq: Once | ORAL | Status: AC
  Administered 2011-12-25: 7.5 mg via ORAL
  Filled 2011-12-25: qty 1

## 2011-12-25 MED ORDER — ENOXAPARIN SODIUM 30 MG/0.3ML ~~LOC~~ SOLN: 30.0000 mg | Freq: Two times a day (BID) | SUBCUTANEOUS | Status: DC

## 2011-12-25 NOTE — Progress Notes (Signed)
ANTICOAGULATION CONSULT NOTE - Follow Up Consult  Pharmacy Consult for Coumadin Indication: VTE prophylaxis  Allergies  Allergen Reactions  . Cephalosporins     Hives   . Tylenol (Acetaminophen)     Feel weird      Patient Measurements: Height: 5' 8.11" (173 cm) Weight: 205 lb 11 oz (93.3 kg) IBW/kg (Calculated) : 68.65   Vital Signs: Temp: 99.9 F (37.7 C) (07/26 0627) Temp src: Oral (07/26 0627) BP: 128/50 mmHg (07/26 0627) Pulse Rate: 84  (07/26 0627)  Labs:  Basename 12/25/11 0545 12/24/11 0643  HGB 10.1* 11.3*  HCT 29.6* 33.8*  PLT 125* 131*  APTT -- --  LABPROT 17.0* 16.2*  INR 1.36 1.27  HEPARINUNFRC -- --  CREATININE 0.87 0.95  CKTOTAL -- --  CKMB -- --  TROPONINI -- --    Estimated Creatinine Clearance: 92.7 ml/min (by C-G formula based on Cr of 0.87).   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 Pt on Coumadin for VTE ppx. BL INR 0.96. INR 1.36 < 1.26 after 2 doses of 7.5mg . Bridging with Lovenox 30 mg q12h, d/c when INR >/= 1.8.   Hg drop postop 14.2, postop 10.1 < 11.3, plts 125 < 131. Hemovac drain removed today. Wound healing well per RN.    Goal of Therapy:  INR 2-3 Monitor platelets by anticoagulation protocol: Yes  Thank you for the consult.    Plan:  - Coumadin 7.5 mg x 1 tonight. - Daily INR - F/u CBC, s/sx bleeding - Continue Lovenox 30 mg q12 until INR >/= 1.8  Tomi Bamberger, PharmD Clinical Pharmacist Pager: [phone] Pharmacy: [phone] 12/25/2011 1:48 PM

## 2011-12-25 NOTE — Progress Notes (Signed)
Subjective: Pain better controlled today.  Slow progress with therapy.     Objective: Vital signs in last 24 hours: Temp:  [99.9 F (37.7 C)-100.6 F (38.1 C)] 99.9 F (37.7 C) (07/26 0627) Pulse Rate:  [84-98] 84  (07/26 0627) Resp:  [16-18] 18  (07/26 0627) BP: (99-146)/(46-71) 128/50 mmHg (07/26 0627) SpO2:  [97 %-100 %] 97 % (07/26 0627) Weight:  [93.3 kg (205 lb 11 oz)] 93.3 kg (205 lb 11 oz) (07/26 1100)  Intake/Output from previous day: 07/25 0701 - 07/26 0700 In: 3828 [P.O.:240; I.V.:3588] Out: 2250 [Urine:1875; Drains:375] Intake/Output this shift:     Basename 12/25/11 0545 12/24/11 0643  HGB 10.1* 11.3*    Basename 12/25/11 0545 12/24/11 0643  WBC 9.2 7.5  RBC 3.18* 3.58*  HCT 29.6* 33.8*  PLT 125* 131*    Basename 12/25/11 0545 12/24/11 0643  NA 140 140  K 4.0 4.1  CL 105 104  CO2 28 28  BUN 11 12  CREATININE 0.87 0.95  GLUCOSE 115* 131*  CALCIUM 8.5 8.9    Basename 12/25/11 0545 12/24/11 0643  LABPT -- --  INR 1.36 1.27    Wound looks good.  Staples intact.  No drainage or signs of infection.  hemovac removed.   Assessment/Plan: Possible d/c home today or Saturday if makes better progress with therapy.  Saline lock iv.  Question needing snf.     Birtha Hatler M 12/25/2011, 12:41 PM

## 2011-12-25 NOTE — Progress Notes (Signed)
0910 Pt found  Standing on side of bed holding walker stated Dr told him to walk.Informed pt that he is not to get up oob without calling for assistance and when he is oob must have brace on states he understood.

## 2011-12-25 NOTE — Progress Notes (Signed)
Physical Therapy Treatment Patient Details Name: Luke Golden MRN: [id number] DOB: [birth year] Today's Date: 12/25/2011 Time: 1430-1500 PT Time Calculation (min): 30 min  PT Assessment / Plan / Recommendation Comments on Treatment Session  Spoke with pt and spouse at length about discharge plan. Both are comfortable with pt discharge to home tomorrow.  Acute PT will see once more to progress HEP.  Pt required repeated instruction to avoid "over doing it " with exercise and ambulation.  Pt excessively motivated.      Follow Up Recommendations  Home health PT;Supervision - Intermittent    Barriers to Discharge        Equipment Recommendations  Rolling walker with 5" wheels    Recommendations for Other Services    Frequency 7X/week   Plan Discharge plan remains appropriate;Frequency remains appropriate    Precautions / Restrictions Precautions Precautions: Knee Required Braces or Orthoses: Knee Immobilizer - Left Knee Immobilizer - Left: On except when in CPM   Pertinent Vitals/Pain 4/10 pain with activity in L knee. No pain intervention required.     Mobility  Bed Mobility Bed Mobility: Not assessed Transfers Transfers: Sit to Stand;Stand to Sit Sit to Stand: 5: Supervision;From bed;From chair/3-in-1;With upper extremity assist Stand to Sit: 5: Supervision;With upper extremity assist;To bed;To chair/3-in-1 Ambulation/Gait Ambulation/Gait Assistance: 5: Supervision Ambulation Distance (Feet): 150 Feet Assistive device: Rolling walker Gait Pattern: Step-through pattern;Decreased stride length Stairs: Yes Stairs Assistance: 6: Modified independent (Device/Increase time) Stair Management Technique: Two rails Number of Stairs: 3  Wheelchair Mobility Wheelchair Mobility: No    Exercises     PT Diagnosis:    PT Problem List:   PT Treatment Interventions:     PT Goals Acute Rehab PT Goals PT Goal Formulation: With patient/family Time For Goal Achievement:  01/07/12 Potential to Achieve Goals: Good Pt will go Supine/Side to Sit: with modified independence;with HOB 0 degrees PT Goal: Supine/Side to Sit - Progress: Met Pt will go Sit to Supine/Side: with modified independence;with HOB 0 degrees PT Goal: Sit to Supine/Side - Progress: Progressing toward goal Pt will go Sit to Stand: with supervision PT Goal: Sit to Stand - Progress: Met Pt will go Stand to Sit: with supervision PT Goal: Stand to Sit - Progress: Met Pt will Transfer Bed to Chair/Chair to Bed: with supervision PT Transfer Goal: Bed to Chair/Chair to Bed - Progress: Met Pt will Ambulate: 51 - 150 feet;with supervision;with rolling walker PT Goal: Ambulate - Progress: Met Pt will Perform Home Exercise Program: Independently PT Goal: Perform Home Exercise Program - Progress: Not met Additional Goals Additional Goal #1: Pt will report pain consistently no greater than 4/10 and demonstrate pain relief  with min assist of spouse.  PT Goal: Additional Goal #1 - Progress: Met  Visit Information  Last PT Received On: 12/25/11 Assistance Needed: +1    Subjective Data  Subjective: Spouse reports pt ambulated in hall with nsg this afternoon.  Patient Stated Goal: Go home    Cognition  Overall Cognitive Status: Appears within functional limits for tasks assessed/performed Arousal/Alertness: Awake/alert Orientation Level: Appears intact for tasks assessed Behavior During Session: Anxious    Balance  Balance Balance Assessed: No  End of Session PT - End of Session Equipment Utilized During Treatment: Gait belt;Left knee immobilizer Activity Tolerance: Patient tolerated treatment well Patient left: in chair;with call bell/phone within reach Nurse Communication: Mobility status   GP     Indria Bishara 12/25/2011, 4:14 PM Valentin Benney L. Corra Kaine DPT [phone]

## 2011-12-25 NOTE — Progress Notes (Signed)
PT PROGRESS NOTE:   12/25/11 0900  PT Visit Information  Last PT Received On 12/25/11  Assistance Needed +1  PT Time Calculation  PT Start Time 0900  PT Stop Time 0936  PT Time Calculation (min) 36 min  Subjective Data  Subjective RN and wife report pt trying to get oob without assistance multiple times last night.  Wife concerned about taking pt home.    Patient Stated Goal Go home   Precautions  Precautions Knee  Required Braces or Orthoses Knee Immobilizer - Left  Knee Immobilizer - Left On except when in CPM  Restrictions  Weight Bearing Restrictions Yes  LLE Weight Bearing WBAT  Cognition  Overall Cognitive Status Appears within functional limits for tasks assessed/performed  Arousal/Alertness Awake/alert  Orientation Level Appears intact for tasks assessed  Behavior During Session Anxious  Bed Mobility  Bed Mobility Supine to Sit;Sit to Supine  Supine to Sit 5: Supervision  Sitting - Scoot to Edge of Bed 5: Supervision  Sit to Supine 5: Supervision  Details for Bed Mobility Assistance Cueing for use of Leg lifter to manage L LE.    Transfers  Transfers Sit to Stand;Stand to Sit  Sit to Stand 4: Min guard;From bed;With upper extremity assist  Stand to Sit 4: Min guard;To bed;To chair/3-in-1;To elevated surface;With upper extremity assist  Details for Transfer Assistance Cueing for L LE positioning to minimize pain.    Ambulation/Gait  Ambulation/Gait Assistance 4: Min guard  Ambulation Distance (Feet) 50 Feet  Assistive device Rolling walker  Ambulation/Gait Assistance Details Cueing for gait sequencing and increased weight bearing in  LLE.  Gait Pattern Step-to pattern;Decreased stride length;Decreased stance time - left  Stairs No  Wheelchair Mobility  Wheelchair Mobility No  Modified Rankin (Stroke Patients Only)  Pre-Morbid Rankin Score 1  Balance  Balance Assessed No  PT - End of Session  Equipment Utilized During Treatment Gait belt;Left knee immobilizer    Activity Tolerance Patient tolerated treatment well  Patient left in bed;in CPM;with call bell/phone within reach;with family/visitor present  Nurse Communication Mobility status  PT - Assessment/Plan  Comments on Treatment Session Pt doing much better today.  Spoke with PA, pt, and spouse about discharge plan.   Will see pt again this afternon and decide on d/c recommendations.  PT Plan Discharge plan remains appropriate;Frequency remains appropriate  PT Frequency 7X/week  Follow Up Recommendations Home health PT;Supervision - Intermittent  Equipment Recommended Rolling walker with 5" wheels  Acute Rehab PT Goals  PT Goal Formulation With patient/family  Time For Goal Achievement 01/07/12  Potential to Achieve Goals Good  Pt will go Supine/Side to Sit with modified independence;with HOB 0 degrees  PT Goal: Supine/Side to Sit - Progress Progressing toward goal  Pt will go Sit to Supine/Side with modified independence;with HOB 0 degrees  PT Goal: Sit to Supine/Side - Progress Progressing toward goal  Pt will go Sit to Stand with supervision  PT Goal: Sit to Stand - Progress Progressing toward goal  Pt will go Stand to Sit with supervision  PT Goal: Stand to Sit - Progress Progressing toward goal  Pt will Transfer Bed to Chair/Chair to Bed with supervision  PT Transfer Goal: Bed to Chair/Chair to Bed - Progress Progressing toward goal  Pt will Ambulate 51 - 150 feet;with supervision;with rolling walker  PT Goal: Ambulate - Progress Progressing toward goal  Pt will Perform Home Exercise Program Independently  Additional Goals  Additional Goal #1 Pt will report pain consistently no  greater than 4/10 and demonstrate pain relief  with min assist of spouse.   PT Goal: Additional Goal #1 - Progress Progressing toward goal  PT General Charges  $$ ACUTE PT VISIT 1 Procedure  PT Treatments  $Gait Training 8-22 mins  $Therapeutic Activity 8-22 mins   Alvena Kiernan L. Zlata Alcaide DPT [phone]

## 2011-12-25 NOTE — Progress Notes (Signed)
Referred to CSW today for ?SNF. Chart reviewed and have spoken with wife and patient at bedside. Patient and wife report plans to d/c home tomorrow with HH/DME. RNCM advised. CSW to sign off- please contact us if SW needs arise. Reece Levy, MSW, Theresia Majors [phone]

## 2012-01-08 NOTE — Discharge Summary (Signed)
  ABBREVIATED DISCHARGE SUMMARY      DATE OF HOSPITALIZATION:  24 July 13  REASON FOR HOSPITALIZATION:  66YO WM WITH HX END STAGE DJD LEFT KNEE AND PAIN.    SIGNIFICANT FINDINGS:  DJD  OPERATION:  LEFT TOTAL KNEE REPLACEMENT  FINAL DIAGNOSIS:  SAME  SECONDARY DIAGNOSIS: none  CONSULTANTS:  none  DISCHARGE CONDITION:  STABLE  DISCHARGED TO:  HOME

## 2013-06-27 ENCOUNTER — Other Ambulatory Visit: Payer: Self-pay | Admitting: Physician Assistant

## 2013-06-27 ENCOUNTER — Telehealth (HOSPITAL_COMMUNITY): Payer: Self-pay | Admitting: *Deleted
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
## 2013-06-28 ENCOUNTER — Other Ambulatory Visit (HOSPITAL_COMMUNITY): Payer: Self-pay | Admitting: Orthopedic Surgery

## 2013-06-28 DIAGNOSIS — Z01810 Encounter for preprocedural cardiovascular examination: Secondary | ICD-10-CM

## 2013-06-29 ENCOUNTER — Ambulatory Visit (HOSPITAL_COMMUNITY)
Admission: RE | Admit: 2013-06-29 | Discharge: 2013-06-29 | Disposition: A | Discharge status: Home / Self Care | Payer: Medicare Other | Source: Ambulatory Visit | Attending: Orthopedic Surgery | Admitting: Orthopedic Surgery

## 2013-06-29 DIAGNOSIS — Z01818 Encounter for other preprocedural examination: Secondary | ICD-10-CM | POA: Insufficient documentation

## 2013-06-29 DIAGNOSIS — Z01810 Encounter for preprocedural cardiovascular examination: Secondary | ICD-10-CM

## 2013-06-29 DIAGNOSIS — I82891 Chronic embolism and thrombosis of other specified veins: Secondary | ICD-10-CM | POA: Insufficient documentation

## 2013-06-29 DIAGNOSIS — M7989 Other specified soft tissue disorders: Secondary | ICD-10-CM

## 2013-06-29 NOTE — Progress Notes (Signed)
Left Lower Extremity Venous Duplex Completed. 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 Brianna L Mazza,RVT

## 2013-07-04 ENCOUNTER — Inpatient Hospital Stay (HOSPITAL_COMMUNITY): Admission: RE | Admit: 2013-07-04 | Payer: Non-veteran care | Source: Ambulatory Visit

## 2013-07-05 ENCOUNTER — Encounter (HOSPITAL_COMMUNITY)
Admission: RE | Admit: 2013-07-05 | Discharge: 2013-07-05 | Disposition: A | Discharge status: Home / Self Care | Payer: Medicare Other | Source: Ambulatory Visit | Attending: Physician Assistant | Admitting: Physician Assistant

## 2013-07-05 ENCOUNTER — Encounter (HOSPITAL_COMMUNITY)
Admission: RE | Admit: 2013-07-05 | Discharge: 2013-07-05 | Disposition: A | Discharge status: Home / Self Care | Payer: Medicare Other | Source: Ambulatory Visit | Attending: Orthopedic Surgery | Admitting: Orthopedic Surgery

## 2013-07-05 ENCOUNTER — Encounter (HOSPITAL_COMMUNITY): Payer: Self-pay

## 2013-07-05 DIAGNOSIS — Z01818 Encounter for other preprocedural examination: Secondary | ICD-10-CM | POA: Insufficient documentation

## 2013-07-05 DIAGNOSIS — Z01812 Encounter for preprocedural laboratory examination: Secondary | ICD-10-CM | POA: Insufficient documentation

## 2013-07-05 HISTORY — DX: Headache: R51

## 2013-07-05 LAB — COMPREHENSIVE METABOLIC PANEL WITH GFR
ALT: 14 U/L (ref 0–53)
AST: 23 U/L (ref 0–37)
Albumin: 4 g/dL (ref 3.5–5.2)
Alkaline Phosphatase: 82 U/L (ref 39–117)
BUN: 17 mg/dL (ref 6–23)
CO2: 26 meq/L (ref 19–32)
Calcium: 9.3 mg/dL (ref 8.4–10.5)
Chloride: 100 meq/L (ref 96–112)
Creatinine, Ser: 1.19 mg/dL (ref 0.50–1.35)
GFR calc Af Amer: 71 mL/min — ABNORMAL LOW (ref >90)
GFR calc non Af Amer: 61 mL/min — ABNORMAL LOW (ref >90)
Glucose, Bld: 90 mg/dL (ref 70–99)
Potassium: 4.2 meq/L (ref 3.7–5.3)
Sodium: 138 meq/L (ref 137–147)
Total Bilirubin: 0.5 mg/dL (ref 0.3–1.2)
Total Protein: 7.9 g/dL (ref 6.0–8.3)

## 2013-07-05 LAB — CBC WITH DIFFERENTIAL/PLATELET
BASOS ABS: 0.1 10*3/uL (ref 0.0–0.1)
Basophils Relative: 1 % (ref 0–1)
Eosinophils Absolute: 0.3 10*3/uL (ref 0.0–0.7)
Eosinophils Relative: 5 % (ref 0–5)
HCT: 42.9 % (ref 39.0–52.0)
HEMOGLOBIN: 15 g/dL (ref 13.0–17.0)
Lymphocytes Relative: 38 % (ref 12–46)
Lymphs Abs: 2.1 10*3/uL (ref 0.7–4.0)
MCH: 32.8 pg (ref 26.0–34.0)
MCHC: 35 g/dL (ref 30.0–36.0)
MCV: 93.9 fL (ref 78.0–100.0)
Monocytes Absolute: 0.5 10*3/uL (ref 0.1–1.0)
Monocytes Relative: 9 % (ref 3–12)
NEUTROS ABS: 2.7 10*3/uL (ref 1.7–7.7)
NEUTROS PCT: 48 % (ref 43–77)
PLATELETS: 169 10*3/uL (ref 150–400)
RBC: 4.57 MIL/uL (ref 4.22–5.81)
RDW: 12.5 % (ref 11.5–15.5)
WBC: 5.6 10*3/uL (ref 4.0–10.5)

## 2013-07-05 LAB — SURGICAL PCR SCREEN
MRSA, PCR: NEGATIVE
STAPHYLOCOCCUS AUREUS: NEGATIVE

## 2013-07-05 LAB — URINALYSIS, ROUTINE W REFLEX MICROSCOPIC
Bilirubin Urine: NEGATIVE
Glucose, UA: NEGATIVE mg/dL
Hgb urine dipstick: NEGATIVE
Ketones, ur: NEGATIVE mg/dL
Leukocytes, UA: NEGATIVE
Nitrite: NEGATIVE
Protein, ur: NEGATIVE mg/dL
Specific Gravity, Urine: 1.009 (ref 1.005–1.030)
Urobilinogen, UA: 0.2 mg/dL (ref 0.0–1.0)
pH: 7 (ref 5.0–8.0)

## 2013-07-05 LAB — PROTIME-INR
INR: 0.96 (ref 0.00–1.49)
Prothrombin Time: 12.6 seconds (ref 11.6–15.2)

## 2013-07-05 LAB — TYPE AND SCREEN
ABO/RH(D): O NEG
ANTIBODY SCREEN: NEGATIVE

## 2013-07-05 LAB — APTT: aPTT: 34 seconds (ref 24–37)

## 2013-07-05 NOTE — Progress Notes (Signed)
Pt denies SOB and chest pain. Pt stated that he is under the care of Dr. Marina [facility]Perry at [facility] for cardi[facility]. 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 Pt unable to state what type of heart problem he has. Pt also stated that [facility]; results requested along with cardiac study results and latest office notes. LVM with Cordelia PenSherry at Dr. Greig RightMurphy's office regarding Aspirin administration and cardiac clearance. Pt chart to be forwarded to [place], [place] ( [place]) regarding cardiac history.

## 2013-07-05 NOTE — Pre-Procedure Instructions (Addendum)
Luke Golden  07/05/2013   Your procedure is scheduled on:  Wednesday, July 12, 2013 at 8:30 AM  Report to [facility] (use Main Entrance "A'') at 6.30 AM.  Call this number if you have problems the morning of surgery: [phone]   Remember:   Do not eat food or drink liquids after midnight.   Take these medicines the morning of surgery with A SIP OF WATER: busPIRone (BUSPAR) omeprazole (PRILOSEC) 20 MG capsule Stop taking vitamins and herbal medications. Do not take any NSAIDs ie: Ibuprofen, Advil, Naproxen or any medication containing Aspirin. Stop taking Triprolidine-Pseudoephedrine (APRODINE PO) as of today. Will follow up with you regarding Aspirin administration   Do not wear jewelry, make-up or nail polish.  Do not wear lotions, powders, or perfumes. You may wear deodorant.  Do not shave 48 hours prior to surgery. Men may shave face and neck.  Do not bring valuables to the hospital.  [facility] is not responsible  for any belongings or valuables.               Contacts, dentures or bridgework may not be worn into surgery.  Leave suitcase in the car. After surgery it may be brought to your room.  For patients admitted to the hospital, discharge time is determined by your  treatment team.               Patients discharged the day of surgery will not be allowed to drive home.  Name and phone number of your driver:   Special Instructions:Special Instructions: [facility] - Preparing for Surgery  Before surgery, you can play an important role.  Because skin is not sterile, your skin needs to be as free of germs as possible.  You can reduce the number of germs on you skin by washing with CHG (chlorahexidine gluconate) soap before surgery.  CHG is an antiseptic cleaner which kills germs and bonds with the skin to continue killing germs even after washing.  Please DO NOT use if you have an allergy to CHG or antibacterial soaps.  If your skin becomes reddened/irritated  stop using the CHG and inform your nurse when you arrive at Short Stay.  Do not shave (including legs and underarms) for at least 48 hours prior to the first CHG shower.  You may shave your face.  Please follow these instructions carefully:   1.  Shower with CHG Soap the night before surgery and the morning of Surgery.  2.  If you choose to wash your hair, wash your hair first as usual with your normal shampoo.  3.  After you shampoo, rinse your hair and body thoroughly to remove the Shampoo.  4.  Use CHG as you would any other liquid soap.  You can apply chg directly  to the skin and wash gently with scrungie or a clean washcloth.  5.  Apply the CHG Soap to your body ONLY FROM THE NECK DOWN.  Do not use on open wounds or open sores.  Avoid contact with your eyes, ears, mouth and genitals (private parts).  Wash genitals (private parts) with your normal soap.  6.  Wash thoroughly, paying special attention to the area where your surgery will be performed.  7.  Thoroughly rinse your body with warm water from the neck down.  8.  DO NOT shower/wash with your normal soap after using and rinsing off the CHG Soap.  9.  Pat yourself dry with a clean towel.  10.  Wear clean pajamas.            11.  Place clean sheets on your bed the night of your first shower and do not sleep with pets.  Day of Surgery  Do not apply any lotions/deoderants the morning of surgery.  Please wear clean clothes to the hospital/surgery center.      Please read over the following fact sheets that you were given: Pain Booklet, Coughing and Deep Breathing, Blood Transfusion Information, Total Joint Packet, MRSA Information and Surgical Site Infection Prevention

## 2013-07-06 LAB — URINE CULTURE
Colony Count: NO GROWTH
Culture: NO GROWTH

## 2013-07-06 NOTE — Progress Notes (Signed)
Anesthesia Chart Review:  Patient is a 68 year old male scheduled for right TKA on 2/11/5 by Dr. Mckinley Jewelaniel Murphy.   History includes former smoker, HTN, LLE DVT following MCA, anxiety, PTSD, arthritis, left TKA '13.  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  He was seen by cardiologist Dr. Payton SparkLarry Steven Perry [facility] on 06/26/13 and felt acceptable risk for this procedure.   [facility]  Nuclear stress test on 09/16/11 [facility] showed some apical thinning no abnormal wall motion, LVEF post stress 68%, normal examination.   Cardiac cath on 12/31/10 showed:  1. Evidence for coronary calcification involving the left main, left anterior descending coronary artery, circumflex, and right coronary artery with 20% ostial left main narrowing, luminal irregularities without significant obstruction in the left anterior descending coronary artery; 20% ostial circumflex narrowing; and 40-50% ostial proximal right coronary artery stenosis with 20% posterior descending artery stenosis.  2. 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  Medical therapy was recommended.  Dr. 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 No acute cardiopulmonary disease. Stable examination.   Preoperative labs noted.  If no acute changes then I anticipate that he can proceed as planned.  Velna Ochsllison Jullie Arps, PA-C [facility]/Anesthesiology Phone [phone] 07/06/2013 6:21 PM

## 2013-07-06 NOTE — Progress Notes (Signed)
Pt stated that Dr. Greig RightMurphy's nurse called and instructed him to "stop taking aspirin 3 days before surgery."

## 2013-07-10 NOTE — Progress Notes (Signed)
Medical records dept called with request for EKG.  States they will check and call us back if unable to find.

## 2013-07-11 MED ORDER — CLINDAMYCIN PHOSPHATE 900 MG/50ML IV SOLN
900.0000 mg | INTRAVENOUS | Status: DC
  Filled 2013-07-11: qty 50

## 2013-07-12 ENCOUNTER — Inpatient Hospital Stay (HOSPITAL_COMMUNITY): Payer: Non-veteran care | Admitting: Anesthesiology

## 2013-07-12 ENCOUNTER — Encounter (HOSPITAL_COMMUNITY): Payer: Non-veteran care | Admitting: Vascular Surgery

## 2013-07-12 ENCOUNTER — Encounter (HOSPITAL_COMMUNITY): Payer: Self-pay | Admitting: *Deleted

## 2013-07-12 ENCOUNTER — Inpatient Hospital Stay (HOSPITAL_COMMUNITY): Payer: Non-veteran care

## 2013-07-12 ENCOUNTER — Inpatient Hospital Stay (HOSPITAL_COMMUNITY)
Admission: RE | Admit: 2013-07-12 | Discharge: 2013-07-14 | DRG: 470 | Disposition: A | Discharge status: Home / Self Care | Payer: Non-veteran care | Source: Ambulatory Visit | Attending: Orthopedic Surgery | Admitting: Orthopedic Surgery

## 2013-07-12 ENCOUNTER — Encounter (HOSPITAL_COMMUNITY)
Admission: RE | Disposition: A | Discharge status: Home / Self Care | Payer: Self-pay | Source: Ambulatory Visit | Attending: Orthopedic Surgery

## 2013-07-12 DIAGNOSIS — F411 Generalized anxiety disorder: Secondary | ICD-10-CM | POA: Diagnosis present

## 2013-07-12 DIAGNOSIS — M1710 Unilateral primary osteoarthritis, unspecified knee: Secondary | ICD-10-CM | POA: Diagnosis present

## 2013-07-12 DIAGNOSIS — M179 Osteoarthritis of knee, unspecified: Secondary | ICD-10-CM | POA: Diagnosis present

## 2013-07-12 DIAGNOSIS — I10 Essential (primary) hypertension: Secondary | ICD-10-CM | POA: Diagnosis present

## 2013-07-12 DIAGNOSIS — Z87891 Personal history of nicotine dependence: Secondary | ICD-10-CM

## 2013-07-12 DIAGNOSIS — F4310 Post-traumatic stress disorder, unspecified: Secondary | ICD-10-CM | POA: Diagnosis present

## 2013-07-12 HISTORY — PX: TOTAL KNEE ARTHROPLASTY: SHX125

## 2013-07-12 LAB — CBC
HEMATOCRIT: 39.9 % (ref 39.0–52.0)
Hemoglobin: 13.6 g/dL (ref 13.0–17.0)
MCH: 32.1 pg (ref 26.0–34.0)
MCHC: 34.1 g/dL (ref 30.0–36.0)
MCV: 94.1 fL (ref 78.0–100.0)
PLATELETS: 177 10*3/uL (ref 150–400)
RBC: 4.24 MIL/uL (ref 4.22–5.81)
RDW: 12.6 % (ref 11.5–15.5)
WBC: 12.3 10*3/uL — AB (ref 4.0–10.5)

## 2013-07-12 LAB — CREATININE, SERUM
Creatinine, Ser: 1.09 mg/dL (ref 0.50–1.35)
GFR calc non Af Amer: 68 mL/min — ABNORMAL LOW (ref >90)
GFR, EST AFRICAN AMERICAN: 79 mL/min — AB (ref >90)

## 2013-07-12 SURGERY — ARTHROPLASTY, KNEE, TOTAL
Anesthesia: General | Site: Knee | Laterality: Right

## 2013-07-12 MED ORDER — CHLORHEXIDINE GLUCONATE 4 % EX LIQD
60.0000 mL | Freq: Once | CUTANEOUS | Status: DC
  Filled 2013-07-12: qty 60

## 2013-07-12 MED ORDER — MIDAZOLAM HCL 2 MG/2ML IJ SOLN: 1.0000 mg | INTRAMUSCULAR | Status: DC | PRN

## 2013-07-12 MED ORDER — BISACODYL 5 MG PO TBEC: 5.0000 mg | DELAYED_RELEASE_TABLET | Freq: Every day | ORAL | Status: DC | PRN

## 2013-07-12 MED ORDER — CHLORHEXIDINE GLUCONATE 4 % EX LIQD: 60.0000 mL | Freq: Once | CUTANEOUS | Status: DC

## 2013-07-12 MED ORDER — METOCLOPRAMIDE HCL 10 MG PO TABS: 5.0000 mg | ORAL_TABLET | Freq: Three times a day (TID) | ORAL | Status: DC | PRN

## 2013-07-12 MED ORDER — POTASSIUM CHLORIDE IN NACL 20-0.9 MEQ/L-% IV SOLN
INTRAVENOUS | Status: DC
  Administered 2013-07-12 – 2013-07-13 (×2): via INTRAVENOUS
  Filled 2013-07-12 (×3): qty 1000

## 2013-07-12 MED ORDER — PROMETHAZINE HCL 25 MG/ML IJ SOLN: 6.2500 mg | INTRAMUSCULAR | Status: DC | PRN

## 2013-07-12 MED ORDER — OXYCODONE HCL 5 MG PO TABS
5.0000 mg | ORAL_TABLET | Freq: Once | ORAL | Status: AC | PRN
  Administered 2013-07-12: 5 mg via ORAL

## 2013-07-12 MED ORDER — HYDROMORPHONE HCL PF 1 MG/ML IJ SOLN
0.5000 mg | INTRAMUSCULAR | Status: DC | PRN
  Administered 2013-07-12 (×4): 0.5 mg via INTRAVENOUS
  Filled 2013-07-12 (×4): qty 1

## 2013-07-12 MED ORDER — ROCURONIUM BROMIDE 50 MG/5ML IV SOLN
INTRAVENOUS | Status: AC
  Filled 2013-07-12: qty 1

## 2013-07-12 MED ORDER — OXYCODONE HCL 5 MG PO TABS
ORAL_TABLET | ORAL | Status: AC
  Filled 2013-07-12: qty 1

## 2013-07-12 MED ORDER — SIMVASTATIN 10 MG PO TABS
10.0000 mg | ORAL_TABLET | Freq: Every day | ORAL | Status: DC
  Administered 2013-07-12 – 2013-07-13 (×2): 10 mg via ORAL
  Filled 2013-07-12 (×3): qty 1

## 2013-07-12 MED ORDER — BUSPIRONE HCL 10 MG PO TABS
10.0000 mg | ORAL_TABLET | Freq: Two times a day (BID) | ORAL | Status: DC
  Administered 2013-07-12 – 2013-07-14 (×4): 10 mg via ORAL
  Filled 2013-07-12 (×5): qty 1

## 2013-07-12 MED ORDER — DIPHENHYDRAMINE HCL 12.5 MG/5ML PO ELIX: 12.5000 mg | ORAL_SOLUTION | ORAL | Status: DC | PRN

## 2013-07-12 MED ORDER — CELECOXIB 200 MG PO CAPS
200.0000 mg | ORAL_CAPSULE | Freq: Two times a day (BID) | ORAL | Status: DC
  Administered 2013-07-12 – 2013-07-14 (×5): 200 mg via ORAL
  Filled 2013-07-12 (×6): qty 1

## 2013-07-12 MED ORDER — METOPROLOL TARTRATE 25 MG PO TABS
25.0000 mg | ORAL_TABLET | Freq: Two times a day (BID) | ORAL | Status: DC
  Administered 2013-07-12 – 2013-07-14 (×4): 25 mg via ORAL
  Filled 2013-07-12 (×5): qty 1

## 2013-07-12 MED ORDER — PHENOL 1.4 % MT LIQD
1.0000 | OROMUCOSAL | Status: DC | PRN
  Administered 2013-07-12: 1 via OROMUCOSAL
  Filled 2013-07-12: qty 177

## 2013-07-12 MED ORDER — ASPIRIN EC 325 MG PO TBEC: 325.0000 mg | DELAYED_RELEASE_TABLET | Freq: Every day | ORAL | Status: DC

## 2013-07-12 MED ORDER — PANTOPRAZOLE SODIUM 40 MG PO TBEC
40.0000 mg | DELAYED_RELEASE_TABLET | Freq: Two times a day (BID) | ORAL | Status: DC
  Administered 2013-07-12 – 2013-07-14 (×5): 40 mg via ORAL
  Filled 2013-07-12 (×3): qty 1

## 2013-07-12 MED ORDER — SODIUM CHLORIDE 0.9 % IJ SOLN
INTRAMUSCULAR | Status: DC | PRN
  Administered 2013-07-12: 10:00:00

## 2013-07-12 MED ORDER — BUPIVACAINE LIPOSOME 1.3 % IJ SUSP
20.0000 mL | Freq: Once | INTRAMUSCULAR | Status: DC
  Filled 2013-07-12: qty 20

## 2013-07-12 MED ORDER — FENTANYL CITRATE 0.05 MG/ML IJ SOLN
INTRAMUSCULAR | Status: AC
  Filled 2013-07-12: qty 5

## 2013-07-12 MED ORDER — LACTATED RINGERS IV SOLN: INTRAVENOUS | Status: DC

## 2013-07-12 MED ORDER — PROPOFOL 10 MG/ML IV BOLUS
INTRAVENOUS | Status: DC | PRN
  Administered 2013-07-12: 200 mg via INTRAVENOUS

## 2013-07-12 MED ORDER — ONDANSETRON HCL 4 MG/2ML IJ SOLN
4.0000 mg | Freq: Four times a day (QID) | INTRAMUSCULAR | Status: DC | PRN
Start: 2013-07-12 — End: 2013-07-14

## 2013-07-12 MED ORDER — BUPIVACAINE HCL (PF) 0.25 % IJ SOLN
INTRAMUSCULAR | Status: AC
  Filled 2013-07-12: qty 30

## 2013-07-12 MED ORDER — ENOXAPARIN SODIUM 30 MG/0.3ML ~~LOC~~ SOLN: 30.0000 mg | Freq: Two times a day (BID) | SUBCUTANEOUS | Status: DC

## 2013-07-12 MED ORDER — METHOCARBAMOL 500 MG PO TABS
500.0000 mg | ORAL_TABLET | Freq: Four times a day (QID) | ORAL | Status: DC | PRN
  Administered 2013-07-12 (×3): 500 mg via ORAL
  Filled 2013-07-12 (×2): qty 1

## 2013-07-12 MED ORDER — SUCCINYLCHOLINE CHLORIDE 20 MG/ML IJ SOLN
INTRAMUSCULAR | Status: AC
  Filled 2013-07-12: qty 1

## 2013-07-12 MED ORDER — MIDAZOLAM HCL 2 MG/2ML IJ SOLN
INTRAMUSCULAR | Status: AC
  Filled 2013-07-12: qty 2

## 2013-07-12 MED ORDER — FENTANYL CITRATE 0.05 MG/ML IJ SOLN
INTRAMUSCULAR | Status: DC | PRN
  Administered 2013-07-12 (×6): 50 ug via INTRAVENOUS
  Administered 2013-07-12: 100 ug via INTRAVENOUS
  Administered 2013-07-12 (×2): 50 ug via INTRAVENOUS

## 2013-07-12 MED ORDER — ENOXAPARIN SODIUM 30 MG/0.3ML ~~LOC~~ SOLN
30.0000 mg | Freq: Two times a day (BID) | SUBCUTANEOUS | Status: DC
  Administered 2013-07-13 – 2013-07-14 (×3): 30 mg via SUBCUTANEOUS
  Filled 2013-07-12 (×5): qty 0.3

## 2013-07-12 MED ORDER — HYDROMORPHONE HCL PF 1 MG/ML IJ SOLN
0.2500 mg | INTRAMUSCULAR | Status: DC | PRN
  Administered 2013-07-12 (×4): 0.5 mg via INTRAVENOUS

## 2013-07-12 MED ORDER — PROPOFOL 10 MG/ML IV BOLUS
INTRAVENOUS | Status: AC
  Filled 2013-07-12: qty 20

## 2013-07-12 MED ORDER — METHOCARBAMOL 100 MG/ML IJ SOLN
500.0000 mg | Freq: Four times a day (QID) | INTRAVENOUS | Status: DC | PRN
  Filled 2013-07-12: qty 5

## 2013-07-12 MED ORDER — OXYCODONE HCL 5 MG PO TABS: 5.0000 mg | ORAL_TABLET | ORAL | Status: DC | PRN

## 2013-07-12 MED ORDER — DOCUSATE SODIUM 100 MG PO CAPS
100.0000 mg | ORAL_CAPSULE | Freq: Two times a day (BID) | ORAL | Status: DC
  Administered 2013-07-12 – 2013-07-13 (×4): 100 mg via ORAL
  Filled 2013-07-12 (×6): qty 1

## 2013-07-12 MED ORDER — EPHEDRINE SULFATE 50 MG/ML IJ SOLN
INTRAMUSCULAR | Status: AC
  Filled 2013-07-12: qty 1

## 2013-07-12 MED ORDER — LIDOCAINE HCL (CARDIAC) 20 MG/ML IV SOLN
INTRAVENOUS | Status: DC | PRN
  Administered 2013-07-12 (×2): 50 mg via INTRAVENOUS

## 2013-07-12 MED ORDER — MIDAZOLAM HCL 5 MG/5ML IJ SOLN
INTRAMUSCULAR | Status: DC | PRN
  Administered 2013-07-12: 2 mg via INTRAVENOUS

## 2013-07-12 MED ORDER — SODIUM CHLORIDE 0.9 % IR SOLN
Status: DC | PRN
  Administered 2013-07-12: 3000 mL

## 2013-07-12 MED ORDER — LISINOPRIL 5 MG PO TABS
5.0000 mg | ORAL_TABLET | Freq: Every day | ORAL | Status: DC
  Administered 2013-07-12 – 2013-07-13 (×2): 5 mg via ORAL
  Filled 2013-07-12 (×3): qty 1

## 2013-07-12 MED ORDER — METHOCARBAMOL 500 MG PO TABS: 500.0000 mg | ORAL_TABLET | Freq: Four times a day (QID) | ORAL | Status: DC | PRN

## 2013-07-12 MED ORDER — CLINDAMYCIN PHOSPHATE 600 MG/50ML IV SOLN
600.0000 mg | Freq: Four times a day (QID) | INTRAVENOUS | Status: AC
  Administered 2013-07-12 (×2): 600 mg via INTRAVENOUS
  Filled 2013-07-12 (×3): qty 50

## 2013-07-12 MED ORDER — HYDROMORPHONE HCL PF 1 MG/ML IJ SOLN
INTRAMUSCULAR | Status: AC
  Filled 2013-07-12: qty 2

## 2013-07-12 MED ORDER — DEXAMETHASONE SODIUM PHOSPHATE 10 MG/ML IJ SOLN
10.0000 mg | Freq: Three times a day (TID) | INTRAMUSCULAR | Status: AC
  Filled 2013-07-12 (×3): qty 1

## 2013-07-12 MED ORDER — LIDOCAINE HCL (CARDIAC) 20 MG/ML IV SOLN
INTRAVENOUS | Status: AC
  Filled 2013-07-12: qty 5

## 2013-07-12 MED ORDER — OXYCODONE HCL 5 MG PO TABS
5.0000 mg | ORAL_TABLET | ORAL | Status: DC | PRN
  Administered 2013-07-12 – 2013-07-14 (×9): 10 mg via ORAL
  Filled 2013-07-12 (×9): qty 2

## 2013-07-12 MED ORDER — METOCLOPRAMIDE HCL 5 MG/ML IJ SOLN: 5.0000 mg | Freq: Three times a day (TID) | INTRAMUSCULAR | Status: DC | PRN

## 2013-07-12 MED ORDER — METOPROLOL TARTRATE 25 MG PO TABS: 25.0000 mg | ORAL_TABLET | Freq: Every day | ORAL | Status: DC

## 2013-07-12 MED ORDER — PHENYLEPHRINE 40 MCG/ML (10ML) SYRINGE FOR IV PUSH (FOR BLOOD PRESSURE SUPPORT)
PREFILLED_SYRINGE | INTRAVENOUS | Status: AC
  Filled 2013-07-12: qty 10

## 2013-07-12 MED ORDER — CLINDAMYCIN PHOSPHATE 900 MG/50ML IV SOLN
INTRAVENOUS | Status: DC | PRN
  Administered 2013-07-12: 900 mg via INTRAVENOUS

## 2013-07-12 MED ORDER — BUPIVACAINE HCL (PF) 0.25 % IJ SOLN
INTRAMUSCULAR | Status: DC | PRN
  Administered 2013-07-12: 10 mL

## 2013-07-12 MED ORDER — FENTANYL CITRATE 0.05 MG/ML IJ SOLN: 50.0000 ug | Freq: Once | INTRAMUSCULAR | Status: DC

## 2013-07-12 MED ORDER — ONDANSETRON HCL 4 MG PO TABS: 4.0000 mg | ORAL_TABLET | Freq: Four times a day (QID) | ORAL | Status: DC | PRN

## 2013-07-12 MED ORDER — MENTHOL 3 MG MT LOZG: 1.0000 | LOZENGE | OROMUCOSAL | Status: DC | PRN

## 2013-07-12 MED ORDER — METHOCARBAMOL 500 MG PO TABS
ORAL_TABLET | ORAL | Status: AC
  Filled 2013-07-12: qty 1

## 2013-07-12 MED ORDER — DEXAMETHASONE 6 MG PO TABS
10.0000 mg | ORAL_TABLET | Freq: Three times a day (TID) | ORAL | Status: AC
  Administered 2013-07-12 – 2013-07-13 (×3): 10 mg via ORAL
  Filled 2013-07-12 (×3): qty 1

## 2013-07-12 MED ORDER — OXYCODONE HCL 5 MG/5ML PO SOLN: 5.0000 mg | Freq: Once | ORAL | Status: AC | PRN

## 2013-07-12 MED ORDER — LACTATED RINGERS IV SOLN
INTRAVENOUS | Status: DC | PRN
  Administered 2013-07-12 (×2): via INTRAVENOUS

## 2013-07-12 SURGICAL SUPPLY — 56 items
APL SKNCLS STERI-STRIP NONHPOA (GAUZE/BANDAGES/DRESSINGS) ×1
BANDAGE ESMARK 6X9 LF (GAUZE/BANDAGES/DRESSINGS) ×1 IMPLANT
BENZOIN TINCTURE PRP APPL 2/3 (GAUZE/BANDAGES/DRESSINGS) ×2 IMPLANT
BLADE SAG 18X100X1.27 (BLADE) ×4 IMPLANT
BNDG CMPR 9X6 STRL LF SNTH (GAUZE/BANDAGES/DRESSINGS) ×1
BNDG ESMARK 6X9 LF (GAUZE/BANDAGES/DRESSINGS) ×2
BOWL SMART MIX CTS (DISPOSABLE) ×2 IMPLANT
CEMENT BONE SIMPLEX SPEEDSET (Cement) ×4 IMPLANT
COVER SURGICAL LIGHT HANDLE (MISCELLANEOUS) ×2 IMPLANT
CUFF TOURNIQUET SINGLE 34IN LL (TOURNIQUET CUFF) ×2 IMPLANT
DRAPE EXTREMITY T 121X128X90 (DRAPE) ×2 IMPLANT
DRAPE PROXIMA HALF (DRAPES) ×2 IMPLANT
DRAPE U-SHAPE 47X51 STRL (DRAPES) ×2 IMPLANT
DRSG PAD ABDOMINAL 8X10 ST (GAUZE/BANDAGES/DRESSINGS) ×2 IMPLANT
DURAPREP 26ML APPLICATOR (WOUND CARE) ×2 IMPLANT
ELECT CAUTERY BLADE 6.4 (BLADE) ×2 IMPLANT
ELECT REM PT RETURN 9FT ADLT (ELECTROSURGICAL) ×2
ELECTRODE REM PT RTRN 9FT ADLT (ELECTROSURGICAL) ×1 IMPLANT
EVACUATOR 1/8 PVC DRAIN (DRAIN) ×2 IMPLANT
FACESHIELD LNG OPTICON STERILE (SAFETY) ×4 IMPLANT
GLOVE BIO SURGEON STRL SZ 6.5 (GLOVE) ×4 IMPLANT
GLOVE BIOGEL PI IND STRL 7.0 (GLOVE) ×2 IMPLANT
GLOVE BIOGEL PI INDICATOR 7.0 (GLOVE) ×2
GLOVE ORTHO TXT STRL SZ7.5 (GLOVE) ×2 IMPLANT
GOWN STRL REUS W/TWL 2XL LVL3 (GOWN DISPOSABLE) ×2 IMPLANT
HANDPIECE INTERPULSE COAX TIP (DISPOSABLE) ×2
IMMOBILIZER KNEE 22 UNIV (SOFTGOODS) ×2 IMPLANT
IMMOBILIZER KNEE 24 THIGH 36 (MISCELLANEOUS) IMPLANT
IMMOBILIZER KNEE 24 UNIV (MISCELLANEOUS)
KIT BASIN OR (CUSTOM PROCEDURE TRAY) ×2 IMPLANT
KIT ROOM TURNOVER OR (KITS) ×2 IMPLANT
KNEE/VIT E POLY LINER LEVEL 1B ×1 IMPLANT
MANIFOLD NEPTUNE II (INSTRUMENTS) ×2 IMPLANT
NDL HYPO 25GX1X1/2 BEV (NEEDLE) ×1 IMPLANT
NEEDLE HYPO 25GX1X1/2 BEV (NEEDLE) ×2 IMPLANT
NS IRRIG 1000ML POUR BTL (IV SOLUTION) ×2 IMPLANT
PACK TOTAL JOINT (CUSTOM PROCEDURE TRAY) ×2 IMPLANT
PAD ARMBOARD 7.5X6 YLW CONV (MISCELLANEOUS) ×4 IMPLANT
PAD CAST 4YDX4 CTTN HI CHSV (CAST SUPPLIES) ×1 IMPLANT
PADDING CAST COTTON 4X4 STRL (CAST SUPPLIES) ×2
PADDING CAST COTTON 6X4 STRL (CAST SUPPLIES) ×2 IMPLANT
SET HNDPC FAN SPRY TIP SCT (DISPOSABLE) ×1 IMPLANT
SPONGE GAUZE 4X4 12PLY (GAUZE/BANDAGES/DRESSINGS) ×2 IMPLANT
STRIP CLOSURE SKIN 1/2X4 (GAUZE/BANDAGES/DRESSINGS) ×4 IMPLANT
SUCTION FRAZIER TIP 10 FR DISP (SUCTIONS) ×2 IMPLANT
SUT MNCRL AB 4-0 PS2 18 (SUTURE) ×2 IMPLANT
SUT MON AB 2-0 CT1 36 (SUTURE) ×2 IMPLANT
SUT VIC AB 0 CT1 27 (SUTURE) ×2
SUT VIC AB 0 CT1 27XBRD ANBCTR (SUTURE) ×1 IMPLANT
SUT VIC AB 2-0 CT1 27 (SUTURE) ×2
SUT VIC AB 2-0 CT1 TAPERPNT 27 (SUTURE) ×1 IMPLANT
SYR 50ML LL SCALE MARK (SYRINGE) ×2 IMPLANT
SYR CONTROL 10ML LL (SYRINGE) ×2 IMPLANT
TOWEL OR 17X24 6PK STRL BLUE (TOWEL DISPOSABLE) ×2 IMPLANT
TOWEL OR 17X26 10 PK STRL BLUE (TOWEL DISPOSABLE) ×2 IMPLANT
WATER STERILE IRR 1000ML POUR (IV SOLUTION) ×4 IMPLANT

## 2013-07-12 NOTE — Anesthesia Procedure Notes (Signed)
Procedure Name: LMA Insertion Date/Time: 07/12/2013 8:37 AM Performed by: Whitman HeroVITSHTEYN, Brookley Spitler Pre-anesthesia Checklist: Patient identified, Emergency Drugs available, Patient being monitored and Suction available Patient Re-evaluated:Patient Re-evaluated prior to inductionOxygen Delivery Method: Circle system utilized Preoxygenation: Pre-oxygenation with 100% oxygen Intubation Type: IV induction Ventilation: Mask ventilation without difficulty LMA: LMA inserted LMA Size: 4.0 Number of attempts: 1 Placement Confirmation: ETT inserted through vocal cords under direct vision,  breath sounds checked- equal and bilateral and positive ETCO2 Tube secured with: Tape Dental Injury: Teeth and Oropharynx as per pre-operative assessment

## 2013-07-12 NOTE — Care Management Note (Signed)
CARE MANAGEMENT NOTE 07/12/2013  Patient:  Blima RichDUDLEY,Luke A   Account Number:  [id number]  Date Initiated:  07/12/2013  Documentation initiated by:  Luke PeperBRADY,Makai Dumond  Subjective/Objective Assessment:   68 yr old male s/p right total knee arthroplasty.     Action/Plan:   PT/OT eval.  [facility] Patient is covered by [employer].   Anticipated DC Date:     Anticipated DC Plan:           Choice offered to / List presented to:             Status of service:  In process, will continue to follow     07/12/13 12:56pm Luke PeperSusan  Delila Kuklinski, RN BSN Case Manager [phone] Case Manager spoke with patient and his wife. 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 Patient and wife do not want him to go to SNF in [place], they want him to go to SNF in [place]. Veterans contact person is Donia PoundsSusan Carlton [phone]. Case manager will notify our Child psychotherapistsocial worker.

## 2013-07-12 NOTE — Preoperative (Addendum)
Beta Blockers   Reason not to administer Beta Blockers:Not Applicable, taken po at home

## 2013-07-12 NOTE — Transfer of Care (Signed)
Immediate Anesthesia Transfer of Care Note  Patient: Luke MaladyEdwin A Bibby  Procedure(s) Performed: Procedure(s): TOTAL KNEE ARTHROPLASTY (Right)  Patient Location: PACU  Anesthesia Type:General  Level of Consciousness: awake, alert  and oriented  Airway & Oxygen Therapy: Patient Spontanous Breathing and Patient connected to nasal cannula oxygen  Post-op Assessment: Report given to PACU RN and Post -op Vital signs reviewed and stable  Post vital signs: Reviewed and stable  Complications: No apparent anesthesia complications

## 2013-07-12 NOTE — Addendum Note (Signed)
Addendum created 07/12/13 1303 by Whitman HeroAlex Rokia Bosket, CRNA   Modules edited: Anesthesia Medication Administration

## 2013-07-12 NOTE — Progress Notes (Signed)
Utilization review completed.  

## 2013-07-12 NOTE — Progress Notes (Signed)
Clinical Social Work Department BRIEF PSYCHOSOCIAL ASSESSMENT 07/12/2013  Patient:  Liz MaladyDUDLEY,Lochlann A     Account Number:  [id number]     Admit date:  07/12/2013  Clinical Social Worker:  Harless NakayamaAMBELAL,Tyrena Gohr, LCSWA  Date/Time:  07/12/2013 02:35 PM  Referred by:  Physician  Date Referred:  07/12/2013 Referred for  SNF Placement   Other Referral:   Interview type:  Patient Other interview type:    PSYCHOSOCIAL DATA Living Status:  WIFE Admitted from facility:   Level of care:   Primary support name:  Rayburn FeltCindy Klose [phone] Primary support relationship to patient:  SPOUSE Degree of support available:   Pt has supportive family    CURRENT CONCERNS Current Concerns  Post-Acute Placement   Other Concerns:    SOCIAL WORK ASSESSMENT / PLAN CSW made aware that pt and pt family requesting to speak with CSW about SNF placement. CSW visited room and spoke with pt and pt wife. Pt was still on medication from surgery however so bulk of conversation was between CSW and pt wife. CSW asked if pt had already worked with PT and explained SNF referral process. Pt wife informed CSW that home is not an option because of pt PTSD and he cannot be alone. Pt wife [place]. CSW did explain that at this time there is not a facilit in [place] that is admitting [facility] p[facility] (this was confirmed with [facility]--the only facility in [place] that is contracted with [facility]). Pt wife was very disappointed. She informed CSW that she has high anxiety about driving on the highway and pt will likely not have many visitors during stay in SNF if it is further away. CSW offered support. CSW informed pt family that would wait for PT official recommendation and proceed with SNF placement if necessary.    CSW spoke with PT and was informed that pt could be appropriate for home if he had assistance. At this time, family is reporting no assistance available at home while pt wife is at work. CSW called pt [facility] Social  Worker Donia PoundsSusan Carlton [phone] and left voicemail to see if Ms. Les PouCarlton could be of assitance with find pt a SNF in a timely manner. CSW also reviewed case with RN CM.   Assessment/plan status:  Psychosocial Support/Ongoing Assessment of Needs Other assessment/ plan:   Information/referral to community resources:   None needed at this time    PATIENT'S/FAMILY'S RESPONSE TO PLAN OF CARE: Pt and pt family wanting SNF for ST rehab.       Loanne Emery, LCSWA [phone]
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
## 2013-07-12 NOTE — Anesthesia Preprocedure Evaluation (Signed)
Anesthesia Evaluation  Patient identified by MRN, date of birth, ID band Patient awake    Reviewed: Allergy & Precautions, H&P , NPO status , Patient's Chart, lab work & pertinent test results  Airway Mallampati: II TM Distance: >3 FB Neck ROM: Full    Dental   Pulmonary shortness of breath, former smoker,  breath sounds clear to auscultation        Cardiovascular hypertension, Rhythm:Regular Rate:Normal     Neuro/Psych  Headaches, Anxiety    GI/Hepatic (+)     substance abuse   ,   Endo/Other    Renal/GU      Musculoskeletal   Abdominal (+) + obese,   Peds  Hematology   Anesthesia Other Findings   Reproductive/Obstetrics                           Anesthesia Physical Anesthesia Plan  ASA: III  Anesthesia Plan: General   Post-op Pain Management:    Induction: Intravenous  Airway Management Planned: LMA  Additional Equipment:   Intra-op Plan:   Post-operative Plan: Extubation in OR  Informed Consent: I have reviewed the patients History and Physical, chart, labs and discussed the procedure including the risks, benefits and alternatives for the proposed anesthesia with the patient or authorized representative who has indicated his/her understanding and acceptance.     Plan Discussed with: CRNA and Surgeon  Anesthesia Plan Comments:         Anesthesia Quick Evaluation
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
## 2013-07-12 NOTE — Progress Notes (Signed)
CSW (Clinical Child psychotherapistocial Worker) received call back from [employer]an Carlton. She is going to fax over packet for rehab request. This will be filled out by CSW and sent back for review as soon as possible.   Myquan Schaumburg, LCSWA [phone]

## 2013-07-12 NOTE — Progress Notes (Signed)
Orthopedic Tech Progress Note Patient Details:  Luke Golden A Skeet Jul 26, 1945 [id number]  CPM Right Knee CPM Right Knee: On Right Knee Flexion (Degrees): 60 Right Knee Extension (Degrees): 0 Additional Comments: Trapeze bar foot roll   Cammer, Mickie BailJennifer Carol 07/12/2013, 11:20 AM

## 2013-07-12 NOTE — Anesthesia Postprocedure Evaluation (Signed)
  Anesthesia Post-op Note  Patient: Luke Golden  Procedure(s) Performed: Procedure(s): TOTAL KNEE ARTHROPLASTY (Right)  Patient Location: PACU  Anesthesia Type:General  Level of Consciousness: awake and alert   Airway and Oxygen Therapy: Patient Spontanous Breathing  Post-op Pain: mild  Post-op Assessment: Post-op Vital signs reviewed, Patient's Cardiovascular Status Stable, Respiratory Function Stable, Patent Airway, No signs of Nausea or vomiting and Pain level controlled  Post-op Vital Signs: Reviewed and stable  Complications: No apparent anesthesia complications

## 2013-07-12 NOTE — Interval H&P Note (Signed)
History and Physical Interval Note:  07/12/2013 8:13 AM  Luke Golden  has presented today for surgery, with the diagnosis of DJD RIGHT KNEE  The various methods of treatment have been discussed with the patient and family. After consideration of risks, benefits and other options for treatment, the patient has consented to  Procedure(s): TOTAL KNEE ARTHROPLASTY (Right) as a surgical intervention .  The patient's history has been reviewed, patient examined, no change in status, stable for surgery.  I have reviewed the patient's chart and labs.  Questions were answered to the patient's satisfaction.     Montrail Mehrer F

## 2013-07-12 NOTE — Progress Notes (Signed)
Orthopedic Tech Progress Note Patient Details:  Luke Golden 11/28/1945 [id number]  Ortho Devices Type of Ortho Device: Knee Immobilizer Ortho Device/Splint Interventions: Application   Cammer, Mickie BailJennifer Carol 07/12/2013, 11:56 AM

## 2013-07-12 NOTE — Discharge Instructions (Signed)
Total Knee Replacement °Care After °Refer to this sheet in the next few weeks. These discharge instructions provide you with general information on caring for yourself after you leave the hospital. Your caregiver may also give you specific instructions. Your treatment has been planned according to the most current medical practices available, but unavoidable complications sometimes occur. If you have any problems or questions after discharge, please call your caregiver. Regaining a near full range of motion of your knee within the first 3 to 6 weeks after surgery is critical. °HOME CARE INSTRUCTIONS  °You may resume a normal diet and activities as directed.  °Perform exercises as directed.  °Place gray foam block, curve side up under heel at all times except when in CPM or when walking.  DO NOT modify, tear, cut, or change in any way the gray foam block. °You will receive physical therapy daily  °Take showers instead of baths until informed otherwise.  You may shower on Sunday.  Please wash whole leg including wound with soap and water  °Change bandages (dressings)daily °It is OK to take over-the-counter tylenol in addition to the oxycodone for pain, discomfort, or fever. °Oxycodone is VERY constipating.  Please take stool softener twice a day and laxatives daily until bowels are regular °Eat a well-balanced diet.  °Avoid lifting or driving until you are instructed otherwise.  °Make an appointment to see your caregiver for stitches (suture) or staple removal as directed.  °If you have been sent home with a continuous passive motion machine (CPM machine), 0-90 degrees 6 hrs a day   2 hrs a shift °SEEK MEDICAL CARE IF: °You have swelling of your calf or leg.  °You develop shortness of breath or chest pain.  °You have redness, swelling, or increasing pain in the wound.  °There is pus or any unusual drainage coming from the surgical site.  °You notice a bad smell coming from the surgical site or dressing.  °The surgical  site breaks open after sutures or staples have been removed.  °There is persistent bleeding from the suture or staple line.  °You are getting worse or are not improving.  °You have any other questions or concerns.  °SEEK IMMEDIATE MEDICAL CARE IF:  °You have a fever.  °You develop a rash.  °You have difficulty breathing.  °You develop any reaction or side effects to medicines given.  °Your knee motion is decreasing rather than improving.  °MAKE SURE YOU:  °Understand these instructions.  °Will watch your condition.  °Will get help right away if you are not doing well or get worse.  °

## 2013-07-12 NOTE — Evaluation (Signed)
Physical Therapy Evaluation Patient Details Name: Luke Golden A Vonderhaar MRN: [id number] DOB: [birth year] Today's Date: 07/12/2013 Time: [id number] PT Time Calculation (min): 48 min  PT Assessment / Plan / Recommendation History of Present Illness  Pt presents for right TKA. H/o left TKA  Clinical Impression  Pt mobilized bed to chair with RW and min A, pt did not ambulate due to dizziness and nausea, VSS. Pt reports that he will not have help at home this time and will need to go to rehab. Would like SNF in [place]. PT will follow to progress mobility as well as ROM and strength of RLE.     PT Assessment  Patient needs continued PT services    Follow Up Recommendations  SNF;Supervision for mobility/OOB    Does the patient have the potential to tolerate intense rehabilitation      Barriers to Discharge Decreased caregiver support      Equipment Recommendations  None recommended by PT    Recommendations for Other Services OT consult   Frequency 7X/week    Precautions / Restrictions Precautions Precautions: Knee Precaution Comments: reviewed proper knee positioning in bed and with foam roll Required Braces or Orthoses: Knee Immobilizer - Right Knee Immobilizer - Right: On except when in CPM Restrictions Weight Bearing Restrictions: Yes RLE Weight Bearing: Weight bearing as tolerated   Pertinent Vitals/Pain VSS     Mobility  Bed Mobility Overal bed mobility: Needs Assistance Bed Mobility: Sidelying to Sit Sidelying to sit: Supervision;HOB elevated General bed mobility comments: pt needed supervisin due to becoming dizzy when beginning to move, but was able to get to EOB with use of rail and HOB elevated, no physical assist Transfers Overall transfer level: Needs assistance Equipment used: Rolling walker (2 wheeled) Transfers: Sit to/from UGI CorporationStand;Stand Pivot Transfers Sit to Stand: Min assist Stand pivot transfers: Min assist General transfer comment: min A to steady due to pt  dizziness. vc's for safe use of RW Ambulation/Gait General Gait Details: NT due to dizziness and nausea    Exercises Total Joint Exercises Ankle Circles/Pumps: AROM;Both;10 reps;Seated Quad Sets: AROM;Both;10 reps;Seated   PT Diagnosis: Difficulty walking;Acute pain;Abnormality of gait  PT Problem List: Decreased strength;Decreased range of motion;Decreased activity tolerance;Decreased balance;Decreased mobility;Decreased knowledge of use of DME;Pain;Decreased knowledge of precautions PT Treatment Interventions: DME instruction;Gait training;Functional mobility training;Therapeutic activities;Therapeutic exercise;Patient/family education;Balance training;Neuromuscular re-education     PT Goals(Current goals can be found in the care plan section) Acute Rehab PT Goals Patient Stated Goal: return home after rehab PT Goal Formulation: With patient Time For Goal Achievement: 07/19/13 Potential to Achieve Goals: Good  Visit Information  Last PT Received On: 07/12/13 Assistance Needed: +2 (if still dizzy, otherwise +1) History of Present Illness: Pt presents for right TKA. H/o left TKA       Prior Functioning  Home Living Family/patient expects to be discharged to:: Private residence Living Arrangements: Spouse/significant other Available Help at Discharge: Family;Available PRN/intermittently Type of Home: House Home Access: Stairs to enter Entergy CorporationEntrance Stairs-Number of Steps: 4 Entrance Stairs-Rails: Right;Left;Can reach both Home Layout: One level Home Equipment: Walker - 2 wheels;Cane - single point;Bedside commode;Grab bars - tub/shower Additional Comments: pt's daughter helped him after first surgery but cannot do so after this one and wife apparently cannot assist, pt agreeable to SNF for rehab, would prefer to stay in [place] area Prior Function Level of Independence: Independent Communication Communication: No difficulties    Cognition  Cognition Arousal/Alertness:  Awake/alert Behavior During Therapy: WFL for tasks assessed/performed Overall Cognitive Status: Within  Functional Limits for tasks assessed    Extremity/Trunk Assessment Upper Extremity Assessment Upper Extremity Assessment: Overall WFL for tasks assessed Lower Extremity Assessment Lower Extremity Assessment: RLE deficits/detail;LLE deficits/detail RLE Deficits / Details: quad 2/5, hip flex 2/5 LLE Deficits / Details: WFL, good return of strength after TKA Cervical / Trunk Assessment Cervical / Trunk Assessment: Normal   Balance Balance Overall balance assessment: Needs assistance Sitting-balance support: No upper extremity supported;Feet supported Sitting balance-[name] Scale: Good Standing balance support: Bilateral upper extremity supported;During functional activity Standing balance-[name] Scale: Poor Standing balance comment: due to dizziness  End of Session PT - End of Session Equipment Utilized During Treatment: Gait belt;Right knee immobilizer Activity Tolerance: Other (comment) (limited by dizziness) Patient left: in chair;with call bell/phone within reach;with family/visitor present Nurse Communication: Mobility status CPM Right Knee CPM Right Knee: Off Right Knee Flexion (Degrees): 60 Right Knee Extension (Degrees): 0 Additional Comments: Trapeze bar foot roll  GP   Lyanne Co, PT  Acute Rehab Services  [phone]'  Catrinia Racicot, [place] 07/12/2013, 2:04 PM

## 2013-07-13 ENCOUNTER — Encounter (HOSPITAL_COMMUNITY): Payer: Self-pay | Admitting: Orthopedic Surgery

## 2013-07-13 LAB — BASIC METABOLIC PANEL
BUN: 13 mg/dL (ref 6–23)
CALCIUM: 8.3 mg/dL — AB (ref 8.4–10.5)
CO2: 22 mEq/L (ref 19–32)
Chloride: 101 mEq/L (ref 96–112)
Creatinine, Ser: 0.99 mg/dL (ref 0.50–1.35)
GFR, EST NON AFRICAN AMERICAN: 82 mL/min — AB (ref >90)
GLUCOSE: 167 mg/dL — AB (ref 70–99)
POTASSIUM: 4.6 meq/L (ref 3.7–5.3)
Sodium: 134 mEq/L — ABNORMAL LOW (ref 137–147)

## 2013-07-13 LAB — CBC
HCT: 32.2 % — ABNORMAL LOW (ref 39.0–52.0)
HEMOGLOBIN: 11.1 g/dL — AB (ref 13.0–17.0)
MCH: 32 pg (ref 26.0–34.0)
MCHC: 34.5 g/dL (ref 30.0–36.0)
MCV: 92.8 fL (ref 78.0–100.0)
Platelets: 146 10*3/uL — ABNORMAL LOW (ref 150–400)
RBC: 3.47 MIL/uL — ABNORMAL LOW (ref 4.22–5.81)
RDW: 12.6 % (ref 11.5–15.5)
WBC: 8.1 10*3/uL (ref 4.0–10.5)

## 2013-07-13 NOTE — Progress Notes (Signed)
Subjective: 1 Day Post-Op Procedure(s) (LRB): TOTAL KNEE ARTHROPLASTY (Right) Patient reports pain as 3 on 0-10 scale.  No nausea/vomiting.  Positive flatus, but no bm as of yet.  Tolerating diet.     Objective: Vital signs in last 24 hours: Temp:  [97.1 F (36.2 C)-98.4 F (36.9 C)] 98 F (36.7 C) (02/12 0548) Pulse Rate:  [60-76] 70 (02/12 0548) Resp:  [10-20] 16 (02/12 0548) BP: (123-149)/(42-72) 128/60 mmHg (02/12 0548) SpO2:  [95 %-100 %] 95 % (02/12 0548)  Intake/Output from previous day: 02/11 0701 - 02/12 0700 In: 2590 [P.O.:840; I.V.:1750] Out: 2675 [Urine:1900; Drains:775] Intake/Output this shift: Total I/O In: 480 [P.O.:480] Out: 2075 [Urine:1900; Drains:175]   Recent Labs  07/12/13 1350 07/13/13 0424  HGB 13.6 11.1*    Recent Labs  07/12/13 1350 07/13/13 0424  WBC 12.3* 8.1  RBC 4.24 3.47*  HCT 39.9 32.2*  PLT 177 146*    Recent Labs  07/12/13 1350 07/13/13 0424  NA  --  134*  K  --  4.6  CL  --  101  CO2  --  22  BUN  --  13  CREATININE 1.09 0.99  GLUCOSE  --  167*  CALCIUM  --  8.3*   No results found for this basename: LABPT, INR,  in the last 72 hours  Neurologically intact ABD soft Neurovascular intact Sensation intact distally Intact pulses distally Dorsiflexion/Plantar flexion intact Compartment soft hemovac drain pulled by me today  Assessment/Plan: 1 Day Post-Op Procedure(s) (LRB): TOTAL KNEE ARTHROPLASTY (Right) Advance diet Up with therapy D/C IV fluids Plan for discharge tomorrow Discharge to SNF Dressing change prn D/C IV fluids  ANTON, M. LINDSEY 07/13/2013, 6:23 AM

## 2013-07-13 NOTE — Evaluation (Signed)
Occupational Therapy Evaluation and Discharge Patient Details Name: Luke Golden MRN: [id number] DOB: August 17, 1945 Today's Date: 07/13/2013 Time: 4098-11911050-1111 OT Time Calculation (min): 21 min  OT Assessment / Plan / Recommendation History of present illness Pt presents for right TKA. H/o left TKA 2 years ago   Clinical Impression   This 68 yo male admitted and underwent above presents to acute OT with all education completed, will D/C from acute OT.    OT Assessment  Patient does not need any further OT services    Follow Up Recommendations  No OT follow up       Equipment Recommendations  None recommended by OT          Precautions / Restrictions Precautions Precautions: Knee Required Braces or Orthoses: Knee Immobilizer - Right Knee Immobilizer - Right: On except when in CPM Restrictions Weight Bearing Restrictions: Yes RLE Weight Bearing: Weight bearing as tolerated   Pertinent Vitals/Pain No c/o pain    ADL  Equipment Used:  (None, pt wanted to ambulate without RW (said he had done it earlier in room); surprisingly he did great around the room without RW) Transfers/Ambulation Related to ADLs: mod I (increased time) ADL Comments: Mod I sit<>stand     Acute Rehab OT Goals Patient Stated Goal: home tomorrow  Visit Information  Last OT Received On: 07/13/13 Assistance Needed: +1 History of Present Illness: Pt presents for right TKA. H/o left TKA 2 years ago       Prior Functioning     Home Living Family/patient expects to be discharged to:: Private residence Living Arrangements: Spouse/significant other Available Help at Discharge: Family;Available PRN/intermittently Type of Home: House Home Access: Stairs to enter Entergy CorporationEntrance Stairs-Number of Steps: 4 Entrance Stairs-Rails: Right;Left;Can reach both Home Layout: One level Home Equipment: Walker - 2 wheels;Cane - single point;Bedside commode;Grab bars - tub/shower Additional Comments: pt's daughter helped  him after first surgery but cannot do so after this one and wife apparently cannot assist, pt agreeable to SNF for rehab, would prefer to stay in [place] area; 07/13/13 plan is now home Prior Function Level of Independence: Independent Communication Communication: No difficulties Dominant Hand: Right         Vision/Perception Vision - History Patient Visual Report: No change from baseline   Cognition  Cognition Arousal/Alertness: Awake/alert Behavior During Therapy: WFL for tasks assessed/performed Overall Cognitive Status: Within Functional Limits for tasks assessed    Extremity/Trunk Assessment Upper Extremity Assessment Upper Extremity Assessment: Overall WFL for tasks assessed     Mobility Bed Mobility Overal bed mobility: Needs Assistance Bed Mobility: Supine to Sit Supine to sit: Supervision General bed mobility comments: Pt up in recliner upon my arriva Transfers Overall transfer level: Modified independent Equipment used: None Transfers: Sit to/from Stand Sit to Stand: Supervision General transfer comment: Cues to slow down as pt coming to stand while PT grabbing KI.  pt ed on putting on KI prior to standing and having RW present prior to standing.          Balance Balance Overall balance assessment: Needs assistance Standing balance support: Bilateral upper extremity supported;No upper extremity supported Standing balance-[name] Scale: Fair   End of Session OT - End of Session Activity Tolerance: Patient tolerated treatment well Patient left: in chair;with call bell/phone within reach        Luke Golden, Luke Golden [phone] 07/13/2013, 11:30 AM

## 2013-07-13 NOTE — Progress Notes (Addendum)
Physical Therapy Treatment Patient Details Name: Luke Golden MRN: [id number] DOB: March 23, 1946 Today's Date: 07/13/2013 Time: 2956-21300836-0904 PT Time Calculation (min): 28 min  PT Assessment / Plan / Recommendation  History of Present Illness Pt presents for right TKA. H/o left TKA 2 years ago   PT Comments   Pt very motivated and actually needs cues on having a time to rest and elevate LE.  Will continue to follow.    Follow Up Recommendations  Home health PT;Supervision for mobility/OOB     Does the patient have the potential to tolerate intense rehabilitation     Barriers to Discharge        Equipment Recommendations  None recommended by PT    Recommendations for Other Services    Frequency 7X/week   Progress towards PT Goals Progress towards PT goals: Progressing toward goals  Plan Discharge plan needs to be updated    Precautions / Restrictions Precautions Precautions: Knee Required Braces or Orthoses: Knee Immobilizer - Right Knee Immobilizer - Right: On except when in CPM Restrictions Weight Bearing Restrictions: Yes RLE Weight Bearing: Weight bearing as tolerated   Pertinent Vitals/Pain 2/10 in R knee.      Mobility  Bed Mobility Overal bed mobility: Needs Assistance Bed Mobility: Supine to Sit Supine to sit: Supervision General bed mobility comments: Pt up in recliner upon my arriva Transfers Overall transfer level: Modified independent Equipment used: None Transfers: Sit to/from Stand Sit to Stand: Supervision General transfer comment: Cues to slow down as pt coming to stand while PT grabbing KI.  pt ed on putting on KI prior to standing and having RW present prior to standing.   Ambulation/Gait Ambulation/Gait assistance: Min guard Ambulation Distance (Feet): 500 Feet Assistive device: Rolling walker (2 wheeled) Gait Pattern/deviations: Step-through pattern;Decreased step length - left;Decreased stance time - right General Gait Details: cues to slow down  and attend to safety.  pt very talkative and tends to start increasing speed as he is talking.      Exercises Total Joint Exercises Hip ABduction/ADduction: AROM;Right;10 reps Long Arc Quad: AROM;Right;10 reps Knee Flexion: AROM;Right;10 reps   PT Diagnosis:    PT Problem List:   PT Treatment Interventions:     PT Goals (current goals can now be found in the care plan section) Acute Rehab PT Goals Patient Stated Goal: home tomorrow Time For Goal Achievement: 07/19/13 Potential to Achieve Goals: Good  Visit Information  Last PT Received On: 07/13/13 Assistance Needed: +1 History of Present Illness: Pt presents for right TKA. H/o left TKA 2 years ago    Subjective Data  Subjective: I'm going to do as much as I can!   Patient Stated Goal: home tomorrow   Cognition  Cognition Arousal/Alertness: Awake/alert Behavior During Therapy: WFL for tasks assessed/performed Overall Cognitive Status: Within Functional Limits for tasks assessed    Balance  Balance Overall balance assessment: Needs assistance Standing balance support: Bilateral upper extremity supported;No upper extremity supported Standing balance-[name] Scale: Fair  End of Session PT - End of Session Equipment Utilized During Treatment: Gait belt;Right knee immobilizer Activity Tolerance: Patient tolerated treatment well Patient left: in chair;with call bell/phone within reach Nurse Communication: Mobility status CPM Right Knee CPM Right Knee: Off   GP     Sunny SchleinRitenour, Chayna Surratt F, [place]PT [phone] 07/13/2013, 12:09 PM

## 2013-07-13 NOTE — Progress Notes (Signed)
CSW received referral to see short term SNF for patient and [facility] administration was contacted to initiate authorization with Luke PoundsSusan Golden  [phone] Ext [phone]. CSW was notified this afternoon by Luke NiemannAmy Golden, LCSWA that PT is recommending home health PT and that patient will return home at d/c.  CSW is very appreciative of notification and will sign off.  RNCM - Luke Golden is aware of above and will complete d/c plan when medically stable.  Luke Golden, LCSWA  [phone]

## 2013-07-13 NOTE — Progress Notes (Signed)
Physical Therapy Treatment Patient Details Name: Luke Golden MRN: [id number] DOB: [birth year] Today's Date: 07/13/2013 Time: [id number] PT Time Calculation (min): 10 min  PT Assessment / Plan / Recommendation  History of Present Illness Pt presents for right TKA. H/o left TKA 2 years ago   PT Comments   Pt up amb in room without KI or RW.  Pt stating he doesn't need them, so continued PT without RW or KI.  Pt demos good mobility, though tends to get distracted talking and needs cueing to slow down.    Follow Up Recommendations  Home health PT;Supervision for mobility/OOB     Does the patient have the potential to tolerate intense rehabilitation     Barriers to Discharge        Equipment Recommendations  None recommended by PT    Recommendations for Other Services    Frequency 7X/week   Progress towards PT Goals Progress towards PT goals: Progressing toward goals  Plan Current plan remains appropriate    Precautions / Restrictions Precautions Precautions: Knee Precaution Comments: pt up in room without KI or RW.   Required Braces or Orthoses: Knee Immobilizer - Right Knee Immobilizer - Right: On except when in CPM Restrictions Weight Bearing Restrictions: Yes RLE Weight Bearing: Weight bearing as tolerated   Pertinent Vitals/Pain 2/10 during ambulation.      Mobility  Bed Mobility General bed mobility comments: Pt up in recliner upon my arriva Transfers Overall transfer level: Modified independent Equipment used: None Transfers: Sit to/from Stand Sit to Stand: Modified independent (Device/Increase time) General transfer comment: pt sitting on couch by window on arrival without KI or RW.  pt staood and amb to greet PT.   Ambulation/Gait Ambulation/Gait assistance: Modified independent (Device/Increase time) Ambulation Distance (Feet): 300 Feet Assistive device: None Gait Pattern/deviations: Step-through pattern;Decreased step length - left;Decreased stance time -  right General Gait Details: pt antalgic, but demos good strength.  Amb without KI or RW as pt states he won't be using them at home.  pt does demo good safety with mobility, though does need cues for pacing.   Stairs: Yes Stairs assistance: Modified independent (Device/Increase time) Stair Management: No rails;One rail Right;Alternating pattern;Step to pattern;Forwards Number of Stairs: 5 General stair comments: pt able to amb with or without rails.  cues to slow down and attend to task.      Exercises     PT Diagnosis:    PT Problem List:   PT Treatment Interventions:     PT Goals (current goals can now be found in the care plan section) Acute Rehab PT Goals Patient Stated Goal: home tomorrow Time For Goal Achievement: 07/19/13 Potential to Achieve Goals: Good  Visit Information  Last PT Received On: 07/13/13 Assistance Needed: +1 History of Present Illness: Pt presents for right TKA. H/o left TKA 2 years ago    Subjective Data  Patient Stated Goal: home tomorrow   Cognition  Cognition Arousal/Alertness: Awake/alert Behavior During Therapy: WFL for tasks assessed/performed Overall Cognitive Status: Within Functional Limits for tasks assessed    Balance  Balance Overall balance assessment: Modified Independent  End of Session PT - End of Session Equipment Utilized During Treatment: Gait belt Activity Tolerance: Patient tolerated treatment well Patient left: in chair;with call bell/phone within reach Nurse Communication: Mobility status   GP     Sunny SchleinRitenour, Janiqua Friscia F, [place] [phone] 07/13/2013, 1:52 PM

## 2013-07-13 NOTE — Op Note (Signed)
Luke Golden:  Golden, Luke Golden                ACCOUNT NO.:  [id number]  MEDICAL RECORD NO.:  [id number]  LOCATION:  [id number]                        FACILITY:  [facility]  PHYSICIAN:  Loreta Aveaniel F. Atiyah Bauer, M.D. DATE OF BIRTH:  [birth year]  DATE OF PROCEDURE:  07/12/2013 DATE OF DISCHARGE:                              OPERATIVE REPORT   PREOPERATIVE DIAGNOSIS:  Right knee end-stage degenerative arthritis, varus alignment.  POSTOPERATIVE DIAGNOSIS:  Right knee end-stage degenerative arthritis, varus alignment.  PROCEDURE:  Right knee modified minimally invasive total knee replacement with Stryker triathlon prosthesis.  Soft tissue balancing. Cemented pegged posterior stabilized #6 femoral component.  Cemented #6 tibial component, 9 mm polyethylene insert.  Cemented resurfacing 38-mm patellar component.  SURGEON:  Loreta Aveaniel F. Markas Aldredge, M.D.  ASSISTANT:  Odelia GageLindsey Anton, PA, present throughout the entire case and necessary for timely completion of procedure.  ANESTHESIA:  General.  BLOOD LOSS:  Minimal.  SPECIMENS:  None.  CULTURES:  None.  COMPLICATIONS:  None.  DRESSINGS:  Soft compressive knee immobilizer.  DRAINS:  Hemovac x1.  TOURNIQUET TIME:  1 hour.  PROCEDURE:  The patient was brought to the operating room, placed on the operating table in a supine position.  After an adequate level of anesthesia had been obtained, tourniquet applied, prepped and draped in usual sterile fashion.  Exsanguinated with elevation of Esmarch. Tourniquet inflated to 350 mmHg.  Straight incision above the patella down to tibial tubercle.  Skin and subcutaneous tissue divided.  Medial arthrotomy, vastus splitting, preserving quad tendon.  Medial capsule released.  Knee exposed.  Remnants of menisci, loose body, spurs, cruciate ligaments excised.  Distal femur exposed.  Intramedullary guide placed.  Flexible rods 5 degrees of valgus.  8 mm resection.  Using epicondylar axis, the femur was sized, cut, and fitted for  a pegged posterior stabilized #6 component.  Proximal tibial resection. Extramedullary guide.  3-degree posterior slope cut.  Size 6 component. Patella exposed.  Posterior 10 mm removed.  Drilled, sized, fitted for a 38-mm component.  Debris cleared throughout the knee at all recesses. Trials were put in place.  With the 9 mm insert, I was very pleased with balancing, stability, and flexion/extension.  Good patellar tracking. Tibia was marked for rotation and hand reamed.  All trials removed. Copious irrigation with pulse irrigating device.  Cement prepared, placed on all components, firmly seated.  Polyethylene attached to the tibia, knee reduced.  Patella held with a clamp.  Once the cement hardened, the knee was examined once again.  Soft tissues were injected with Exparel.  Hemovac was placed and brought out through a separate stab wound.  Arthrotomy closed with #1 Vicryl.  Skin and subcutaneous tissue with subcutaneous subcuticular closure.  Margins were injected with Marcaine.  Sterile compressive dressing applied.  Tourniquet deflated and removed.  Knee immobilizer applied.  Anesthesia reversed. Brought to the recovery room.  Tolerated the surgery well.  No complications.     Loreta Aveaniel F. Shakeisha Horine, M.D.     DFM/MEDQ  D:  07/12/2013  T:  07/13/2013  Job:  [id number]

## 2013-07-13 NOTE — Care Management Note (Addendum)
CARE MANAGEMENT NOTE 07/13/2013  Patient:  Luke Golden,Luke Golden   Account Number:  [id number]  Date Initiated:  07/12/2013  Documentation initiated by:  Vance PeperBRADY,Nova Schmuhl  Subjective/Objective Assessment:   68 yr old male s/p right total knee arthroplasty.     Action/Plan:   PT/OT eval.   Patient is covered by Veteran's Adminisatration.  patient has rolling walker, 3in1. CPM to be delivered.   Anticipated DC Date:  07/14/2013   Anticipated DC Plan:  HOME W HOME HEALTH SERVICES  In-house referral  Clinical Social Worker      DC Planning Services  CM consult      [facility]  [facility]ALTH  DURABLE MEDICAL EQUIPMENT   Choice offered to / List presented to:  C-1 Patient   DME arranged  CPM      DME agency  [employer]     HH arranged  HH-2 PT      HH agency  [facility]   Status of service:  Completed, signed off Medicare Important Message given?   (If response is "NO", the following Medicare IM given date fields will be blank) Date Medicare IM given:   Date Additional Medicare IM given:    Discharge Disposition:  HOME W HOME HEALTH SERVICES  Per UR Regulation:    If discussed at Long Length of Stay Meetings, dates discussed:    Comments:  07/13/13  12:00pm  Vance PeperSusan Jabe Jeanbaptiste, RN BSN Case Manager Case Manager spoke with patient concerning arranging home health needs. CM also spoke with [facility] social worker-Krystal Teachey Carlton-[phone] ext. [phone]. Instructed to arrange home health and have providing agency call her for authorization. [facility], [facility].

## 2013-07-14 LAB — BASIC METABOLIC PANEL
BUN: 21 mg/dL (ref 6–23)
CHLORIDE: 105 meq/L (ref 96–112)
CO2: 24 mEq/L (ref 19–32)
Calcium: 8.5 mg/dL (ref 8.4–10.5)
Creatinine, Ser: 1.27 mg/dL (ref 0.50–1.35)
GFR calc non Af Amer: 56 mL/min — ABNORMAL LOW (ref >90)
GFR, EST AFRICAN AMERICAN: 65 mL/min — AB (ref >90)
Glucose, Bld: 106 mg/dL — ABNORMAL HIGH (ref 70–99)
Potassium: 4.3 mEq/L (ref 3.7–5.3)
Sodium: 138 mEq/L (ref 137–147)

## 2013-07-14 LAB — CBC
HCT: 29.2 % — ABNORMAL LOW (ref 39.0–52.0)
Hemoglobin: 10.2 g/dL — ABNORMAL LOW (ref 13.0–17.0)
MCH: 32.7 pg (ref 26.0–34.0)
MCHC: 34.9 g/dL (ref 30.0–36.0)
MCV: 93.6 fL (ref 78.0–100.0)
PLATELETS: 148 10*3/uL — AB (ref 150–400)
RBC: 3.12 MIL/uL — ABNORMAL LOW (ref 4.22–5.81)
RDW: 12.8 % (ref 11.5–15.5)
WBC: 11.4 10*3/uL — AB (ref 4.0–10.5)

## 2013-07-14 NOTE — Progress Notes (Signed)
PT Cancellation Note  Patient Details Name: Luke Golden MRN: [id number] DOB: 1945/06/06   Cancelled Treatment:    Reason Eval/Treat Not Completed: Other (comment) (Pt has no further acute PT needs at this time.).  Pt is ambulating independently around his room, has been educated on stairs, does not want to review his exercises.  Pt has no further acute PT needs.  All further needs can be addressed by HHPT.  PT to sign off.  Thanks,   Rollene Rotundaebecca B. Laaibah Wartman, PT, DPT [phone]   07/14/2013, 10:10 AM

## 2013-07-14 NOTE — Addendum Note (Signed)
Addendum created 07/14/13 1116 by Bedelia PersonLee Tyree Fluharty, MD   Modules edited: Anesthesia Attestations

## 2013-07-14 NOTE — Progress Notes (Signed)
Pt discharged to home. D/c instructions given, no questions verbalized. Vitals stable. Pt ambulatory. 

## 2013-07-14 NOTE — Progress Notes (Signed)
Subjective: 2 Days Post-Op Procedure(s) (LRB): TOTAL KNEE ARTHROPLASTY (Right) Patient reports pain as 2 on 0-10 scale.  Patient is doing extremely well with PT.   No nausea/vomiting.  Positive flatus and bm.  Tolerating diet.  Objective: Vital signs in last 24 hours: Temp:  [97.5 F (36.4 C)-98.3 F (36.8 C)] 98 F (36.7 C) (02/13 0509) Pulse Rate:  [59-85] 85 (02/13 0509) Resp:  [16-20] 18 (02/13 0509) BP: (101-133)/(43-54) 117/48 mmHg (02/13 0509) SpO2:  [97 %-100 %] 97 % (02/13 0509) Weight:  [96.163 kg (212 lb)] 96.163 kg (212 lb) (02/12 0900)  Intake/Output from previous day: 02/12 0701 - 02/13 0700 In: 240 [P.O.:240] Out: 950 [Urine:950] Intake/Output this shift:     Recent Labs  07/12/13 1350 07/13/13 0424  HGB 13.6 11.1*    Recent Labs  07/12/13 1350 07/13/13 0424  WBC 12.3* 8.1  RBC 4.24 3.47*  HCT 39.9 32.2*  PLT 177 146*    Recent Labs  07/12/13 1350 07/13/13 0424  NA  --  134*  K  --  4.6  CL  --  101  CO2  --  22  BUN  --  13  CREATININE 1.09 0.99  GLUCOSE  --  167*  CALCIUM  --  8.3*   No results found for this basename: LABPT, INR,  in the last 72 hours  Neurologically intact ABD soft Neurovascular intact Sensation intact distally Intact pulses distally Dorsiflexion/Plantar flexion intact Incision: moderate drainage No cellulitis present Compartment soft Dressing changed by me today  Assessment/Plan: 2 Days Post-Op Procedure(s) (LRB): TOTAL KNEE ARTHROPLASTY (Right) Advance diet Up with therapy Discharge home with home health as patient and his wife have decided against SNF  ANTON, M. LINDSEY 07/14/2013, 7:30 AM

## 2013-07-14 NOTE — Care Management Note (Signed)
07/14/13 10:43 Vance PeperSusan  Cristela Stalder, RN BSN CM faxed discharge summary and home health orders to Dr. [phone].

## 2014-04-23 ENCOUNTER — Emergency Department (HOSPITAL_COMMUNITY)
Admission: EM | Admit: 2014-04-23 | Discharge: 2014-04-23 | Disposition: A | Discharge status: Home / Self Care | Payer: Medicare Other | Attending: Emergency Medicine | Admitting: Emergency Medicine

## 2014-04-23 ENCOUNTER — Emergency Department (HOSPITAL_COMMUNITY): Payer: Medicare Other

## 2014-04-23 ENCOUNTER — Encounter (HOSPITAL_COMMUNITY): Payer: Self-pay | Admitting: Emergency Medicine

## 2014-04-23 DIAGNOSIS — F419 Anxiety disorder, unspecified: Secondary | ICD-10-CM | POA: Insufficient documentation

## 2014-04-23 DIAGNOSIS — Z8701 Personal history of pneumonia (recurrent): Secondary | ICD-10-CM | POA: Insufficient documentation

## 2014-04-23 DIAGNOSIS — R0789 Other chest pain: Secondary | ICD-10-CM | POA: Insufficient documentation

## 2014-04-23 DIAGNOSIS — I10 Essential (primary) hypertension: Secondary | ICD-10-CM | POA: Insufficient documentation

## 2014-04-23 DIAGNOSIS — R079 Chest pain, unspecified: Secondary | ICD-10-CM

## 2014-04-23 DIAGNOSIS — Z8709 Personal history of other diseases of the respiratory system: Secondary | ICD-10-CM | POA: Insufficient documentation

## 2014-04-23 DIAGNOSIS — Z8781 Personal history of (healed) traumatic fracture: Secondary | ICD-10-CM | POA: Insufficient documentation

## 2014-04-23 DIAGNOSIS — M1990 Unspecified osteoarthritis, unspecified site: Secondary | ICD-10-CM | POA: Insufficient documentation

## 2014-04-23 DIAGNOSIS — Z9889 Other specified postprocedural states: Secondary | ICD-10-CM | POA: Insufficient documentation

## 2014-04-23 DIAGNOSIS — Z87891 Personal history of nicotine dependence: Secondary | ICD-10-CM | POA: Insufficient documentation

## 2014-04-23 DIAGNOSIS — Z7901 Long term (current) use of anticoagulants: Secondary | ICD-10-CM | POA: Insufficient documentation

## 2014-04-23 DIAGNOSIS — Z79899 Other long term (current) drug therapy: Secondary | ICD-10-CM | POA: Insufficient documentation

## 2014-04-23 DIAGNOSIS — Z7982 Long term (current) use of aspirin: Secondary | ICD-10-CM | POA: Insufficient documentation

## 2014-04-23 DIAGNOSIS — R531 Weakness: Secondary | ICD-10-CM

## 2014-04-23 LAB — CBC
HEMATOCRIT: 39.4 % (ref 39.0–52.0)
Hemoglobin: 13.4 g/dL (ref 13.0–17.0)
MCH: 31 pg (ref 26.0–34.0)
MCHC: 34 g/dL (ref 30.0–36.0)
MCV: 91.2 fL (ref 78.0–100.0)
Platelets: 165 10*3/uL (ref 150–400)
RBC: 4.32 MIL/uL (ref 4.22–5.81)
RDW: 12.5 % (ref 11.5–15.5)
WBC: 5.4 10*3/uL (ref 4.0–10.5)

## 2014-04-23 LAB — I-STAT TROPONIN, ED
TROPONIN I, POC: 0.01 ng/mL (ref 0.00–0.08)
Troponin i, poc: 0.01 ng/mL (ref 0.00–0.08)

## 2014-04-23 LAB — BASIC METABOLIC PANEL
Anion gap: 10 (ref 5–15)
BUN: 18 mg/dL (ref 6–23)
CHLORIDE: 104 meq/L (ref 96–112)
CO2: 27 mEq/L (ref 19–32)
Calcium: 9.1 mg/dL (ref 8.4–10.5)
Creatinine, Ser: 0.98 mg/dL (ref 0.50–1.35)
GFR calc non Af Amer: 83 mL/min — ABNORMAL LOW (ref >90)
Glucose, Bld: 112 mg/dL — ABNORMAL HIGH (ref 70–99)
POTASSIUM: 4.4 meq/L (ref 3.7–5.3)
SODIUM: 141 meq/L (ref 137–147)

## 2014-04-23 NOTE — ED Notes (Signed)
Pt from home for eval of 1/10 substernal cp that began yesterday and has been intermittent ever since. Pt was evaluated yesterday by EMS but declined transportation to the hospital. Pt states he was walking his dog this morning when he got chest pain with dizziness and weakness. Pt given 324 mg of aspirin and 3 nitro with minimal relief. Pt denies any cp at this time. EKG unremarkable per eMS. nad noted. Pt axox 4.
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
## 2014-04-23 NOTE — Discharge Instructions (Signed)
Take Nexium, twice a day for 1 week, then once a day for 1 month. Use Maalox or Mylanta before meals and at bedtime, if needed for additional control of symptoms. Return here if needed, for problems.    Chest Pain (Nonspecific) It is often hard to give a specific diagnosis for the cause of chest pain. There is always a chance that your pain could be related to something serious, such as a heart attack or a blood clot in the lungs. You need to follow up with your health care provider for further evaluation. CAUSES   Heartburn.  Pneumonia or bronchitis.  Anxiety or stress.  Inflammation around your heart (pericarditis) or lung (pleuritis or pleurisy).  A blood clot in the lung.  A collapsed lung (pneumothorax). It can develop suddenly on its own (spontaneous pneumothorax) or from trauma to the chest.  Shingles infection (herpes zoster virus). The chest wall is composed of bones, muscles, and cartilage. Any of these can be the source of the pain.  The bones can be bruised by injury.  The muscles or cartilage can be strained by coughing or overwork.  The cartilage can be affected by inflammation and become sore (costochondritis). DIAGNOSIS  Lab tests or other studies may be needed to find the cause of your pain. Your health care provider may have you take a test called an ambulatory electrocardiogram (ECG). An ECG records your heartbeat patterns over a 24-hour period. You may also have other tests, such as:  Transthoracic echocardiogram (TTE). During echocardiography, sound waves are used to evaluate how blood flows through your heart.  Transesophageal echocardiogram (TEE).  Cardiac monitoring. This allows your health care provider to monitor your heart rate and rhythm in real time.  Holter monitor. This is a portable device that records your heartbeat and can help diagnose heart arrhythmias. It allows your health care provider to track your heart activity for several days, if  needed.  Stress tests by exercise or by giving medicine that makes the heart beat faster. TREATMENT   Treatment depends on what may be causing your chest pain. Treatment may include:  Acid blockers for heartburn.  Anti-inflammatory medicine.  Pain medicine for inflammatory conditions.  Antibiotics if an infection is present.  You may be advised to change lifestyle habits. This includes stopping smoking and avoiding alcohol, caffeine, and chocolate.  You may be advised to keep your head raised (elevated) when sleeping. This reduces the chance of acid going backward from your stomach into your esophagus. Most of the time, nonspecific chest pain will improve within 2-3 days with rest and mild pain medicine.  HOME CARE INSTRUCTIONS   If antibiotics were prescribed, take them as directed. Finish them even if you start to feel better.  For the next few days, avoid physical activities that bring on chest pain. Continue physical activities as directed.  Do not use any tobacco products, including cigarettes, chewing tobacco, or electronic cigarettes.  Avoid drinking alcohol.  Only take medicine as directed by your health care provider.  Follow your health care provider's suggestions for further testing if your chest pain does not go away.  Keep any follow-up appointments you made. If you do not go to an appointment, you could develop lasting (chronic) problems with pain. If there is any problem keeping an appointment, call to reschedule. SEEK MEDICAL CARE IF:   Your chest pain does not go away, even after treatment.  You have a rash with blisters on your chest.  You have a  fever. SEEK IMMEDIATE MEDICAL CARE IF:   You have increased chest pain or pain that spreads to your arm, neck, jaw, back, or abdomen.  You have shortness of breath.  You have an increasing cough, or you cough up blood.  You have severe back or abdominal pain.  You feel nauseous or vomit.  You have severe  weakness.  You faint.  You have chills. This is an emergency. Do not wait to see if the pain will go away. Get medical help at once. Call your local emergency services (911 in [place] Do not drive yourself to the hospital. MAKE SURE YOU:   Understand these instructions.  Will watch your condition.  Will get help right away if you are not doing well or get worse. Document Released: 02/25/2005 Document Revised: 05/23/2013 Document Reviewed: 12/22/2007 [facility] Patient Information 2015 [place], [place]. This information is not intended to replace advice given to you by your health care provider. Make sure you discuss any questions you have with your health care provider.  Weakness Weakness is a lack of strength. It may be felt all over the body (generalized) or in one specific part of the body (focal). Some causes of weakness can be serious. You may need further medical evaluation, especially if you are elderly or you have a history of immunosuppression (such as chemotherapy or HIV), kidney disease, heart disease, or diabetes. CAUSES  Weakness can be caused by many different things, including:  Infection.  Physical exhaustion.  Internal bleeding or other blood loss that results in a lack of red blood cells (anemia).  Dehydration. This cause is more common in elderly people.  Side effects or electrolyte abnormalities from medicines, such as pain medicines or sedatives.  Emotional distress, anxiety, or depression.  Circulation problems, especially severe peripheral arterial disease.  Heart disease, such as rapid atrial fibrillation, bradycardia, or heart failure.  Nervous system disorders, such as Guillain-Barr syndrome, multiple sclerosis, or stroke. DIAGNOSIS  To find the cause of your weakness, your caregiver will take your history and perform a physical exam. Lab tests or X-rays may also be ordered, if needed. TREATMENT  Treatment of weakness depends on the cause of your  symptoms and can vary greatly. HOME CARE INSTRUCTIONS   Rest as needed.  Eat a well-balanced diet.  Try to get some exercise every day.  Only take over-the-counter or prescription medicines as directed by your caregiver. SEEK MEDICAL CARE IF:   Your weakness seems to be getting worse or spreads to other parts of your body.  You develop new aches or pains. SEEK IMMEDIATE MEDICAL CARE IF:   You cannot perform your normal daily activities, such as getting dressed and feeding yourself.  You cannot walk up and down stairs, or you feel exhausted when you do so.  You have shortness of breath or chest pain.  You have difficulty moving parts of your body.  You have weakness in only one area of the body or on only one side of the body.  You have a fever.  You have trouble speaking or swallowing.  You cannot control your bladder or bowel movements.  You have black or bloody vomit or stools. MAKE SURE YOU:  Understand these instructions.  Will watch your condition.  Will get help right away if you are not doing well or get worse. Document Released: 05/18/2005 Document Revised: 11/17/2011 Document Reviewed: 07/17/2011 [facility] Patient Information 2015 [place], [place]. This information is not intended to replace advice given to you by your  health care provider. Make sure you discuss any questions you have with your health care provider. ° °

## 2014-04-23 NOTE — ED Notes (Signed)
Patient is able to ambulate without any problems.

## 2016-01-11 ENCOUNTER — Emergency Department (HOSPITAL_BASED_OUTPATIENT_CLINIC_OR_DEPARTMENT_OTHER)
Admission: EM | Admit: 2016-01-11 | Discharge: 2016-01-11 | Disposition: A | Discharge status: Home / Self Care | Payer: Medicare Other | Attending: Emergency Medicine | Admitting: Emergency Medicine

## 2016-01-11 ENCOUNTER — Encounter (HOSPITAL_BASED_OUTPATIENT_CLINIC_OR_DEPARTMENT_OTHER): Payer: Self-pay | Admitting: *Deleted

## 2016-01-11 DIAGNOSIS — Z79899 Other long term (current) drug therapy: Secondary | ICD-10-CM | POA: Diagnosis not present

## 2016-01-11 DIAGNOSIS — Z7982 Long term (current) use of aspirin: Secondary | ICD-10-CM | POA: Insufficient documentation

## 2016-01-11 DIAGNOSIS — L237 Allergic contact dermatitis due to plants, except food: Secondary | ICD-10-CM

## 2016-01-11 DIAGNOSIS — Z87891 Personal history of nicotine dependence: Secondary | ICD-10-CM | POA: Diagnosis not present

## 2016-01-11 DIAGNOSIS — I10 Essential (primary) hypertension: Secondary | ICD-10-CM | POA: Insufficient documentation

## 2016-01-11 MED ORDER — HYDROXYZINE HCL 25 MG PO TABS
25.0000 mg | ORAL_TABLET | Freq: Once | ORAL | Status: AC
  Administered 2016-01-11: 25 mg via ORAL
  Filled 2016-01-11: qty 1

## 2016-01-11 MED ORDER — PREDNISONE 10 MG PO TABS: 20.0000 mg | ORAL_TABLET | Freq: Every day | ORAL | 0 refills | Status: DC

## 2016-01-11 MED ORDER — HYDROXYZINE HCL 10 MG PO TABS: 10.0000 mg | ORAL_TABLET | Freq: Four times a day (QID) | ORAL | 0 refills | Status: DC | PRN

## 2016-01-11 NOTE — ED Triage Notes (Signed)
Patient states he has a four day history of generalized body rash after exposure to poison ivy.
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
## 2016-01-11 NOTE — Discharge Instructions (Signed)
Take your medications as prescribed. You may continue using the calamine lotion to apply on affected areas. Follow-up with your family doctor within the next week for follow-up. Return to the emergency department if symptoms worsen or new onset of fever, chest pain, difficulty breathing, abdominal pain, vomiting, numbness, tingling, weakness, swelling, redness, warmth, drainage.

## 2016-01-11 NOTE — ED Notes (Signed)
PA at bedside, groin rash assessed in presence of this nurse.  Patient states that he got into some poison ivy or poison oak on Tuesday. Patient states it was on his gloves, and when he voided the rash onto his groin area. Patient also has rash to bilateral upper arms above the wrist, the top of his head and his eyes. Patient has been using calamine lotion and another unknown cream without relief. Denies weeping.

## 2016-01-27 ENCOUNTER — Observation Stay (HOSPITAL_COMMUNITY)
Admission: EM | Admit: 2016-01-27 | Discharge: 2016-01-28 | Disposition: A | Discharge status: Home / Self Care | Payer: Medicare Other | Attending: Oncology | Admitting: Oncology

## 2016-01-27 ENCOUNTER — Emergency Department (HOSPITAL_COMMUNITY): Payer: Medicare Other

## 2016-01-27 ENCOUNTER — Encounter (HOSPITAL_COMMUNITY): Payer: Self-pay | Admitting: Emergency Medicine

## 2016-01-27 DIAGNOSIS — Z79899 Other long term (current) drug therapy: Secondary | ICD-10-CM | POA: Insufficient documentation

## 2016-01-27 DIAGNOSIS — I10 Essential (primary) hypertension: Secondary | ICD-10-CM | POA: Diagnosis present

## 2016-01-27 DIAGNOSIS — Z87891 Personal history of nicotine dependence: Secondary | ICD-10-CM | POA: Diagnosis not present

## 2016-01-27 DIAGNOSIS — I2510 Atherosclerotic heart disease of native coronary artery without angina pectoris: Secondary | ICD-10-CM

## 2016-01-27 DIAGNOSIS — M79602 Pain in left arm: Secondary | ICD-10-CM | POA: Diagnosis not present

## 2016-01-27 DIAGNOSIS — Z7982 Long term (current) use of aspirin: Secondary | ICD-10-CM

## 2016-01-27 DIAGNOSIS — Z96653 Presence of artificial knee joint, bilateral: Secondary | ICD-10-CM | POA: Diagnosis not present

## 2016-01-27 DIAGNOSIS — L247 Irritant contact dermatitis due to plants, except food: Secondary | ICD-10-CM | POA: Diagnosis not present

## 2016-01-27 DIAGNOSIS — R42 Dizziness and giddiness: Secondary | ICD-10-CM | POA: Insufficient documentation

## 2016-01-27 DIAGNOSIS — R079 Chest pain, unspecified: Secondary | ICD-10-CM

## 2016-01-27 DIAGNOSIS — R946 Abnormal results of thyroid function studies: Secondary | ICD-10-CM | POA: Diagnosis not present

## 2016-01-27 DIAGNOSIS — M542 Cervicalgia: Secondary | ICD-10-CM

## 2016-01-27 DIAGNOSIS — R52 Pain, unspecified: Secondary | ICD-10-CM | POA: Diagnosis not present

## 2016-01-27 LAB — HEPATIC FUNCTION PANEL
ALBUMIN: 3.8 g/dL (ref 3.5–5.0)
ALK PHOS: 70 U/L (ref 38–126)
ALT: 20 U/L (ref 17–63)
AST: 25 U/L (ref 15–41)
Bilirubin, Direct: <0.1 mg/dL — ABNORMAL LOW (ref 0.1–0.5)
TOTAL PROTEIN: 6.4 g/dL — AB (ref 6.5–8.1)
Total Bilirubin: 0.4 mg/dL (ref 0.3–1.2)

## 2016-01-27 LAB — BASIC METABOLIC PANEL
ANION GAP: 8 (ref 5–15)
BUN: 15 mg/dL (ref 6–20)
CHLORIDE: 105 mmol/L (ref 101–111)
CO2: 23 mmol/L (ref 22–32)
Calcium: 9.3 mg/dL (ref 8.9–10.3)
Creatinine, Ser: 1.11 mg/dL (ref 0.61–1.24)
GFR calc non Af Amer: >60 mL/min (ref >60)
GLUCOSE: 89 mg/dL (ref 65–99)
POTASSIUM: 3.8 mmol/L (ref 3.5–5.1)
Sodium: 136 mmol/L (ref 135–145)

## 2016-01-27 LAB — CBC
HEMATOCRIT: 41.8 % (ref 39.0–52.0)
Hemoglobin: 14.3 g/dL (ref 13.0–17.0)
MCH: 32.2 pg (ref 26.0–34.0)
MCHC: 34.2 g/dL (ref 30.0–36.0)
MCV: 94.1 fL (ref 78.0–100.0)
Platelets: 187 10*3/uL (ref 150–400)
RBC: 4.44 MIL/uL (ref 4.22–5.81)
RDW: 12.8 % (ref 11.5–15.5)
WBC: 7.5 10*3/uL (ref 4.0–10.5)

## 2016-01-27 LAB — TSH: TSH: 7.083 u[IU]/mL — AB (ref 0.350–4.500)

## 2016-01-27 LAB — I-STAT TROPONIN, ED: Troponin i, poc: 0 ng/mL (ref 0.00–0.08)

## 2016-01-27 LAB — TROPONIN I: Troponin I: <0.03 ng/mL (ref <0.03)

## 2016-01-27 MED ORDER — ROSUVASTATIN CALCIUM 10 MG PO TABS
10.0000 mg | ORAL_TABLET | Freq: Every day | ORAL | Status: DC
  Administered 2016-01-27 – 2016-01-28 (×2): 10 mg via ORAL
  Filled 2016-01-27 (×2): qty 1

## 2016-01-27 MED ORDER — BUSPIRONE HCL 5 MG PO TABS
15.0000 mg | ORAL_TABLET | Freq: Two times a day (BID) | ORAL | Status: DC
  Administered 2016-01-27 – 2016-01-28 (×2): 15 mg via ORAL
  Filled 2016-01-27 (×2): qty 1

## 2016-01-27 MED ORDER — ENOXAPARIN SODIUM 40 MG/0.4ML ~~LOC~~ SOLN
40.0000 mg | SUBCUTANEOUS | Status: DC
  Filled 2016-01-27: qty 0.4

## 2016-01-27 MED ORDER — GI COCKTAIL ~~LOC~~
30.0000 mL | Freq: Four times a day (QID) | ORAL | Status: DC | PRN
Start: 2016-01-27 — End: 2016-01-28

## 2016-01-27 MED ORDER — PANTOPRAZOLE SODIUM 40 MG PO TBEC
40.0000 mg | DELAYED_RELEASE_TABLET | Freq: Every day | ORAL | Status: DC
  Administered 2016-01-27 – 2016-01-28 (×2): 40 mg via ORAL
  Filled 2016-01-27 (×2): qty 1

## 2016-01-27 MED ORDER — ASPIRIN EC 81 MG PO TBEC
81.0000 mg | DELAYED_RELEASE_TABLET | Freq: Every day | ORAL | Status: DC
  Administered 2016-01-28: 81 mg via ORAL
  Filled 2016-01-27: qty 1

## 2016-01-27 MED ORDER — ONDANSETRON HCL 4 MG/2ML IJ SOLN: 4.0000 mg | Freq: Four times a day (QID) | INTRAMUSCULAR | Status: DC | PRN

## 2016-01-27 MED ORDER — BUPROPION HCL ER (XL) 150 MG PO TB24
300.0000 mg | ORAL_TABLET | Freq: Every day | ORAL | Status: DC
  Administered 2016-01-28: 300 mg via ORAL
  Filled 2016-01-27: qty 2

## 2016-01-27 MED ORDER — ACETAMINOPHEN 325 MG PO TABS: 650.0000 mg | ORAL_TABLET | ORAL | Status: DC | PRN

## 2016-01-27 MED ORDER — LISINOPRIL 2.5 MG PO TABS
2.5000 mg | ORAL_TABLET | Freq: Every day | ORAL | Status: DC
  Administered 2016-01-27: 2.5 mg via ORAL
  Filled 2016-01-27: qty 1
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
## 2016-01-28 ENCOUNTER — Observation Stay (HOSPITAL_COMMUNITY): Payer: Medicare Other

## 2016-01-28 ENCOUNTER — Observation Stay (HOSPITAL_BASED_OUTPATIENT_CLINIC_OR_DEPARTMENT_OTHER): Payer: Medicare Other

## 2016-01-28 DIAGNOSIS — M79602 Pain in left arm: Secondary | ICD-10-CM

## 2016-01-28 DIAGNOSIS — Z79899 Other long term (current) drug therapy: Secondary | ICD-10-CM | POA: Diagnosis not present

## 2016-01-28 DIAGNOSIS — R0789 Other chest pain: Secondary | ICD-10-CM

## 2016-01-28 DIAGNOSIS — R079 Chest pain, unspecified: Secondary | ICD-10-CM

## 2016-01-28 DIAGNOSIS — M542 Cervicalgia: Secondary | ICD-10-CM | POA: Diagnosis not present

## 2016-01-28 DIAGNOSIS — I25118 Atherosclerotic heart disease of native coronary artery with other forms of angina pectoris: Secondary | ICD-10-CM | POA: Diagnosis not present

## 2016-01-28 DIAGNOSIS — R42 Dizziness and giddiness: Secondary | ICD-10-CM | POA: Diagnosis not present

## 2016-01-28 DIAGNOSIS — R946 Abnormal results of thyroid function studies: Secondary | ICD-10-CM

## 2016-01-28 DIAGNOSIS — Z87891 Personal history of nicotine dependence: Secondary | ICD-10-CM | POA: Diagnosis not present

## 2016-01-28 DIAGNOSIS — L247 Irritant contact dermatitis due to plants, except food: Secondary | ICD-10-CM | POA: Diagnosis not present

## 2016-01-28 DIAGNOSIS — Z7982 Long term (current) use of aspirin: Secondary | ICD-10-CM | POA: Diagnosis not present

## 2016-01-28 DIAGNOSIS — I10 Essential (primary) hypertension: Secondary | ICD-10-CM | POA: Diagnosis not present

## 2016-01-28 DIAGNOSIS — I2510 Atherosclerotic heart disease of native coronary artery without angina pectoris: Secondary | ICD-10-CM | POA: Diagnosis not present

## 2016-01-28 DIAGNOSIS — Z96653 Presence of artificial knee joint, bilateral: Secondary | ICD-10-CM | POA: Diagnosis not present

## 2016-01-28 LAB — NM MYOCAR MULTI W/SPECT W/WALL MOTION / EF
CHL CUP MPHR: 150 {beats}/min
CSEPED: 5 min
CSEPEW: 1 METS
CSEPPHR: 102 {beats}/min
Percent HR: 68 %
Rest HR: 64 {beats}/min

## 2016-01-28 LAB — TROPONIN I

## 2016-01-28 MED ORDER — TECHNETIUM TC 99M TETROFOSMIN IV KIT
10.0000 | PACK | Freq: Once | INTRAVENOUS | Status: AC | PRN
  Administered 2016-01-28: 10 via INTRAVENOUS

## 2016-01-28 MED ORDER — TECHNETIUM TC 99M TETROFOSMIN IV KIT
30.0000 | PACK | Freq: Once | INTRAVENOUS | Status: AC | PRN
  Administered 2016-01-28: 30 via INTRAVENOUS

## 2016-01-28 MED ORDER — CALAMINE EX LOTN
TOPICAL_LOTION | CUTANEOUS | Status: DC | PRN
  Administered 2016-01-28: 12:00:00 via TOPICAL
  Filled 2016-01-28: qty 118

## 2016-01-28 MED ORDER — REGADENOSON 0.4 MG/5ML IV SOLN
INTRAVENOUS | Status: AC
Start: 2016-01-28 — End: 2016-01-28
  Administered 2016-01-28: 0.4 mg via INTRAVENOUS
  Filled 2016-01-28: qty 5

## 2016-01-28 MED ORDER — REGADENOSON 0.4 MG/5ML IV SOLN
0.4000 mg | Freq: Once | INTRAVENOUS | Status: AC
  Administered 2016-01-28: 0.4 mg via INTRAVENOUS
  Filled 2016-01-28: qty 5

## 2016-01-28 NOTE — Progress Notes (Signed)
Dr. Katz has seen the patient. Will proceed with Exercise Myoview. Full note to follow later today.  

## 2016-01-28 NOTE — Progress Notes (Signed)
Subjective: Currently, the patient is comfortable w/o recurrence of arm pain. He does complain of itching in his groin 2/2 poison ivy exposure 2 weeks ago and requests lotion. Agreeable to stress test and cervical XR today with anticipated discharge this afternoon.  Interval Events: TSH elevated at 7.  Objective: Vital signs in last 24 hours: Vitals:   01/27/16 1600 01/27/16 1704 01/27/16 2159 01/28/16 0300  BP: 140/87 (!) 145/90 139/68 (!) 132/55  Pulse: 61 66 69 63  Resp: 19 20    Temp:  98 F (36.7 C) 98 F (36.7 C) 98 F (36.7 C)  TempSrc:  Oral Oral Oral  SpO2: 97% 99% 98% 98%  Weight:    191 lb 4.8 oz (86.8 kg)  Height:       Physical Exam: Physical Exam  Constitutional: He is oriented to person, place, and time. He appears well-developed and well-nourished. No distress.  Cardiovascular: Normal rate, regular rhythm, normal heart sounds and intact distal pulses.   Pulmonary/Chest: Effort normal and breath sounds normal. No respiratory distress.  Abdominal: Soft. Bowel sounds are normal. There is no tenderness.  Musculoskeletal: Normal range of motion. He exhibits no edema, tenderness or deformity.  Neurological: He is alert and oriented to person, place, and time.  Skin:  Scattered tattoos   Labs: CBC:  Recent Labs Lab 01/27/16 1418  WBC 7.5  HGB 14.3  HCT 41.8  MCV 94.1  PLT 187   Metabolic Panel:  Recent Labs Lab 01/27/16 1418 01/27/16 2242  NA 136  --   K 3.8  --   CL 105  --   CO2 23  --   GLUCOSE 89  --   BUN 15  --   CREATININE 1.11  --   CALCIUM 9.3  --   ALT  --  20  ALKPHOS  --  70  BILITOT  --  0.4  PROT  --  6.4*  ALBUMIN  --  3.8   Cardiac Labs:  Recent Labs Lab 01/27/16 1430 01/27/16 1637 01/27/16 2242 01/28/16 0347  TROPIPOC 0.00  --   --   --   TROPONINI  --  <0.03 <0.03 <0.03    Medications:   Scheduled Medications: . aspirin EC  81 mg Oral Daily  . buPROPion  300 mg Oral Daily  . busPIRone  15 mg Oral BID  .  enoxaparin (LOVENOX) injection  40 mg Subcutaneous Q24H  . lisinopril  2.5 mg Oral QHS  . pantoprazole  40 mg Oral Daily  . rosuvastatin  10 mg Oral Daily   PRN Medications: acetaminophen, gi cocktail, ondansetron (ZOFRAN) IV  Assessment/Plan: Pt is a 70 y.o. yo male with a PMHx of CAD, HTN/HLD who was admitted on 01/27/2016 with symptoms of left arm pain to r/o ACS.  1) Left Arm Pain: Likely MSK given negative EKG and trops x 3. Symptoms resolved. - cervical spine XRs  2) CAD: h/o angiographically significant disease on 2012 cath. Strong family hx. No h/o angina. - continue home asa - Cardiology recommends Stress today  3) HTN: BPs 130-150/70-90 since admission. Continue home meds lisinopril 2.5mg  qD.  4) Elevated TSH: TSH at 7, last check several years ago was 2. Will recommend outpatient follow up with repeat TSH testing as the patient currently denies any symptoms of hypothyroidism.  5) Contact Dermatitis: poison ivy exposure in the groin 2 weeks ago with itching and irritation. Just finished prednisone taper, will give Calamine lotion topically PRN.  DVT PPx -  low molecular weight heparin  Length of Stay: 0 day(s) Dispo: Anticipated discharge today is stress test is negative.  Carolynn Comment, MD Pager: [phone] (7AM-5PM) 01/28/2016, 6:44 AM
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
## 2016-01-28 NOTE — Discharge Summary (Signed)
Name: Luke Golden MRN: [id number] DOB: 01-26-1946 70 y.o. PCP: Janan Halter, MD  Date of Admission: 01/27/2016  1:46 PM Date of Discharge: 01/28/2016 Attending Physician: Levert Feinstein, MD  Discharge Diagnosis: 1. MSK left arm pain 2. Elevated TSH 3. Contact dermatits 2/2 poison ivy exposure  Principal Problem:   Left arm pain Active Problems:   Coronary artery disease   Essential hypertension   Discharge Medications:   Medication List    STOP taking these medications   enoxaparin 30 MG/0.3ML injection Commonly known as:  LOVENOX   hydrOXYzine 10 MG tablet Commonly known as:  ATARAX/VISTARIL   methocarbamol 500 MG tablet Commonly known as:  ROBAXIN   OVER THE COUNTER MEDICATION   oxyCODONE 5 MG immediate release tablet Commonly known as:  ROXICODONE   predniSONE 10 MG tablet Commonly known as:  DELTASONE     TAKE these medications   aspirin EC 325 MG tablet Take 1 tablet (325 mg total) by mouth daily. What changed:  Another medication with the same name was removed. Continue taking this medication, and follow the directions you see here.   bisacodyl 5 MG EC tablet Commonly known as:  DULCOLAX Take 1 tablet (5 mg total) by mouth daily as needed for moderate constipation.   buPROPion 300 MG 24 hr tablet Commonly known as:  WELLBUTRIN XL Take 300 mg by mouth daily.   busPIRone 10 MG tablet Commonly known as:  BUSPAR Take 15-30 mg by mouth See admin instructions. Take 15 mg by mouth in the morning and take 30 mg by mouth at bedtime   calamine lotion Apply 1 application topically as needed for itching.   docusate sodium 100 MG capsule Commonly known as:  COLACE Take 300 mg by mouth at bedtime.   lisinopril 2.5 MG tablet Commonly known as:  PRINIVIL,ZESTRIL Take 2.5 mg by mouth at bedtime.   omeprazole 20 MG capsule Commonly known as:  PRILOSEC Take 20 mg by mouth daily.   OVER THE COUNTER MEDICATION Take 1 capsule by mouth daily.  Lipozene   OVER THE COUNTER MEDICATION Take 2 tablets by mouth daily. Metaboup Plus   OVER THE COUNTER MEDICATION Take 1 capsule by mouth daily. Alpha Monster Advanced   rosuvastatin 20 MG tablet Commonly known as:  CRESTOR Take 10 mg by mouth daily.       Disposition and follow-up:   Luke Golden was discharged from [facility] in Good condition.  At the hospital follow up visit please address:  Left Arm Pain: Recurrence of symptoms? Difficulty with weakness or numbness? Consider PT for recurrence. TSH: Repeat TSH level. ROS for thyroid dysfunction. Poison Ivy: symptoms controlled? Consider 2nd steroid dose pack if no resolution.  Follow-up Appointments: Follow-up Information    Janan Halter, MD .   Specialty:  Hematology and Oncology Why:  Follow up with your doctor if needed. Contact information: [address] [place] [address] [phone]           Hospital Course by problem list: Principal Problem:   Left arm pain Active Problems:   Coronary artery disease   Essential hypertension   1. Left Arm Pain: Pt presented with a several hour history of left arm pain which progressed from the forearm to the left neck. The pain resolved by his presentation to the emergency department. His EKG was without signs of ischemia, his troponins were negative 3. He received a nuclear stress test while in the hospital which  demonstrated a defect in the mid inferolateral region, but the study was low risk. It was thought that his pain was most likely related to musculoskeletal issues secondary to potential cervical arthritis. He received cervical x-rays which demonstrated no significant misalignment or bony abnormalities. He was discharged with a plan for physical therapy if symptoms return and symptomatically treatment with NSAIDs.  2. Elevated TSH: Patient was found to have an elevated TSH of 7 on admission. Any symptoms specific to thyroid  dysfunction. TSH level several years ago was 2. We recommend continued workup in the outpatient setting as this value may be aberrant in the setting of acute illness.  3. Contact Dermatitis: On presentation patient was noted to have contact dermatitis of the groin area after exposure to poison ivy 2 weeks ago. He had just completed a prednisone but was still reporting significant discomfort and itching. He was treated with calamine lotion. We recommend continued follow-up in the outpatient setting if his symptoms do not resolve.  Discharge Vitals:   BP 120/66   Pulse 93   Temp 98 F (36.7 C) (Oral)   Resp 20   Ht 5\' 8"  (1.727 m)   Wt 191 lb 4.8 oz (86.8 kg)   SpO2 98%   BMI 29.09 kg/m   Pertinent Labs, Studies, and Procedures: See above.  Procedures Performed:  Dg Chest 2 View  Result Date: 01/27/2016 CLINICAL DATA:  Left arm and jaw pain. EXAM: CHEST  2 VIEW COMPARISON:  Radiographs of April 23, 2014. FINDINGS: The heart size and mediastinal contours are within normal limits. No pneumothorax or pleural effusion is noted. Atherosclerosis of thoracic aorta is noted. Both lungs are clear. Old right rib fractures are noted. IMPRESSION: Aortic atherosclerosis.  No acute cardiopulmonary abnormality seen. Electronically Signed   By: Lupita Raider, M.D.   On: 01/27/2016 14:44   Cardiac Stress Test:  There was no ST segment deviation noted during stress.  Defect 1: There is a defect present in the mid inferolateral location.  The left ventricular ejection fraction is normal (55-65%).  This is a low risk study.  Consultations: Treatment Team:  Luis Abed, MD - Cardiology  Discharge Instructions: Discharge Instructions    Call MD for:  difficulty breathing, headache or visual disturbances    Complete by:  As directed   Call MD for:  extreme fatigue    Complete by:  As directed   Call MD for:  persistant dizziness or light-headedness    Complete by:  As directed   Call MD  for:  severe uncontrolled pain    Complete by:  As directed   Diet - low sodium heart healthy    Complete by:  As directed   Discharge instructions    Complete by:  As directed   We believe that your arm pain was not related to your heart. We performed several blood tests, as well as tests of electrical activity of the heart which did not show any concern for damage to your heart. You also underwent a stress test for further evaluation and this test showed low risk for heart attack.  We believe that you're arm pain may be related to problems in the muscles and nerves of your neck. Did several x-rays of her neck in order to evaluate whether there might be entrapment of these nerves. These tests showed no problems with the alignment of your spine or vertebra. If you have further symptoms of arm pain we will recommend follow-up with  physical therapy for exercises that may improve your symptoms. We would also recommend medications such as ibuprofen or Motrin that she can get over-the-counter.  You can follow-up with your primary doctor as needed.   Increase activity slowly    Complete by:  As directed      Signed: Carolynn CommentBryan Tatelyn Vanhecke, MD 01/28/2016, 1:49 PM   Pager: [phone]

## 2016-01-28 NOTE — Care Management Note (Signed)
Case Management Note  Patient Details  Name: Liz Maladydwin A Stalvey MRN: [id number] Date of Birth: 13-Aug-1945  Subjective/Objective:   Arm pain, poison ivy, CAD, HTN                  Action/Plan: Discharge Planning: AVS reviewed: Chart reviewed. No needs identified.   PCP- Janan HalterSHINN, LOWELL C  MD  Expected Discharge Date:  01/28/2016             Expected Discharge Plan:  Home/Self Care  In-House Referral:  NA  Discharge planning Services  CM Consult  Post Acute Care Choice:  NA Choice offered to:  NA  DME Arranged:  N/A DME Agency:  NA  HH Arranged:  NA HH Agency:  NA  Status of Service:  Completed, signed off  If discussed at Long Length of Stay Meetings, dates discussed:    Additional Comments:  Elliot CousinShavis, Darlean Warmoth Ellen, RN 01/28/2016, 2:19 PM

## 2016-01-29 LAB — HEMOGLOBIN A1C
HEMOGLOBIN A1C: 5.3 % (ref 4.8–5.6)
MEAN PLASMA GLUCOSE: 105 mg/dL

## 2016-11-27 ENCOUNTER — Emergency Department (HOSPITAL_COMMUNITY): Payer: Non-veteran care

## 2016-11-27 ENCOUNTER — Encounter (HOSPITAL_COMMUNITY): Payer: Self-pay

## 2016-11-27 ENCOUNTER — Emergency Department (HOSPITAL_COMMUNITY)
Admission: EM | Admit: 2016-11-27 | Discharge: 2016-11-27 | Disposition: A | Discharge status: Home / Self Care | Payer: Non-veteran care | Attending: Emergency Medicine | Admitting: Emergency Medicine

## 2016-11-27 DIAGNOSIS — Z7982 Long term (current) use of aspirin: Secondary | ICD-10-CM | POA: Diagnosis not present

## 2016-11-27 DIAGNOSIS — Y999 Unspecified external cause status: Secondary | ICD-10-CM | POA: Diagnosis not present

## 2016-11-27 DIAGNOSIS — Z87891 Personal history of nicotine dependence: Secondary | ICD-10-CM | POA: Insufficient documentation

## 2016-11-27 DIAGNOSIS — Z96653 Presence of artificial knee joint, bilateral: Secondary | ICD-10-CM | POA: Insufficient documentation

## 2016-11-27 DIAGNOSIS — Z79899 Other long term (current) drug therapy: Secondary | ICD-10-CM | POA: Insufficient documentation

## 2016-11-27 DIAGNOSIS — W1830XA Fall on same level, unspecified, initial encounter: Secondary | ICD-10-CM | POA: Diagnosis not present

## 2016-11-27 DIAGNOSIS — S3992XA Unspecified injury of lower back, initial encounter: Secondary | ICD-10-CM | POA: Diagnosis present

## 2016-11-27 DIAGNOSIS — S32009A Unspecified fracture of unspecified lumbar vertebra, initial encounter for closed fracture: Secondary | ICD-10-CM

## 2016-11-27 DIAGNOSIS — Y939 Activity, unspecified: Secondary | ICD-10-CM | POA: Insufficient documentation

## 2016-11-27 DIAGNOSIS — S32019A Unspecified fracture of first lumbar vertebra, initial encounter for closed fracture: Secondary | ICD-10-CM | POA: Diagnosis not present

## 2016-11-27 DIAGNOSIS — S32029A Unspecified fracture of second lumbar vertebra, initial encounter for closed fracture: Secondary | ICD-10-CM | POA: Diagnosis not present

## 2016-11-27 DIAGNOSIS — S32008A Other fracture of unspecified lumbar vertebra, initial encounter for closed fracture: Secondary | ICD-10-CM | POA: Insufficient documentation

## 2016-11-27 DIAGNOSIS — I10 Essential (primary) hypertension: Secondary | ICD-10-CM | POA: Insufficient documentation

## 2016-11-27 DIAGNOSIS — Y929 Unspecified place or not applicable: Secondary | ICD-10-CM | POA: Insufficient documentation

## 2016-11-27 DIAGNOSIS — I2510 Atherosclerotic heart disease of native coronary artery without angina pectoris: Secondary | ICD-10-CM | POA: Insufficient documentation

## 2016-11-27 DIAGNOSIS — S32039A Unspecified fracture of third lumbar vertebra, initial encounter for closed fracture: Secondary | ICD-10-CM | POA: Diagnosis not present

## 2016-11-27 MED ORDER — LIDOCAINE 5 % EX PTCH
1.0000 | MEDICATED_PATCH | CUTANEOUS | Status: DC
  Filled 2016-11-27: qty 1

## 2016-11-27 MED ORDER — LIDOCAINE 5 % EX PTCH: 1.0000 | MEDICATED_PATCH | CUTANEOUS | 0 refills | Status: DC

## 2016-11-27 NOTE — ED Notes (Addendum)
Sitting on bed reading a book. Instructed to get undressed.

## 2016-11-27 NOTE — ED Notes (Signed)
Up to bathroom. Ambulates without difficulty.

## 2016-11-27 NOTE — ED Triage Notes (Signed)
Pt presents for evaluation of R lower back pain. Reports hx of vertigo due to sinuses and last Thursday fell back onto a dresser. States has been ambulating well, pain 1/10 with rest, worse with movement/exertion. Pt denies neurological symptoms. Pt AxO x4.
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
## 2016-11-27 NOTE — ED Notes (Signed)
Patient transported to CT 

## 2016-11-27 NOTE — Discharge Instructions (Signed)
You have suffered broken bones in your lower back (transverse process fractures in L1-L3).  These are stable fractures and will heal over time.  Apply lidoderm patched to the area where you have the most pain as needed.  Follow up with neurosurgeon as needed.  Return if you have any concerns.

## 2017-12-21 IMAGING — CT CT L SPINE W/O CM
3 series · 9 of 33 positions shown, 11 images · non-contrast
Comparison: PA and lateral chest radiographs 01/27/2016

CLINICAL DATA: 71-year-old male status post fall 8 days ago with
lumbar back pain.

EXAM:
CT LUMBAR SPINE WITHOUT CONTRAST
TECHNIQUE: Multidetector CT imaging of the lumbar spine was performed without
intravenous contrast administration. Multiplanar CT image
reconstructions were also generated.

[Series 4: l-spine 2.0 st · axial · 0.31mm/px · z∈[+1033,+1033]mm · 1 of 156 slices shown, 2 images]
[im 84/156  soft-tissue]
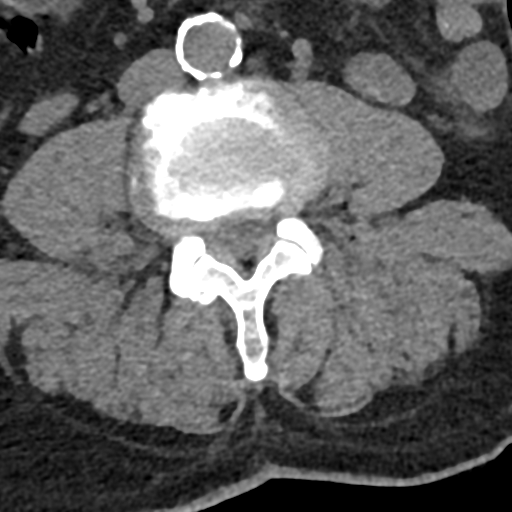
[im 84/156  bone]
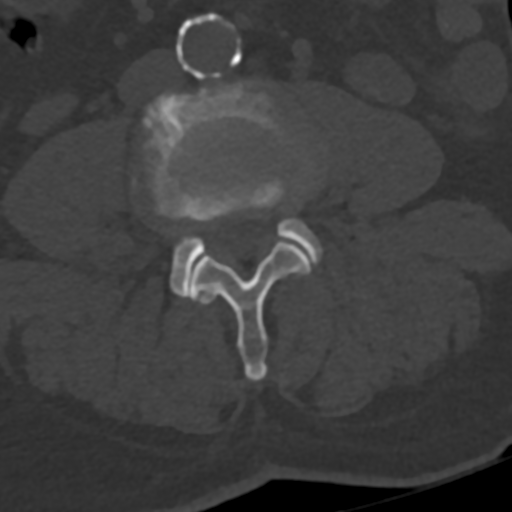

[Series 6: l-spine 2.0 cor bone · coronal · 0.32mm/px · 3 of 73 slices shown]
[im 15/73  bone]
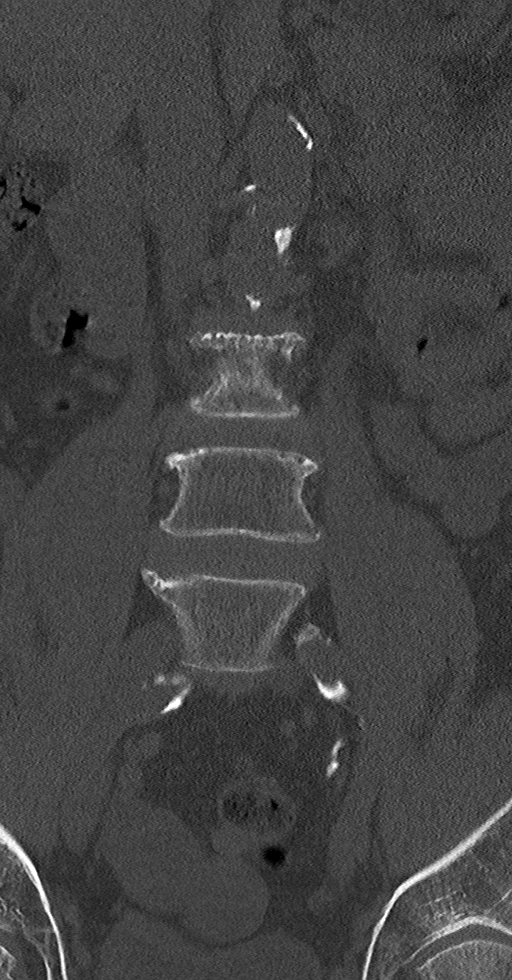
[im 29/73  bone]
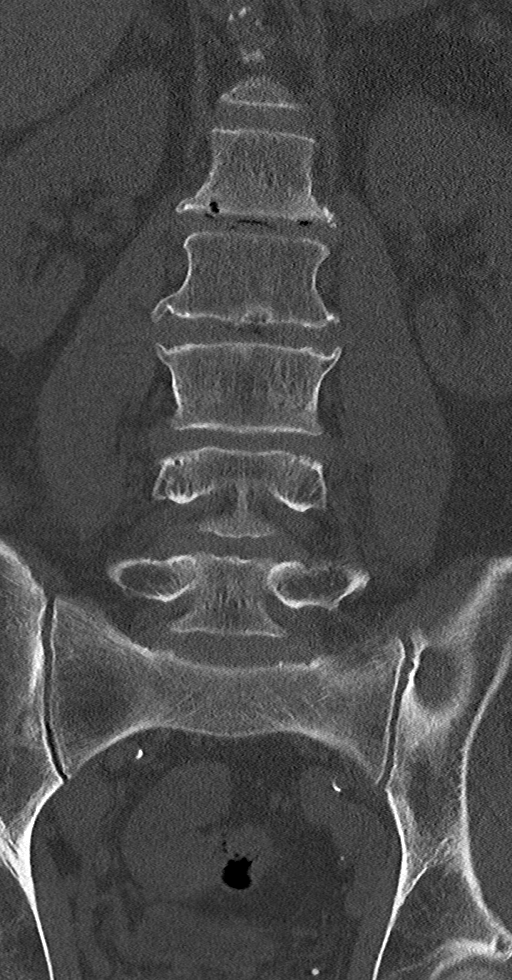
[im 44/73  bone]
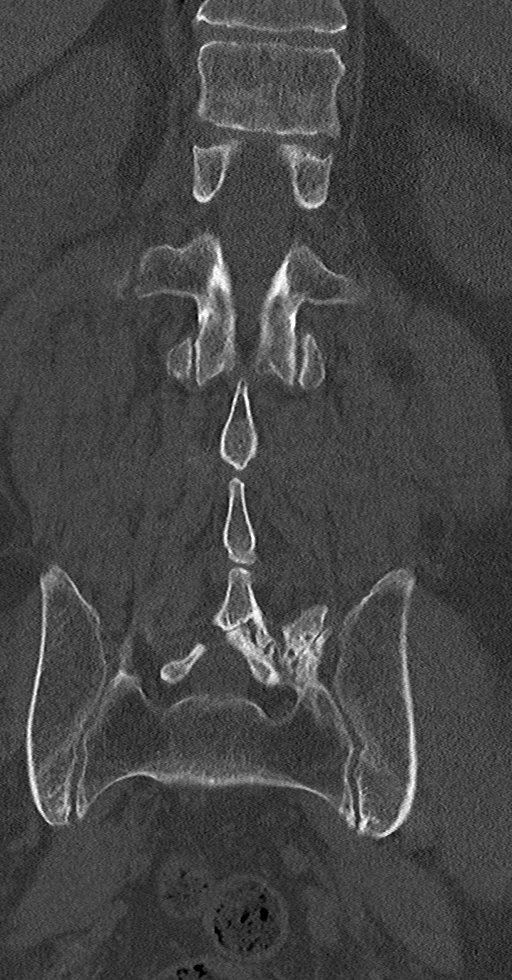

[Series 7: l-spine 2.0 sag bone · sagittal · 0.31mm/px · 5 of 61 slices shown, 6 images]
[im 21/61  bone]
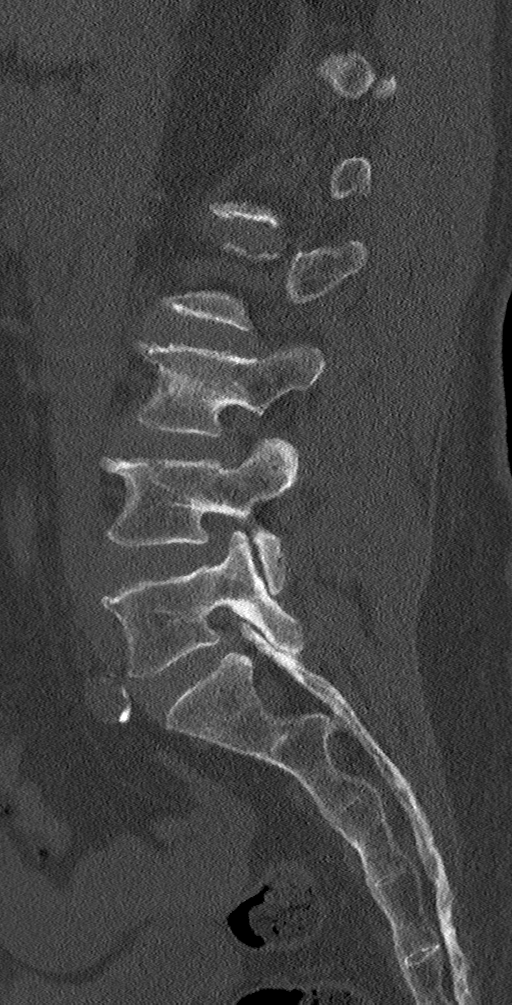
[im 26/61  bone]
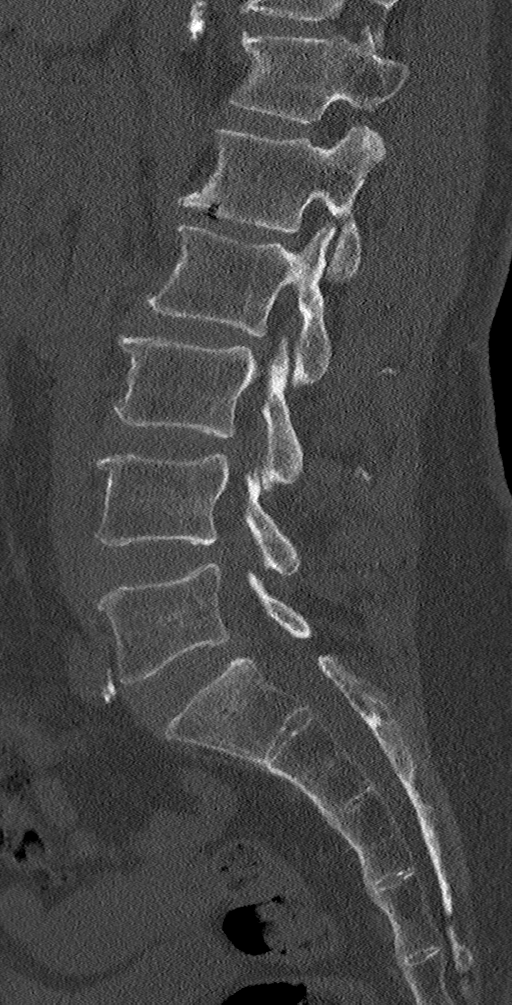
[im 31/61  soft-tissue]
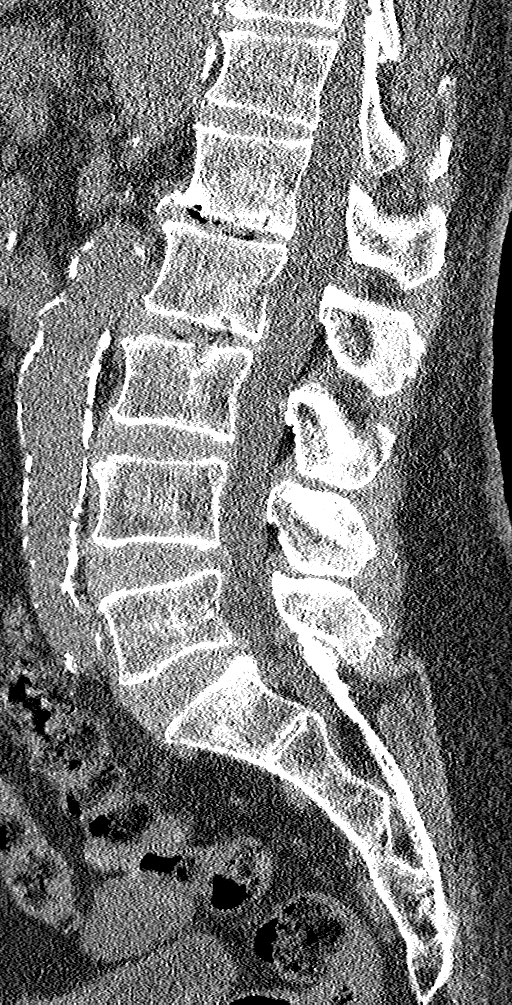
[im 31/61  bone]
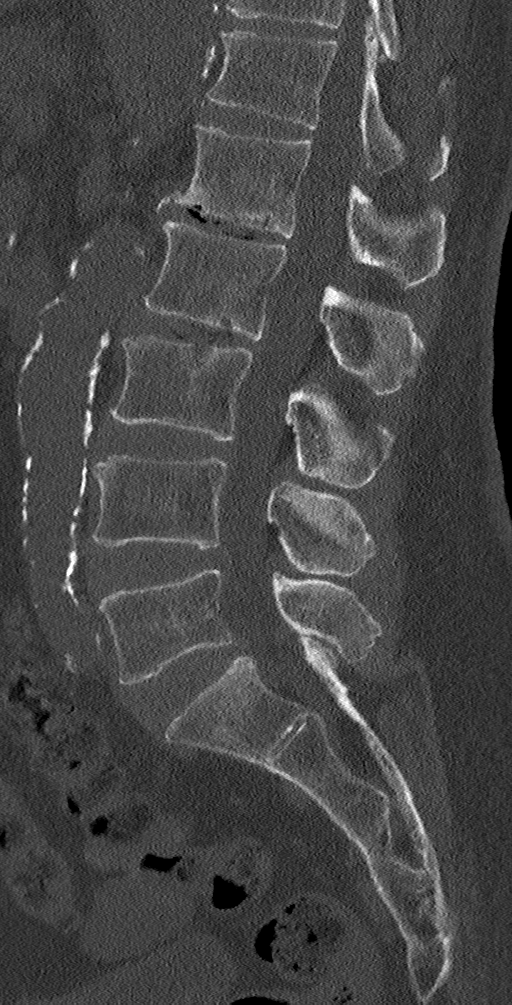
[im 36/61  bone]
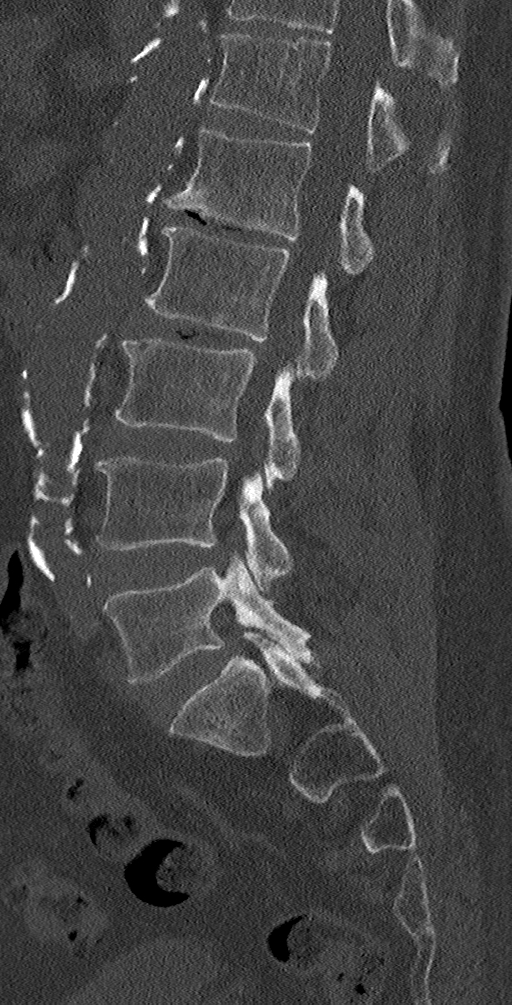
[im 41/61  bone]
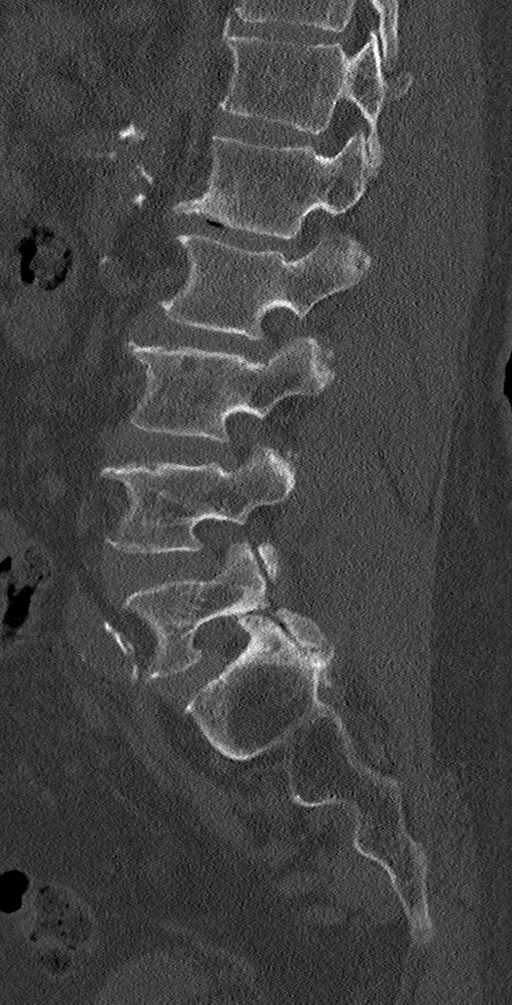

[9 of 33 positions shown; findings below may reference images not displayed]

FINDINGS: Segmentation: Normal.

Alignment: Mild straightening of lumbar lordosis. Otherwise
preserved lumbar vertebral height and alignment.

Vertebrae: Minimally to mildly displaced fractures of the right L1,
L2 and L3 transverse processes. The L4 and L5 levels appear intact.
The SI joints in the visible sacrum appear intact. The other visible
pelvis structures appear grossly intact. The visible lower thoracic
levels appear intact.

Paraspinal and other soft tissues: Extensive Calcified aortic
atherosclerosis. Ectatic infrarenal abdominal aorta, 26 mm diameter.

Other visible noncontrast abdominal viscera are negative. There is
diverticulosis in the distal large bowel. Negative visualized
posterior paraspinal soft tissues.

Disc levels:

L1-L2 and L2-L3: Chronic disc degeneration with vacuum disc and
endplate spurring.

L3-L4: Mild spinal stenosis and probable right rib lateral recess
stenosis (descending right L4 nerve root level) related to disc and
posterior element degeneration (series 4, image 73).

L4-L5: Mild to moderate degenerative spinal stenosis and right
greater than left lateral recess stenosis (descending right L5 nerve
root level) related to disc and posterior element degeneration.

L5-S1:  Severe facet degeneration on the left with vacuum facet.
IMPRESSION: 1. Minimal to mildly displaced fractures of the right L1 through L3
transverse processes. These are stable fractures.
2. Ectatic abdominal aorta at risk for aneurysm development.
Calcified aortic atherosclerosis. Recommend followup by Ultrasound
in 5 years. This recommendation follows ACR consensus guidelines:
White Paper of the ACR Incidental Findings Committee II on Vascular
Findings. [HOSPITAL] 5671; [DATE].
3. Degenerative spinal stenosis at L3-L4 and L4-L5 with probable
right lateral recess stenosis. Query right side radiculopathy.
Severe facet degeneration on the left at L5-S1.

## 2018-01-16 ENCOUNTER — Emergency Department (HOSPITAL_COMMUNITY): Payer: Non-veteran care

## 2018-01-16 ENCOUNTER — Other Ambulatory Visit: Payer: Self-pay

## 2018-01-16 ENCOUNTER — Encounter (HOSPITAL_COMMUNITY): Payer: Self-pay | Admitting: *Deleted

## 2018-01-16 ENCOUNTER — Emergency Department (HOSPITAL_COMMUNITY)
Admission: EM | Admit: 2018-01-16 | Discharge: 2018-01-16 | Disposition: A | Discharge status: Home / Self Care | Payer: Non-veteran care | Attending: Emergency Medicine | Admitting: Emergency Medicine

## 2018-01-16 DIAGNOSIS — Z7982 Long term (current) use of aspirin: Secondary | ICD-10-CM | POA: Insufficient documentation

## 2018-01-16 DIAGNOSIS — M549 Dorsalgia, unspecified: Secondary | ICD-10-CM | POA: Diagnosis not present

## 2018-01-16 DIAGNOSIS — Z79899 Other long term (current) drug therapy: Secondary | ICD-10-CM | POA: Diagnosis not present

## 2018-01-16 DIAGNOSIS — I10 Essential (primary) hypertension: Secondary | ICD-10-CM | POA: Diagnosis not present

## 2018-01-16 DIAGNOSIS — M5489 Other dorsalgia: Secondary | ICD-10-CM | POA: Diagnosis not present

## 2018-01-16 DIAGNOSIS — Z87891 Personal history of nicotine dependence: Secondary | ICD-10-CM | POA: Insufficient documentation

## 2018-01-16 DIAGNOSIS — R079 Chest pain, unspecified: Secondary | ICD-10-CM | POA: Insufficient documentation

## 2018-01-16 DIAGNOSIS — Z96653 Presence of artificial knee joint, bilateral: Secondary | ICD-10-CM | POA: Insufficient documentation

## 2018-01-16 DIAGNOSIS — R9431 Abnormal electrocardiogram [ECG] [EKG]: Secondary | ICD-10-CM | POA: Diagnosis not present

## 2018-01-16 DIAGNOSIS — R0789 Other chest pain: Secondary | ICD-10-CM | POA: Diagnosis not present

## 2018-01-16 DIAGNOSIS — R0602 Shortness of breath: Secondary | ICD-10-CM | POA: Diagnosis not present

## 2018-01-16 LAB — CBC
HEMATOCRIT: 41.4 % (ref 39.0–52.0)
Hemoglobin: 14 g/dL (ref 13.0–17.0)
MCH: 32.3 pg (ref 26.0–34.0)
MCHC: 33.8 g/dL (ref 30.0–36.0)
MCV: 95.4 fL (ref 78.0–100.0)
Platelets: 169 10*3/uL (ref 150–400)
RBC: 4.34 MIL/uL (ref 4.22–5.81)
RDW: 12.5 % (ref 11.5–15.5)
WBC: 5.2 10*3/uL (ref 4.0–10.5)

## 2018-01-16 LAB — I-STAT TROPONIN, ED
Troponin i, poc: 0 ng/mL (ref 0.00–0.08)
Troponin i, poc: 0.01 ng/mL (ref 0.00–0.08)

## 2018-01-16 LAB — BASIC METABOLIC PANEL
Anion gap: 6 (ref 5–15)
BUN: 16 mg/dL (ref 8–23)
CALCIUM: 9.1 mg/dL (ref 8.9–10.3)
CO2: 25 mmol/L (ref 22–32)
Chloride: 106 mmol/L (ref 98–111)
Creatinine, Ser: 1.09 mg/dL (ref 0.61–1.24)
GFR calc Af Amer: >60 mL/min (ref >60)
GLUCOSE: 97 mg/dL (ref 70–99)
POTASSIUM: 4.2 mmol/L (ref 3.5–5.1)
Sodium: 137 mmol/L (ref 135–145)
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
## 2018-01-16 NOTE — ED Notes (Signed)
Patient transported to X-ray 

## 2018-01-16 NOTE — ED Triage Notes (Signed)
Pt reports CP woke him up this morning. CP that radiated to LT shoulder and mid back. Pain score 8/10  . Pain stopped with out treatment to 0/10. Ems gave 324 ASA.

## 2018-01-16 NOTE — Discharge Instructions (Addendum)
Your evaluated in the emergency department for left-sided chest pain.  You had an EKG chest x-ray and blood work that did not show an obvious cause of your pain.  It will be important for you to follow-up with your doctors and your cardiologist.  Please continue your regular medicine and return if any worsening symptoms.
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
## 2018-01-16 NOTE — ED Notes (Signed)
D/c reviewed with patient 

## 2018-04-14 DIAGNOSIS — Z0000 Encounter for general adult medical examination without abnormal findings: Secondary | ICD-10-CM | POA: Diagnosis not present

## 2018-06-13 DIAGNOSIS — Z0000 Encounter for general adult medical examination without abnormal findings: Secondary | ICD-10-CM | POA: Diagnosis not present

## 2019-02-09 IMAGING — CR DG CHEST 2V
2 series · 2 of 2 positions shown · non-contrast
Comparison: 01/27/2016

CLINICAL DATA: Chest pain. Hypertension. History of pneumonia and
pneumothorax.

EXAM:
CHEST - 2 VIEW

[chest pa]
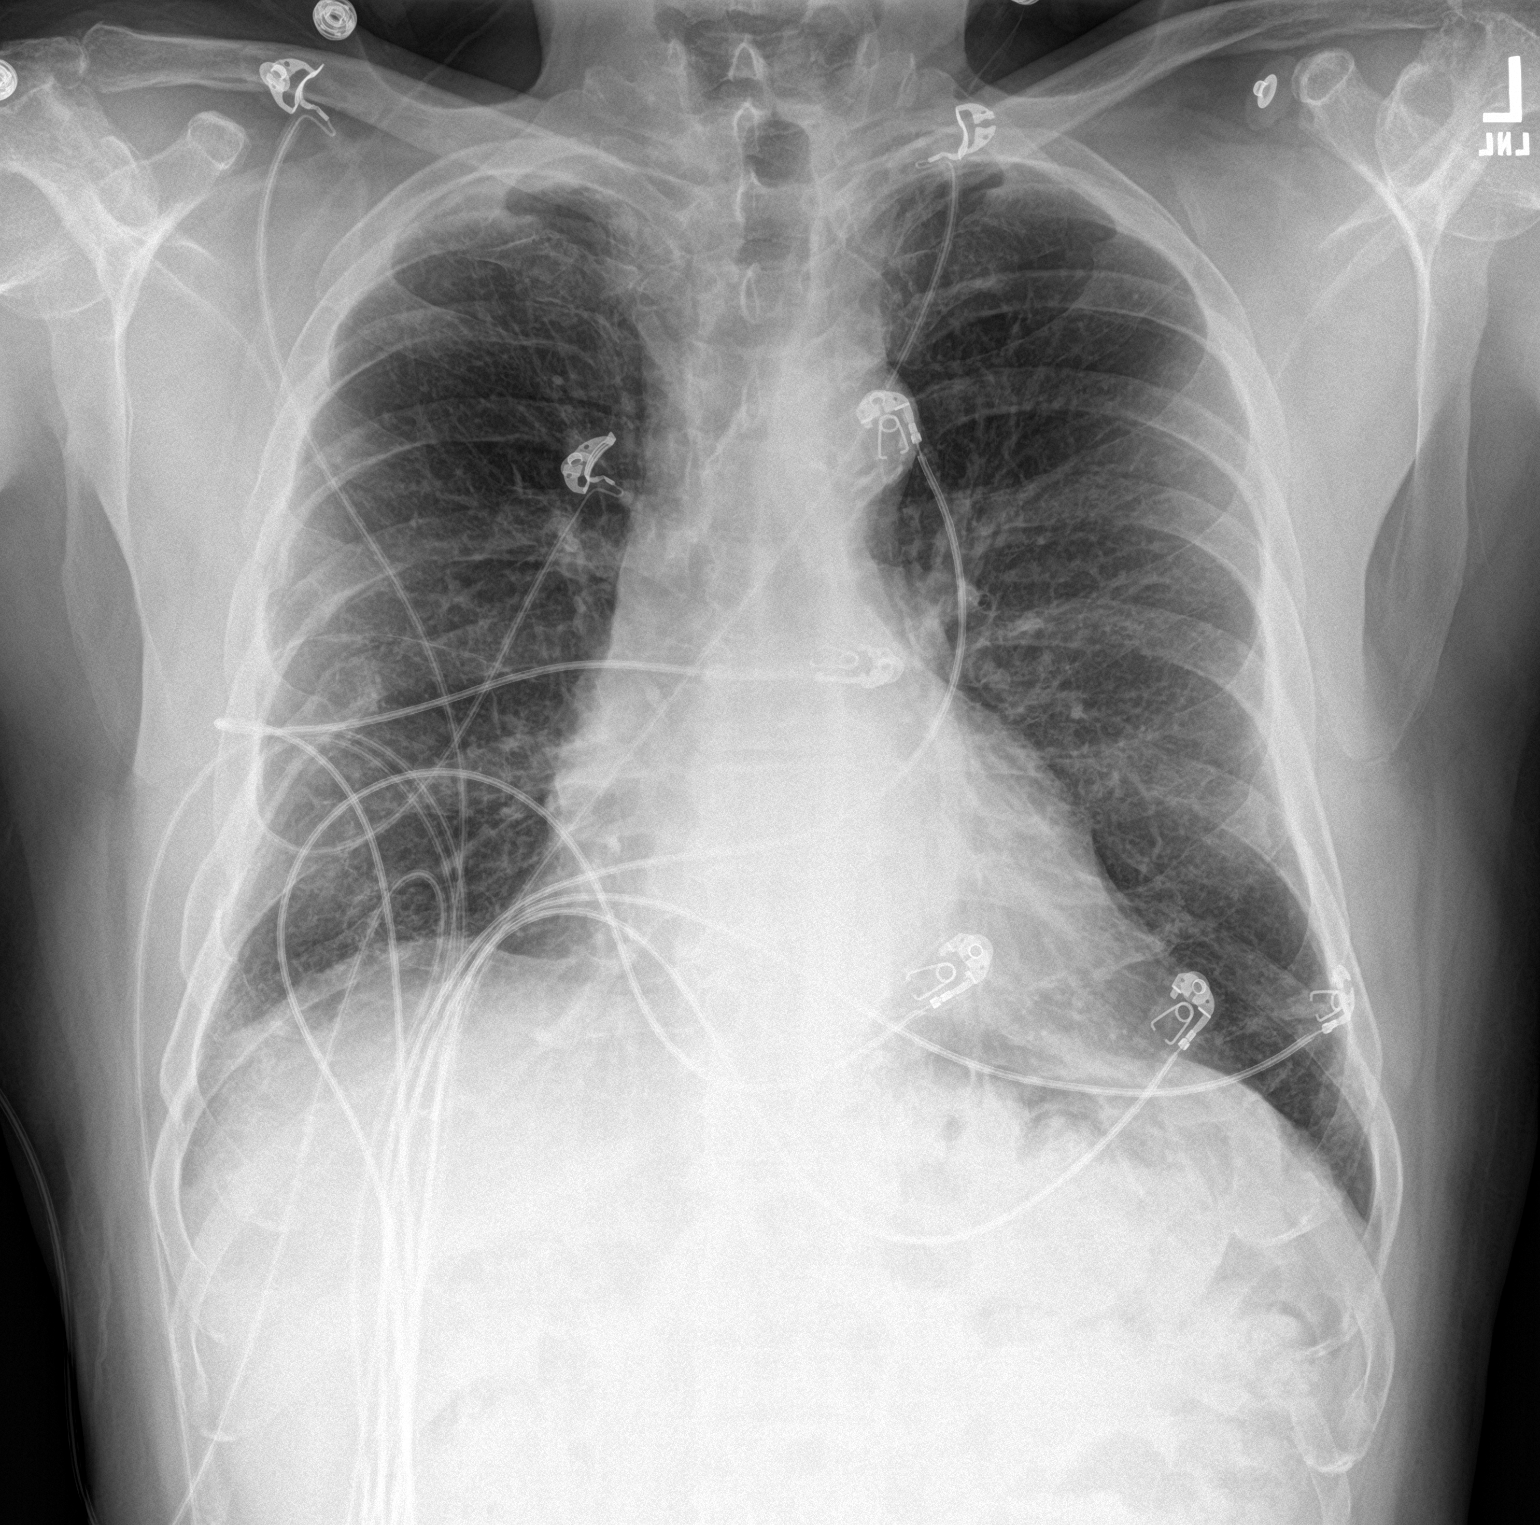

[chest lat]
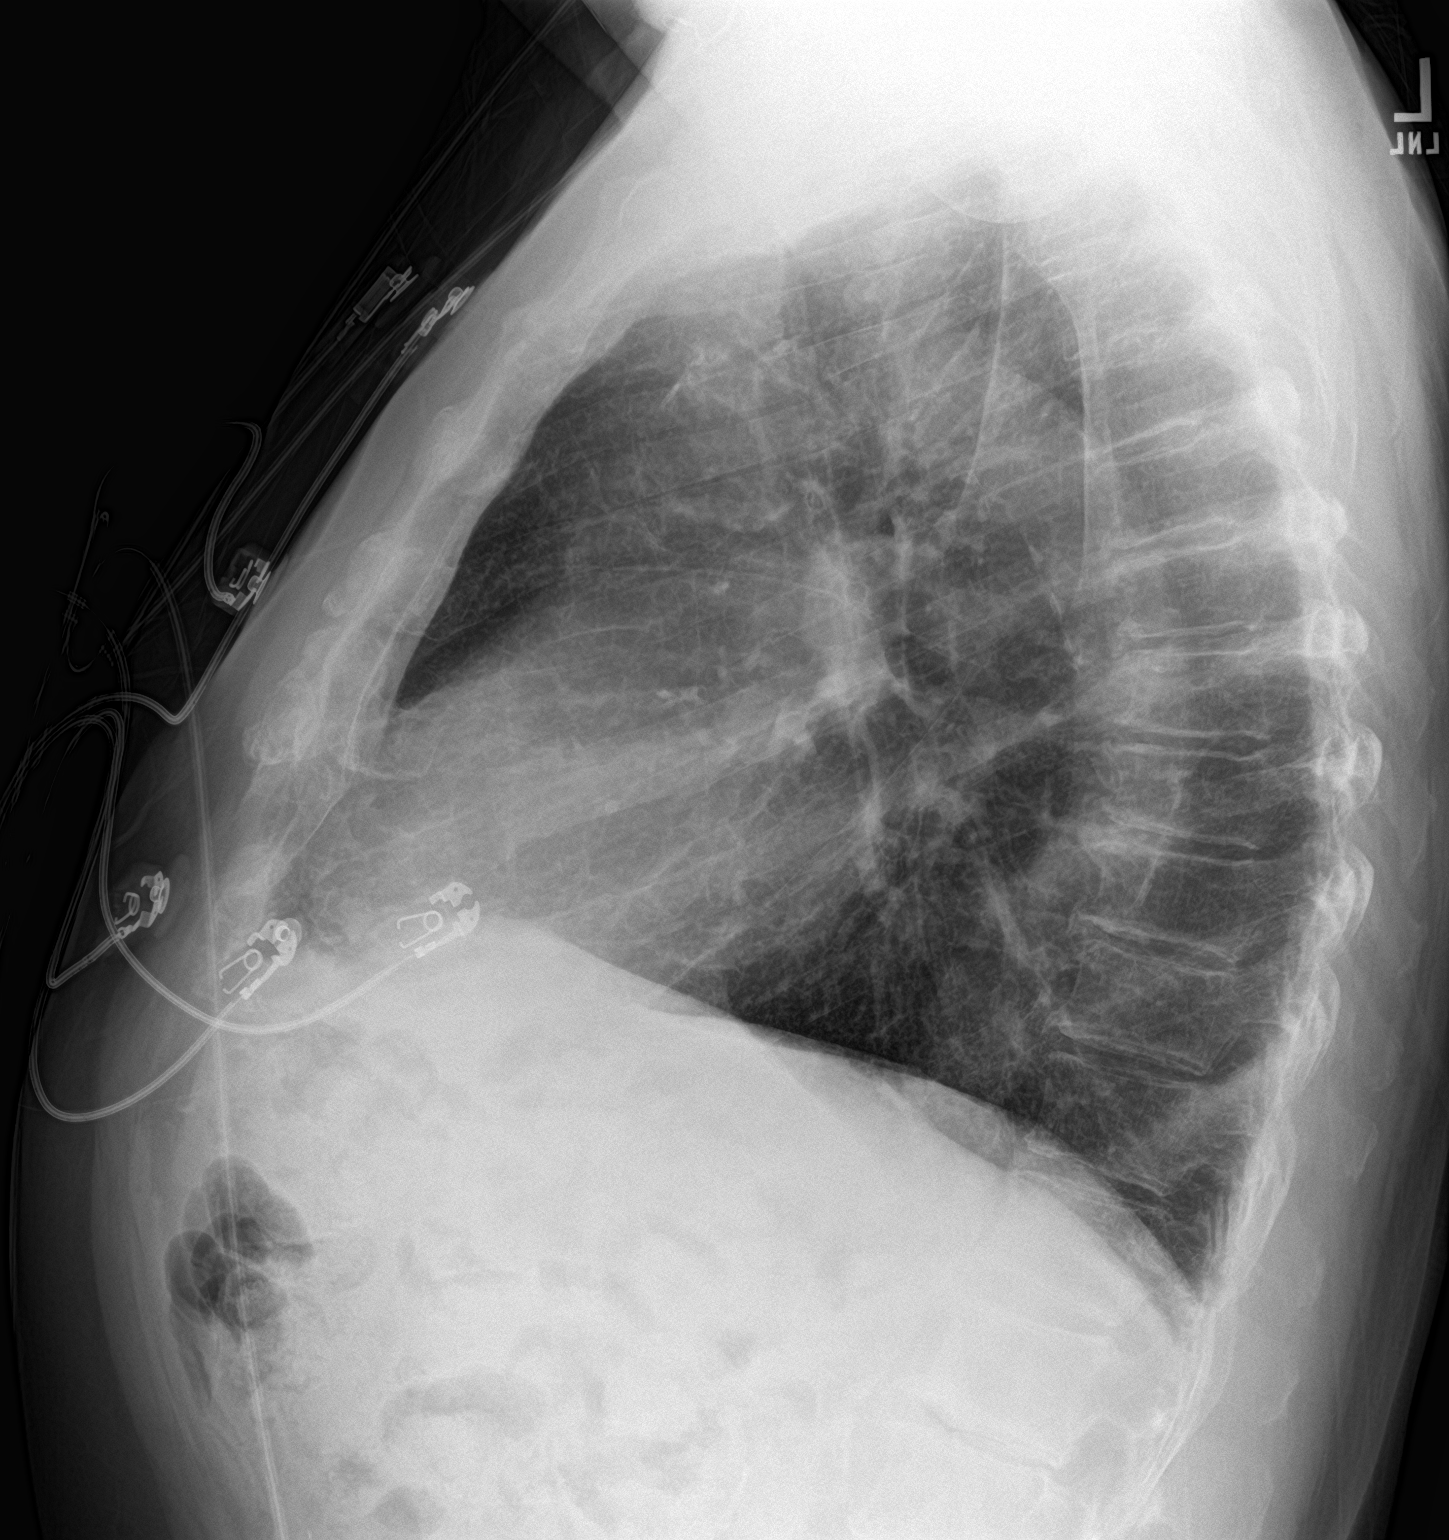

[2 of 2 positions shown; findings below may reference images not displayed]

FINDINGS: Hyperinflation. Numerous leads and wires project over the chest.
Mild right hemidiaphragm elevation. Midline trachea. Normal heart
size and mediastinal contours. Atherosclerosis in the transverse
aorta. No pleural effusion or pneumothorax. Diffuse peribronchial
thickening. Clear lungs. Remote right rib fractures.
IMPRESSION: Hyperinflation and interstitial thickening, consistent with
COPD/chronic bronchitis.

No acute superimposed process.

Aortic Atherosclerosis ([id number].C).

## 2021-10-15 LAB — PULMONARY FUNCTION TEST

## 2021-11-18 ENCOUNTER — Ambulatory Visit (INDEPENDENT_AMBULATORY_CARE_PROVIDER_SITE_OTHER): Payer: No Typology Code available for payment source | Admitting: Internal Medicine

## 2021-11-18 ENCOUNTER — Encounter: Payer: Self-pay | Admitting: Internal Medicine

## 2021-11-18 VITALS — BP 124/72 | HR 67 | Ht 68.0 in | Wt 213.6 lb

## 2021-11-18 DIAGNOSIS — R5383 Other fatigue: Secondary | ICD-10-CM | POA: Diagnosis not present

## 2021-11-18 DIAGNOSIS — R9389 Abnormal findings on diagnostic imaging of other specified body structures: Secondary | ICD-10-CM

## 2021-11-18 DIAGNOSIS — R942 Abnormal results of pulmonary function studies: Secondary | ICD-10-CM | POA: Diagnosis not present

## 2021-11-18 DIAGNOSIS — R0989 Other specified symptoms and signs involving the circulatory and respiratory systems: Secondary | ICD-10-CM

## 2021-11-18 DIAGNOSIS — R0609 Other forms of dyspnea: Secondary | ICD-10-CM

## 2021-11-18 NOTE — Progress Notes (Signed)
OV 11/18/2021  Subjective:  Patient ID: Luke Golden, male , DOB: 1946-02-16 , age 76 y.o. , MRN: [id number] , ADDRESS: [place] [id number] PCP [facility] Patient Care Team: [facility] as PCP - General  This Provider for this visit: Treatment Team:  Attending Provider: Brand Males, MD PCP Dr Rondell Reams at [facility]   11/18/2021 -   Chief Complaint  Patient presents with   Consult    Pt recently had a PFT performed and is here today to go over the results. Pt does have complaints of an occasional cough and SOB that is worse with exertion.     HPI Luke Golden 76 y.o. -referred by the [facility].  History is obtained from talking to him and his wife.  They live in [place].  Wife works as a Leisure centre manager at [employer].  He is asked weight no more veteran.  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  Denies any agent orange exposure.  Former smoker.  According to the wife for the last few years he has had exertional fatigue and also shortness of breath.  But insidiously and gradually is gotten worse.  Sometime towards the end of 2022 at the beginning of 2023 he did have a viral syndrome [facility].  Then in the visit with the [facility] in April 2023 it appears that he started complaining of exertional fatigue and shortness of breath.  Was fairly significant.  They did a chest x-ray on him on September 12, 2021.  Only have the report.  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  He has old chest x-ray here that I personally visualized shows some chronic atelectatic changes.  They did a pulmonary function test and it shows normal FEV1 FVC TLC but isolated reduction in diffusion capacity to 64.2% overall wife says that he is got a lot of fatigue dyspnea lower stamina.  Things are getting worse.  His past medical history is significant for  short-term memory loss, left femoral DVT in 2000.  Denies any substance abuse or tobacco abuse alcohol use in the last 20 years.  He has obstructive sleep apnea moderate but he has quit using his CPAP in the last week.  This is because of facial fit issues.  He also has back pain PTSD coronary artery disease hyperlipidemia acid reflux hypertension.   According to him he had a CT scan of the chest but we could not find evidence for this.  According to my CMA has had a echocardiogram at the [place] but I could not find evidence for this.  He had a good walking desaturation test here.  Simple office walk 185 feet x  3 laps goal with forehead probe 11/18/2021    O2 used ra   Number laps completed 3   Comments about pace Mod pace   Resting Pulse Ox/HR 100%% and 67/min   Final Pulse Ox/HR 98% and 107/min   Desaturated </= 88% no   Desaturated <= 3% points no   Got Tachycardic >/= 90/min yes   Symptoms at end of test None   Miscellaneous comments x        CT Chest data - Nov 2015  CLINICAL DATA:  Chest pain.   EXAM: PORTABLE CHEST - 1 VIEW   COMPARISON:  July 05, 2013.  FINDINGS: Stable cardiomediastinal silhouette. No pneumothorax or pleural effusion is noted. Minimal bibasilar subsegmental atelectasis is noted. Old right rib fractures are noted.   IMPRESSION: Minimal bibasilar subsegmental atelectasis.     Electronically Signed   By: Sabino Dick M.D.   On: 04/23/2014 12:29  No results found.  Nuclear Medicine Stress tst 2017  IMPRESSION: 1. No scintigraphic evidence of prior infarction or pharmacologically induced ischemia.   2. Normal left ventricular wall motion.   3. Left ventricular ejection fraction 59%   4. Non invasive risk stratification*: Low   *2012 Appropriate Use Criteria for Coronary Revascularization Focused Update: J Am Coll Cardiol. 5997;74(1):423-953. http://content.airportbarriers.com.aspx?articleid=1201161     Electronically Signed    By: Sandi Mariscal M.D.   On: 01/28/2016 13:07     Latest Reference Range & Units 12/31/10 15:01 01/02/11 06:15 12/18/11 11:49 12/24/11 06:43 12/25/11 05:45 07/05/13 13:43 07/12/13 13:50 07/13/13 04:24 07/14/13 07:05 04/23/14 12:11 01/27/16 14:18 01/16/18 09:32  Hemoglobin 13.0 - 17.0 g/dL 14.3 12.8 (L) 14.2 11.3 (L) 10.1 (L) 15.0 13.6 11.1 (L) 10.2 (L) 13.4 14.3 14.0  (L): Data is abnormally low  Latest Reference Range & Units 12/31/10 15:01 01/01/11 15:00 01/02/11 06:15 12/18/11 11:49 12/24/11 06:43 12/25/11 05:45 07/05/13 13:43 07/12/13 13:50 07/13/13 04:24 07/14/13 07:05 04/23/14 12:11 01/27/16 14:18 01/16/18 09:32  Creatinine 0.61 - 1.24 mg/dL 0.85 0.82 0.96 0.91 0.95 0.87 1.19 1.09 0.99 1.27 0.98 1.11 1.09     has a past medical history of Anxiety, Arthritis, Headache(784.0), Hypertension, Peripheral vascular disease ([place]), Pneumonia, Pneumothorax, and Shortness of breath.   reports that he quit smoking about 25 years ago. His smoking use included cigarettes and cigars. He has a 48.00 pack-year smoking history. He has quit using smokeless tobacco.  His smokeless tobacco use included chew.  Past Surgical History:  Procedure Laterality Date   CARDIAC CATHETERIZATION     2012 - [place],medical therapy- as follow up    COLONOSCOPY W/ BIOPSIES AND POLYPECTOMY     Hx; of   EYE SURGERY     cataracts removed - /w IOL- bilateral    FRACTURE SURGERY     R elbow- 2000   JOINT REPLACEMENT Bilateral    TONSILLECTOMY     as an adult    TOTAL KNEE ARTHROPLASTY  12/23/2011   Procedure: TOTAL KNEE ARTHROPLASTY;  Surgeon: Ninetta Lights, MD;  Location: [place];  Service: Orthopedics;  Laterality: Left;   TOTAL KNEE ARTHROPLASTY Right 07/12/2013   Procedure: TOTAL KNEE ARTHROPLASTY;  Surgeon: Ninetta Lights, MD;  Location: [place];  Service: Orthopedics;  Laterality: Right;    Allergies  Allergen Reactions   Cephalosporins Hives   Tylenol [Acetaminophen] Other (See Comments)    Feel weird       Immunization History  Administered Date(s) Administered   DTaP 09/01/2013   Fluad Quad(high Dose 65+) 05/07/2020   Influenza, High Dose Seasonal PF 04/27/2016, 04/13/2017   Influenza-Unspecified 08/10/2012, 03/14/2014   Pneumococcal Conjugate-13 07/20/2014   Pneumococcal-Unspecified 03/22/2012   Tdap 03/22/2012   Zoster Recombinat (Shingrix) 12/29/2017, 03/17/2018    Family History  Problem Relation Age of Onset   Heart disease Father    Heart disease Brother      Current Outpatient Medications:    aspirin EC 81 MG tablet, Take 81 mg by mouth daily., Disp: , Rfl:    bisacodyl (DULCOLAX) 5 MG EC tablet, Take 1 tablet (5 mg total) by mouth daily as needed for moderate constipation., Disp: 60 tablet, Rfl: 0  busPIRone (BUSPAR) 10 MG tablet, Take 15-30 mg by mouth See admin instructions. Take 15 mg by mouth in the morning and take 30 mg by mouth at bedtime, Disp: , Rfl:    docusate sodium (COLACE) 100 MG capsule, Take 300 mg by mouth daily as needed for mild constipation. , Disp: , Rfl:    lisinopril (PRINIVIL,ZESTRIL) 2.5 MG tablet, Take 2.5 mg by mouth at bedtime. , Disp: , Rfl:    memantine (NAMENDA) 10 MG tablet, TAKE ONE TABLET BY MOUTH AT BEDTIME TO SLOW MEMORY LOSS, Disp: , Rfl:    omeprazole (PRILOSEC) 40 MG capsule, TAKE ONE CAPSULE BY MOUTH TWICE A DAY FOR HEARTBURN (TAKE ON AN EMPTY STOMACH 30 MINUTES PRIOR TO A MEAL), Disp: , Rfl:    rosuvastatin (CRESTOR) 20 MG tablet, Take 10 mg by mouth daily. , Disp: , Rfl:    sildenafil (VIAGRA) 100 MG tablet, TAKE ONE TABLET BY MOUTH AS DIRECTED (TAKE 1 HOUR PRIOR TO SEXUAL ACTIVITY *DO NOT EXCEED 1 DOSE PER 24 HOUR PERIOD*), Disp: , Rfl:    simethicone (MYLICON) 80 MG chewable tablet, CHEW TWO TABLETS BY MOUTH THREE TIMES A DAY AFTER MEALS AND AT BEDTIME AS NEEDED FOR GAS, Disp: , Rfl:    vitamin B-12 (CYANOCOBALAMIN) 500 MCG tablet, Take 2 tablets by mouth daily., Disp: , Rfl:       Objective:   Vitals:   11/18/21 1635   BP: 124/72  Pulse: 67  SpO2: 100%  Weight: 213 lb 9.6 oz (96.9 kg)  Height: $Remove'5\' 8"'YWkCaed$  (1.727 m)    Estimated body mass index is 32.48 kg/m as calculated from the following:   Height as of this encounter: $RemoveBeforeD'5\' 8"'YQAthjqIxcFAPT$  (1.727 m).   Weight as of this encounter: 213 lb 9.6 oz (96.9 kg).  $Rem'@WEIGHTCHANGE'KmQt$ @  [employer]   11/18/21 1635  Weight: 213 lb 9.6 oz (96.9 kg)     Physical Exam General: No distress. Well built Neuro: Alert and Oriented x 3. GCS 15. Speech normal Psych: Pleasant Resp:  Barrel Chest - no.  Wheeze - no, Crackles - YES AT BASE, No overt respiratory distress CVS: Normal heart sounds. Murmurs - no Ext: Stigmata of Connective Tissue Disease - no HEENT: Normal upper airway. PEERL +. No post nasal drip        Assessment:       ICD-10-CM   1. DOE (dyspnea on exertion)  R06.09 Sedimentation rate    ANA    Anti-DNA antibody, double-stranded    Rheumatoid factor    Cyclic citrul peptide antibody, IgG    Sjogren's syndrome antibods(ssa + ssb)    Anti-scleroderma antibody    ANCA screen with reflex titer    Mpo/pr-3 (anca) antibodies    CK total and CKMB (cardiac)not at [facility]    Aldolase    Hypersensitivity Pneumonitis    QuantiFERON-TB Gold Plus    Brain natriuretic peptide    Pulmonary function test    Pulse oximetry, overnight    CT Chest High Resolution    CANCELED: ECHOCARDIOGRAM COMPLETE    2. Other fatigue  R53.83     3. Bibasilar crackles  R09.89     4. Abnormal PFT  R94.2 Pulmonary function test    5. Abnormal chest x-ray  R93.89 CT Chest High Resolution         Plan:     Patient Instructions     ICD-10-CM   1. DOE (dyspnea on exertion)  R06.09     2. Other fatigue  R53.83     3. Bibasilar crackles  R09.89     4. Abnormal PFT  R94.2     5. Abnormal chest x-ray  R93.89        - I am concerned you  have Interstitial Lung Disease (ILD) AKA  Pulmonary Fibrosis (PF)  -  There are MANY varieties of this - To narrow down possibilities and  assess severity please do the following tests  - take ILD questionnaire with you and fill it and  bring it with you next visit  - do full PFT  - do overnight oxygen test on room air (can be without cPAP)  - do High Resolution CT chest wo contrast - supine and prone, inspiratory and expiratory images (only Dr Rosario Jacks or Dr Weber Cooks or Dr Polly Cobia or Dr Laqueta Carina or Dr Burt Ek to read)  - do autoimmune panel: Serum: ESR, ANA, DS-DNA, RF, anti-CCP, ssA, ssB, scl-70, ANCA, MPO and PR-3 antibodies, Total CK,  Aldolase,  Hypersensitivity Pneumonitis Panel, BNP, Quantiferon Gold   - do ECHO  Followup - next few weeks to see DR Chase Caller or APP - 30 min visit  - to discuss next steps; likely there is enough information to make specific diagnosis and discuss treatment     SIGNATURE    Dr. Brand Males, M.D., F.C.C.P,  Pulmonary and Critical Care Medicine Staff Physician, [place] Director - Interstitial Lung Disease  Program  Pulmonary [place] at [place], [place], [address]  Pager: [phone], If no answer or between  15:00h - 7:00h: call [phone] Telephone: [phone]  5:50 PM 11/18/2021

## 2021-11-18 NOTE — Patient Instructions (Addendum)
ICD-10-CM   1. DOE (dyspnea on exertion)  R06.09     2. Other fatigue  R53.83     3. Bibasilar crackles  R09.89     4. Abnormal PFT  R94.2     5. Abnormal chest x-ray  R93.89        - I am concerned you  have Interstitial Lung Disease (ILD) AKA  Pulmonary Fibrosis (PF)  -  There are MANY varieties of this - To narrow down possibilities and assess severity please do the following tests  - take ILD questionnaire with you and fill it and  bring it with you next visit  - do full PFT  - do overnight oxygen test on room air (can be without cPAP)  - do High Resolution CT chest wo contrast - supine and prone, inspiratory and expiratory images (only Dr Rosario Jacks or Dr Weber Cooks or Dr Polly Cobia or Dr Laqueta Carina or Dr Burt Ek to read)  - do autoimmune panel: Serum: ESR, ANA, DS-DNA, RF, anti-CCP, ssA, ssB, scl-70, ANCA, MPO and PR-3 antibodies, Total CK,  Aldolase,  Hypersensitivity Pneumonitis Panel, BNP, Quantiferon Gold   - do ECHO  Followup - next few weeks to see DR Chase Caller or APP - 30 min visit  - to discuss next steps; likely there is enough information to make specific diagnosis and discuss treatment

## 2021-11-20 ENCOUNTER — Other Ambulatory Visit (INDEPENDENT_AMBULATORY_CARE_PROVIDER_SITE_OTHER): Payer: No Typology Code available for payment source

## 2021-11-20 DIAGNOSIS — R0609 Other forms of dyspnea: Secondary | ICD-10-CM

## 2021-11-20 LAB — SEDIMENTATION RATE: Sed Rate: 12 mm/hr (ref 0–20)

## 2021-11-20 LAB — BRAIN NATRIURETIC PEPTIDE: Pro B Natriuretic peptide (BNP): 97 pg/mL (ref 0.0–100.0)

## 2021-11-21 LAB — CK TOTAL AND CKMB (NOT AT ARMC): CK, MB: 0.7 ng/mL (ref 0–5.0)

## 2021-11-23 LAB — QUANTIFERON-TB GOLD PLUS
Mitogen-NIL: 6.97 IU/mL
NIL: 0.75 IU/mL
QuantiFERON-TB Gold Plus: POSITIVE — AB
TB1-NIL: 2.55 IU/mL
TB2-NIL: 2.38 IU/mL

## 2021-11-24 LAB — MPO/PR-3 (ANCA) ANTIBODIES
Myeloperoxidase Abs: <1 AI (ref <1.0)
Serine Protease 3: <1 AI (ref <1.0)

## 2021-11-25 ENCOUNTER — Telehealth: Payer: Self-pay | Admitting: Internal Medicine

## 2021-11-25 LAB — ALDOLASE: Aldolase: 2.9 U/L (ref <=8.1)

## 2021-11-25 LAB — CK TOTAL AND CKMB (NOT AT ARMC)
Relative Index: 1 (ref 0–4.0)
Total CK: 68 U/L (ref 44–196)

## 2021-11-26 LAB — HYPERSENSITIVITY PNEUMONITIS
A. Pullulans Abs: NEGATIVE
A.Fumigatus #1 Abs: NEGATIVE
Micropolyspora faeni, IgG: NEGATIVE
Pigeon Serum Abs: NEGATIVE
Thermoact. Saccharii: NEGATIVE
Thermoactinomyces vulgaris, IgG: NEGATIVE

## 2021-11-27 LAB — CYCLIC CITRUL PEPTIDE ANTIBODY, IGG: Cyclic Citrullin Peptide Ab: <16 UNITS

## 2021-11-27 LAB — ANTI-NUCLEAR AB-TITER (ANA TITER)
ANA TITER: 1:80 {titer} — ABNORMAL HIGH
ANA Titer 1: 1:320 {titer} — ABNORMAL HIGH

## 2021-11-27 LAB — ANCA SCREEN W REFLEX TITER: ANCA SCREEN: NEGATIVE

## 2021-11-27 LAB — ANTI-SCLERODERMA ANTIBODY: Scleroderma (Scl-70) (ENA) Antibody, IgG: <1 AI

## 2021-11-27 LAB — ANA: Anti Nuclear Antibody (ANA): POSITIVE — AB

## 2021-11-27 LAB — SJOGREN'S SYNDROME ANTIBODS(SSA + SSB)
SSA (Ro) (ENA) Antibody, IgG: <1 AI
SSB (La) (ENA) Antibody, IgG: <1 AI

## 2021-11-27 LAB — ANTI-DNA ANTIBODY, DOUBLE-STRANDED: ds DNA Ab: 1 IU/mL

## 2021-12-08 ENCOUNTER — Ambulatory Visit (HOSPITAL_COMMUNITY)
Admission: RE | Admit: 2021-12-08 | Discharge: 2021-12-08 | Disposition: A | Discharge status: Home / Self Care | Payer: No Typology Code available for payment source | Source: Ambulatory Visit | Attending: Internal Medicine | Admitting: Internal Medicine

## 2021-12-08 DIAGNOSIS — R0609 Other forms of dyspnea: Secondary | ICD-10-CM | POA: Insufficient documentation

## 2021-12-08 DIAGNOSIS — R9389 Abnormal findings on diagnostic imaging of other specified body structures: Secondary | ICD-10-CM | POA: Insufficient documentation

## 2021-12-22 ENCOUNTER — Ambulatory Visit (INDEPENDENT_AMBULATORY_CARE_PROVIDER_SITE_OTHER): Payer: No Typology Code available for payment source | Admitting: Nurse Practitioner

## 2021-12-22 ENCOUNTER — Encounter: Payer: Self-pay | Admitting: Nurse Practitioner

## 2021-12-22 ENCOUNTER — Ambulatory Visit (INDEPENDENT_AMBULATORY_CARE_PROVIDER_SITE_OTHER): Payer: No Typology Code available for payment source | Admitting: Internal Medicine

## 2021-12-22 ENCOUNTER — Telehealth: Payer: Self-pay | Admitting: Nurse Practitioner

## 2021-12-22 VITALS — BP 120/82 | HR 65 | Temp 98.2°F | Ht 68.0 in | Wt 210.0 lb

## 2021-12-22 DIAGNOSIS — J84112 Idiopathic pulmonary fibrosis: Secondary | ICD-10-CM | POA: Diagnosis not present

## 2021-12-22 DIAGNOSIS — R0609 Other forms of dyspnea: Secondary | ICD-10-CM | POA: Diagnosis not present

## 2021-12-22 DIAGNOSIS — Z9989 Dependence on other enabling machines and devices: Secondary | ICD-10-CM

## 2021-12-22 DIAGNOSIS — R7612 Nonspecific reaction to cell mediated immunity measurement of gamma interferon antigen response without active tuberculosis: Secondary | ICD-10-CM | POA: Diagnosis not present

## 2021-12-22 DIAGNOSIS — R942 Abnormal results of pulmonary function studies: Secondary | ICD-10-CM

## 2021-12-22 DIAGNOSIS — G4733 Obstructive sleep apnea (adult) (pediatric): Secondary | ICD-10-CM

## 2021-12-22 MED ORDER — ALBUTEROL SULFATE HFA 108 (90 BASE) MCG/ACT IN AERS: 2.0000 | INHALATION_SPRAY | Freq: Four times a day (QID) | RESPIRATORY_TRACT | 2 refills | Status: DC | PRN

## 2021-12-22 NOTE — Assessment & Plan Note (Signed)
See above plan. 

## 2021-12-22 NOTE — Progress Notes (Deleted)
Full PFT performed today. °

## 2021-12-22 NOTE — Progress Notes (Signed)
Spirometry/DLCO and Pleth performed today. Patient unable to complete post spirometry without error. 
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
## 2021-12-22 NOTE — Assessment & Plan Note (Addendum)
No known exposures and no evidence of active TB on imaging upon my review. We will refer him to infectious disease for further management.

## 2021-12-22 NOTE — Patient Instructions (Addendum)
Spirometry/DLCO and Pleth performed today. Patient unable to complete post spirometry without error.

## 2021-12-22 NOTE — Assessment & Plan Note (Addendum)
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 No evidence of restriction on PFTs; does have a mild reduction in DLCO. Reviewed disease process and progression of disease. Provided with IPF pamphlet to review. Discussed the purpose of antifibrotic therapies. He was relatively adamant that he would not take any more medications; however, his wife requested information on the medications to review. Echocardiogram ordered today. We will check on ONO to see why this has yet to be scheduled. Previous walking oximetry without desaturations. Trial albuterol PRN. Provided with ILD questionnaire to complete and return at follow up.  Patient Instructions  Trial Albuterol inhaler 2 puffs every 6 hours as needed for shortness of breath or wheezing.   Echocardiogram ordered today - someone will contact you for scheduling  Labs today   Referral to infectious disease - someone will contact you to schedule this.  Review information on pulmonary fibrosis and antifibrotic therapy provided to you today. We will discuss at your follow up unless you decide sooner that you would like to start therapy.  Follow up in 4-6 weeks with Dr. [name] or Luke Golden. If symptoms do not improve or worsen, please contact office for sooner follow up or seek emergency care.

## 2021-12-22 NOTE — Patient Instructions (Addendum)
Trial Albuterol inhaler 2 puffs every 6 hours as needed for shortness of breath or wheezing.   Echocardiogram ordered today - someone will contact you for scheduling  Labs today   Referral to infectious disease - someone will contact you to schedule this.  Review information on pulmonary fibrosis and antifibrotic therapy provided to you today. We will discuss at your follow up unless you decide sooner that you would like to start therapy.  Follow up in 4-6 weeks with Dr. Marchelle Gearing or Philis Nettle. If symptoms do not improve or worsen, please contact office for sooner follow up or seek emergency care.

## 2021-12-22 NOTE — Assessment & Plan Note (Signed)
Followed by [facility]. He had previously stopped using it last he was here; has since restarted. Feels like he receives good benefit from use. Fatigue is better with CPAP use. We discussed how untreated sleep apnea puts an individual at risk for cardiac arrhthymias, pulm HTN, DM, stroke and increases their risk for daytime accidents. Recommended continued nightly usage.

## 2021-12-22 NOTE — Telephone Encounter (Signed)
Called and informed patient that since he has filled out packet before he doe snot need to fill out another ILD packet  Patient verbalized understanding. Nothing further needed

## 2021-12-25 LAB — PULMONARY FUNCTION TEST
DL/VA % pred: 81 %
DL/VA: 3.25 ml/min/mmHg/L
DLCO cor % pred: 73 %
DLCO cor: 17.19 ml/min/mmHg
DLCO unc % pred: 73 %
DLCO unc: 17.19 ml/min/mmHg
FEF 25-75 Pre: 3.24 L/sec
FEF2575-%Pred-Pre: 162 %
FEV1-%Pred-Pre: 112 %
FEV1-Pre: 3.13 L
FEV1FVC-%Pred-Pre: 114 %
FEV6-%Pred-Pre: 105 %
FEV6-Pre: 3.8 L
FEV6FVC-%Pred-Pre: 107 %
FVC-%Pred-Pre: 98 %
FVC-Pre: 3.8 L
Pre FEV1/FVC ratio: 83 %
Pre FEV6/FVC Ratio: 100 %
RV % pred: 91 %
RV: 2.27 L
TLC % pred: 89 %
TLC: 5.98 L

## 2021-12-29 ENCOUNTER — Telehealth: Payer: Self-pay | Admitting: Internal Medicine

## 2021-12-29 NOTE — Telephone Encounter (Signed)
Called patient and he states that he had an echo done at the [place]. Patient states he is unsure when it was done. [facility] Patient is wondering if another echo is needed?  Please advise sir

## 2021-12-31 NOTE — Telephone Encounter (Signed)
If is > 1 year - needs another echo but we can get from [facility] and see. Has been challenging to get results from [facility] but can try

## 2021-12-31 NOTE — Telephone Encounter (Signed)
Spoke with [name]  She states that the ECHO done at the [place] has definitely been completed in the past year  She sent request through [place] pt portal to have results faxed over  Sending to Irving Burton to keep an eye out to confirm this, thanks!

## 2022-01-01 ENCOUNTER — Ambulatory Visit (INDEPENDENT_AMBULATORY_CARE_PROVIDER_SITE_OTHER): Payer: No Typology Code available for payment source | Admitting: Internal Medicine

## 2022-01-01 ENCOUNTER — Other Ambulatory Visit: Payer: Self-pay

## 2022-01-01 ENCOUNTER — Other Ambulatory Visit (HOSPITAL_COMMUNITY): Payer: Self-pay

## 2022-01-01 ENCOUNTER — Ambulatory Visit (INDEPENDENT_AMBULATORY_CARE_PROVIDER_SITE_OTHER): Payer: No Typology Code available for payment source

## 2022-01-01 DIAGNOSIS — Z23 Encounter for immunization: Secondary | ICD-10-CM

## 2022-01-01 DIAGNOSIS — R7612 Nonspecific reaction to cell mediated immunity measurement of gamma interferon antigen response without active tuberculosis: Secondary | ICD-10-CM | POA: Diagnosis not present

## 2022-01-01 MED ORDER — RIFAMPIN 300 MG PO CAPS: 600.0000 mg | ORAL_CAPSULE | Freq: Every day | ORAL | 2 refills | Status: DC

## 2022-01-01 MED ORDER — ISONIAZID 300 MG PO TABS: 300.0000 mg | ORAL_TABLET | Freq: Every day | ORAL | 2 refills | Status: DC

## 2022-01-01 MED ORDER — VITAMIN B-6 50 MG PO TABS: 50.0000 mg | ORAL_TABLET | Freq: Every day | ORAL | 2 refills | Status: DC

## 2022-01-01 NOTE — Patient Instructions (Signed)
You may notice some soreness and swelling in your arm, low-grade fever, headache and muscle aches for 24 to 48 hours after receiving your COVID-vaccine.  This is a normal response to the vaccine

## 2022-01-01 NOTE — Progress Notes (Signed)
   Covid-19 Vaccination Clinic  Name:  Luke Golden    MRN: [id number] DOB: 03-22-1946  01/01/2022  Luke Golden was observed post Covid-19 immunization for 15 minutes without incident. He was provided with Vaccine Information Sheet and instruction to access the V-Safe system.   Luke Golden was instructed to call 911 with any severe reactions post vaccine: Difficulty breathing  Swelling of face and throat  A fast heartbeat  A bad rash all over body  Dizziness and weakness   Immunizations Administered     Name Date Dose VIS Date Route   Pfizer Covid-19 Vaccine Bivalent Booster 01/01/2022 11:56 AM 0.3 mL 01/29/2021 Intramuscular   Manufacturer: [employer], [employer]   Lot: [id number]   NDC: [address]-0      Juanita Laster, RMA

## 2022-01-01 NOTE — Addendum Note (Signed)
Addended by: Juanita Laster on: 01/01/2022 03:30 PM   Modules accepted: Orders
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
## 2022-01-02 LAB — COMPLETE METABOLIC PANEL WITH GFR
AG Ratio: 1.7 (calc) (ref 1.0–2.5)
ALT: 16 U/L (ref 9–46)
AST: 24 U/L (ref 10–35)
Albumin: 4.3 g/dL (ref 3.6–5.1)
Alkaline phosphatase (APISO): 99 U/L (ref 35–144)
BUN: 19 mg/dL (ref 7–25)
CO2: 27 mmol/L (ref 20–32)
Calcium: 9.4 mg/dL (ref 8.6–10.3)
Chloride: 103 mmol/L (ref 98–110)
Creat: 1.18 mg/dL (ref 0.70–1.28)
Globulin: 2.6 g/dL (calc) (ref 1.9–3.7)
Glucose, Bld: 91 mg/dL (ref 65–99)
Potassium: 4.6 mmol/L (ref 3.5–5.3)
Sodium: 139 mmol/L (ref 135–146)
Total Bilirubin: 0.4 mg/dL (ref 0.2–1.2)
Total Protein: 6.9 g/dL (ref 6.1–8.1)
eGFR: 64 mL/min/{1.73_m2} (ref >=60)

## 2022-01-02 LAB — CBC WITH DIFFERENTIAL/PLATELET
Absolute Monocytes: 618 cells/uL (ref 200–950)
Basophils Absolute: 78 cells/uL (ref 0–200)
Basophils Relative: 1.2 %
Eosinophils Absolute: 351 cells/uL (ref 15–500)
Eosinophils Relative: 5.4 %
HCT: 40.5 % (ref 38.5–50.0)
Hemoglobin: 13.8 g/dL (ref 13.2–17.1)
Lymphs Abs: 1827 cells/uL (ref 850–3900)
MCH: 33.2 pg — ABNORMAL HIGH (ref 27.0–33.0)
MCHC: 34.1 g/dL (ref 32.0–36.0)
MCV: 97.4 fL (ref 80.0–100.0)
MPV: 11.3 fL (ref 7.5–12.5)
Monocytes Relative: 9.5 %
Neutro Abs: 3627 cells/uL (ref 1500–7800)
Neutrophils Relative %: 55.8 %
Platelets: 174 10*3/uL (ref 140–400)
RBC: 4.16 10*6/uL — ABNORMAL LOW (ref 4.20–5.80)
RDW: 11.5 % (ref 11.0–15.0)
Total Lymphocyte: 28.1 %
WBC: 6.5 10*3/uL (ref 3.8–10.8)

## 2022-01-02 LAB — HEPATITIS C ANTIBODY: Hepatitis C Ab: NONREACTIVE

## 2022-01-02 LAB — HIV ANTIBODY (ROUTINE TESTING W REFLEX): HIV 1&2 Ab, 4th Generation: NONREACTIVE

## 2022-01-04 ENCOUNTER — Encounter: Payer: Self-pay | Admitting: Internal Medicine

## 2022-01-05 NOTE — Telephone Encounter (Signed)
MR, please see pt email from wife regarding letter out of work. Thanks.

## 2022-01-06 ENCOUNTER — Encounter: Payer: Self-pay | Admitting: Internal Medicine

## 2022-01-06 NOTE — Telephone Encounter (Signed)
Mychart message sent by pt's spouse stating that they still have not heard from the [place] about pt having ONO performed. Please advise if you might have any update on this for [place].

## 2022-01-07 NOTE — Telephone Encounter (Signed)
Kelly from [place] called me back.  They do have the order.  She made them aware pt has an appt here at the end of the month and needs to be scheduled.  They will be contacting the pt.  Will send to triage so they can make pt aware thru MyChart.

## 2022-01-07 NOTE — Telephone Encounter (Signed)
I e-mailed order to [facility] at [facility] on 6/21.  I just called nurse navigator [place] at [facility] to check status for me and had to leave a vm for her to call me back.

## 2022-01-08 NOTE — Telephone Encounter (Signed)
She has to apply fo FMLA tat work, The HR dept at her work place witll give FMLA form This will allow her to be intermittently absent for husband care needs. A  [facility] camn usually fill FMLA  Outsode of that how does a letter from me to her employer help her/?

## 2022-01-08 NOTE — Telephone Encounter (Signed)
Mychart message sent by pt's spouse. Let her know that we had not received the results of the CT and for [facility] to [facility] to [facility]. Please refer to that mychart encounter.

## 2022-01-09 ENCOUNTER — Encounter: Payer: Self-pay | Admitting: Internal Medicine

## 2022-01-10 ENCOUNTER — Encounter: Payer: Self-pay | Admitting: Internal Medicine

## 2022-01-12 ENCOUNTER — Encounter: Payer: Self-pay | Admitting: Internal Medicine

## 2022-01-13 ENCOUNTER — Telehealth: Payer: Self-pay | Admitting: Internal Medicine

## 2022-01-13 ENCOUNTER — Encounter: Payer: Self-pay | Admitting: Internal Medicine

## 2022-01-13 NOTE — Telephone Encounter (Signed)
Received the following message from patient's wife [name]:   "Hello,   I finally heard from [place] to get this scheduled. They don't have any openings until late October. Since our appointment is the end of this month, please confirm if ok to schedule that far out.   Thank you,   [name]"  Order was placed on 11/18/21 for the patient to have an ONO on room air. Patient is scheduled for an appt with MR on 01/27/22 at 10am.   MR, please advise if it is ok for him to wait until October for this test. I'm not sure if we can send the order to a local DME and the [facility] will [facility] it.

## 2022-01-13 NOTE — Telephone Encounter (Signed)
See Mychart message from 01/13/22.

## 2022-01-13 NOTE — Telephone Encounter (Signed)
Showed it is fine to wait to have the OR no test done but he can still come for the appointment I had of that test.  No problem

## 2022-01-14 ENCOUNTER — Encounter: Payer: Self-pay | Admitting: Internal Medicine

## 2022-01-14 NOTE — Telephone Encounter (Signed)
Holly, did you send a message to MR via another forum? I do not see where MR has been messaged. Please advise. Thanks.

## 2022-01-15 ENCOUNTER — Encounter: Payer: Self-pay | Admitting: Internal Medicine

## 2022-01-15 ENCOUNTER — Telehealth: Payer: Self-pay | Admitting: Internal Medicine

## 2022-01-15 NOTE — Telephone Encounter (Signed)
Called wife back and let her know that I am sending office notes to the [place] as requested. Nothing further needed

## 2022-01-16 NOTE — Telephone Encounter (Signed)
Is he on esbiret/ofev? If not then I do not kow cause of shakes. HE will need acute visit to see if tis related to desats

## 2022-01-16 NOTE — Telephone Encounter (Signed)
MR, please advise on pt's email. Thanks.

## 2022-01-21 NOTE — Telephone Encounter (Signed)
I replied on 01/16/22 - saying if he is not on esbriet or ofev I do not know the cause of the shakes. ANd he needs an acute visit     Current Outpatient Medications:    albuterol (VENTOLIN HFA) 108 (90 Base) MCG/ACT inhaler, Inhale 2 puffs into the lungs every 6 (six) hours as needed for wheezing or shortness of breath. (Patient not taking: Reported on 01/01/2022), Disp: 8 g, Rfl: 2   aspirin EC 81 MG tablet, Take 81 mg by mouth daily., Disp: , Rfl:    bisacodyl (DULCOLAX) 5 MG EC tablet, Take 1 tablet (5 mg total) by mouth daily as needed for moderate constipation. (Patient not taking: Reported on 01/01/2022), Disp: 60 tablet, Rfl: 0   busPIRone (BUSPAR) 10 MG tablet, Take 15-30 mg by mouth See admin instructions. Take 15 mg by mouth in the morning and take 30 mg by mouth at bedtime, Disp: , Rfl:    docusate sodium (COLACE) 100 MG capsule, Take 300 mg by mouth daily as needed for mild constipation. , Disp: , Rfl:    isoniazid (NYDRAZID) 300 MG tablet, Take 1 tablet (300 mg total) by mouth daily., Disp: 30 tablet, Rfl: 2   lisinopril (PRINIVIL,ZESTRIL) 2.5 MG tablet, Take 2.5 mg by mouth at bedtime. , Disp: , Rfl:    memantine (NAMENDA) 10 MG tablet, TAKE ONE TABLET BY MOUTH AT BEDTIME TO SLOW MEMORY LOSS, Disp: , Rfl:    omeprazole (PRILOSEC) 40 MG capsule, TAKE ONE CAPSULE BY MOUTH TWICE A DAY FOR HEARTBURN (TAKE ON AN EMPTY STOMACH 30 MINUTES PRIOR TO A MEAL), Disp: , Rfl:    pyridOXINE (VITAMIN B6) 50 MG tablet, Take 1 tablet (50 mg total) by mouth daily., Disp: 30 tablet, Rfl: 2   rifampin (RIFADIN) 300 MG capsule, Take 2 capsules (600 mg total) by mouth daily., Disp: 60 capsule, Rfl: 2   rosuvastatin (CRESTOR) 20 MG tablet, Take 10 mg by mouth daily. , Disp: , Rfl:    sildenafil (VIAGRA) 100 MG tablet, TAKE ONE TABLET BY MOUTH AS DIRECTED (TAKE 1 HOUR PRIOR TO SEXUAL ACTIVITY *DO NOT EXCEED 1 DOSE PER 24 HOUR PERIOD*), Disp: , Rfl:    simethicone (MYLICON) 80 MG chewable tablet, CHEW TWO TABLETS  BY MOUTH THREE TIMES A DAY AFTER MEALS AND AT BEDTIME AS NEEDED FOR GAS, Disp: , Rfl:    vitamin B-12 (CYANOCOBALAMIN) 500 MCG tablet, Take 2 tablets by mouth daily. (Patient not taking: Reported on 01/01/2022), Disp: , Rfl:  s:.

## 2022-01-27 ENCOUNTER — Encounter: Payer: Self-pay | Admitting: *Deleted

## 2022-01-27 ENCOUNTER — Encounter: Payer: Self-pay | Admitting: Internal Medicine

## 2022-01-27 ENCOUNTER — Ambulatory Visit (INDEPENDENT_AMBULATORY_CARE_PROVIDER_SITE_OTHER): Payer: No Typology Code available for payment source | Admitting: Internal Medicine

## 2022-01-27 VITALS — BP 130/70 | HR 62 | Temp 97.5°F | Ht 68.0 in | Wt 216.8 lb

## 2022-01-27 DIAGNOSIS — Z77098 Contact with and (suspected) exposure to other hazardous, chiefly nonmedicinal, chemicals: Secondary | ICD-10-CM | POA: Diagnosis not present

## 2022-01-27 DIAGNOSIS — Z636 Dependent relative needing care at home: Secondary | ICD-10-CM

## 2022-01-27 DIAGNOSIS — R296 Repeated falls: Secondary | ICD-10-CM

## 2022-01-27 DIAGNOSIS — J84112 Idiopathic pulmonary fibrosis: Secondary | ICD-10-CM | POA: Diagnosis not present

## 2022-01-27 DIAGNOSIS — Z87891 Personal history of nicotine dependence: Secondary | ICD-10-CM

## 2022-01-27 DIAGNOSIS — R251 Tremor, unspecified: Secondary | ICD-10-CM

## 2022-01-27 DIAGNOSIS — R7612 Nonspecific reaction to cell mediated immunity measurement of gamma interferon antigen response without active tuberculosis: Secondary | ICD-10-CM

## 2022-01-27 LAB — CBC WITH DIFFERENTIAL/PLATELET
Basophils Absolute: 0.1 10*3/uL (ref 0.0–0.1)
Basophils Relative: 1.1 % (ref 0.0–3.0)
Eosinophils Absolute: 0.5 10*3/uL (ref 0.0–0.7)
Eosinophils Relative: 7.9 % — ABNORMAL HIGH (ref 0.0–5.0)
HCT: 41.1 % (ref 39.0–52.0)
Hemoglobin: 14 g/dL (ref 13.0–17.0)
Lymphocytes Relative: 23.5 % (ref 12.0–46.0)
Lymphs Abs: 1.5 10*3/uL (ref 0.7–4.0)
MCHC: 33.9 g/dL (ref 30.0–36.0)
MCV: 96.3 fl (ref 78.0–100.0)
Monocytes Absolute: 0.7 10*3/uL (ref 0.1–1.0)
Monocytes Relative: 10.5 % (ref 3.0–12.0)
Neutro Abs: 3.7 10*3/uL (ref 1.4–7.7)
Neutrophils Relative %: 57 % (ref 43.0–77.0)
Platelets: 154 10*3/uL (ref 150.0–400.0)
RBC: 4.27 Mil/uL (ref 4.22–5.81)
RDW: 13 % (ref 11.5–15.5)
WBC: 6.5 10*3/uL (ref 4.0–10.5)

## 2022-01-27 LAB — HEPATIC FUNCTION PANEL
ALT: 40 U/L (ref 0–53)
AST: 44 U/L — ABNORMAL HIGH (ref 0–37)
Albumin: 4.2 g/dL (ref 3.5–5.2)
Alkaline Phosphatase: 88 U/L (ref 39–117)
Bilirubin, Direct: 0.2 mg/dL (ref 0.0–0.3)
Total Bilirubin: 0.5 mg/dL (ref 0.2–1.2)
Total Protein: 7.6 g/dL (ref 6.0–8.3)

## 2022-01-27 LAB — COMPREHENSIVE METABOLIC PANEL
ALT: 40 U/L (ref 0–53)
AST: 44 U/L — ABNORMAL HIGH (ref 0–37)
Albumin: 4.2 g/dL (ref 3.5–5.2)
Alkaline Phosphatase: 88 U/L (ref 39–117)
BUN: 16 mg/dL (ref 6–23)
CO2: 25 mEq/L (ref 19–32)
Calcium: 9.7 mg/dL (ref 8.4–10.5)
Chloride: 102 mEq/L (ref 96–112)
Creatinine, Ser: 1.09 mg/dL (ref 0.40–1.50)
GFR: 65.91 mL/min (ref >60.00)
Glucose, Bld: 86 mg/dL (ref 70–99)
Potassium: 4.2 mEq/L (ref 3.5–5.1)
Sodium: 137 mEq/L (ref 135–145)
Total Bilirubin: 0.5 mg/dL (ref 0.2–1.2)
Total Protein: 7.6 g/dL (ref 6.0–8.3)

## 2022-01-27 LAB — BASIC METABOLIC PANEL
BUN: 16 mg/dL (ref 6–23)
CO2: 25 mEq/L (ref 19–32)
Calcium: 9.7 mg/dL (ref 8.4–10.5)
Chloride: 102 mEq/L (ref 96–112)
Creatinine, Ser: 1.09 mg/dL (ref 0.40–1.50)
GFR: 65.91 mL/min (ref >60.00)
Glucose, Bld: 86 mg/dL (ref 70–99)
Potassium: 4.2 mEq/L (ref 3.5–5.1)
Sodium: 137 mEq/L (ref 135–145)

## 2022-01-27 NOTE — Progress Notes (Signed)
OV 11/18/2021  Subjective:  Patient ID: Luke Golden, male , DOB: 08-07-1945 , age 76 y.o. , MRN: [id number] , ADDRESS: [place] [id number] PCP [facility] Patient Care Team: [facility] as PCP - General  This Provider for this visit: Treatment Team:  Attending Provider: Brand Males, MD PCP Dr Rondell Reams at [facility]   11/18/2021 -   Chief Complaint  Patient presents with   Consult    Pt recently had a PFT performed and is here today to go over the results. Pt does have complaints of an occasional cough and SOB that is worse with exertion.     HPI Luke Golden 76 y.o. -referred by the [facility].  History is obtained from talking to him and his wife.  They live in [place].  Wife works as a Leisure centre manager at [employer].  He is asked weight no more veteran.  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  Denies any agent orange exposure.  Former smoker.  According to the wife for the last few years he has had exertional fatigue and also shortness of breath.  But insidiously and gradually is gotten worse.  Sometime towards the end of 2022 at the beginning of 2023 he did have a viral syndrome [facility].  Then in the visit with the [facility] in April 2023 it appears that he started complaining of exertional fatigue and shortness of breath.  Was fairly significant.  They did a chest x-ray on him on September 12, 2021.  Only have the report.  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  He has old chest x-ray here that I personally visualized shows some chronic atelectatic changes.  They did a pulmonary function test and it shows normal FEV1 FVC TLC but isolated reduction in diffusion capacity to 64.2% overall wife says that he is got a lot of fatigue dyspnea lower stamina.  Things are getting worse.  His past medical history is significant for short-term  memory loss, left femoral DVT in 2000.  Denies any substance abuse or tobacco abuse alcohol use in the last 20 years.  He has obstructive sleep apnea moderate but he has quit using his CPAP in the last week.  This is because of facial fit issues.  He also has back pain PTSD coronary artery disease hyperlipidemia acid reflux hypertension.   According to him he had a CT scan of the chest but we could not find evidence for this.  According to my CMA has had a echocardiogram at the [place] but I could not find evidence for this.  He had a good walking desaturation test here.     CT Chest data - Nov 2015  CLINICAL DATA:  Chest pain.   EXAM: PORTABLE CHEST - 1 VIEW   COMPARISON:  July 05, 2013.   FINDINGS: Stable cardiomediastinal silhouette. No pneumothorax or pleural effusion is noted. Minimal bibasilar subsegmental atelectasis is noted. Old right rib fractures are noted.   IMPRESSION: Minimal bibasilar subsegmental atelectasis.     Electronically Signed   By: Sabino Dick M.D.   On: 04/23/2014 12:29  No results found.  Nuclear Medicine Stress tst 2017  IMPRESSION: 1. No scintigraphic evidence of prior infarction or pharmacologically induced ischemia.   2. Normal left ventricular wall motion.   3. Left ventricular ejection fraction 59%  4. Non invasive risk stratification*: Low   *2012 Appropriate Use Criteria for Coronary Revascularization Focused Update: J Am Coll Cardiol. 2119;41(7):408-144. http://content.airportbarriers.com.aspx?articleid=[email or web address]     Electronically Signed   By: Sandi Mariscal M.D.   On: 01/28/2016 13:07       12/22/2021: Today-follow-up Patient presents today for follow-up after undergoing high-resolution CT scan, ILD panel and pulmonary function testing.  Pulmonary function testing was completed today and showed normal spirometry and lung volumes.  He did have a mild reduction in DLCO at 73%.  He was unable to complete postbronchodilator  spirometry.  ILD panel was negative aside from low positive ANA and QuantiFERON gold.  No active evidence of TB on recent imaging.  He has never been told in the past that he has TB or has been exposed to it.  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  Today, he reports feeling unchanged when compared to when he was here last.  Breathing is overall stable.  He does get winded, primarily with climbing and long distances.  He also has ongoing issues with fatigue; however, he had stopped using his CPAP, which could be a large contributing factor.  He has since restarted and feels like he has gotten more restful sleep and wakes up feeling better in the morning.  He denies any recent fevers, night sweats, hemoptysis, weight loss, anorexia, lower extremity edema, orthopnea. Denies joint pain, dry mouth or eyes. He has yet to complete ONO and echocardiogram.  Never tried any inhalers for his shortness of breath.   IMPRESSION: 1. Findings are indicative of interstitial lung disease, clearly progressive compared to remote prior study from 2012, with a spectrum of findings considered probable usual interstitial pneumonia (UIP) per current ATS guidelines. Repeat high-resolution chest CT is suggested in 12 months to assess for temporal changes in the appearance of the lung parenchyma. 2. Aortic atherosclerosis, in addition to left main and three-vessel coronary artery disease. Assessment for potential risk factor modification, dietary therapy or pharmacologic therapy may be warranted, if clinically indicated. 3. There are calcifications of the aortic valve and mitral annulus. Echocardiographic correlation for evaluation of potential valvular dysfunction may be warranted if clinically indicated. 4. Cholelithiasis.   Aortic Atherosclerosis (ICD10-I70.0).     Electronically Signed   By: Vinnie Langton M.D.   On: 12/09/2021  09:38  OV 01/27/2022  Subjective:  Patient ID: Luke Golden, male , DOB: 1946/04/06 , age 76 y.o. , MRN: [id number] , ADDRESS: [place] [phone] [facility]CP [facility] Patient Care Team: [facility] as P[facility] - General  This Provider for this visit: Treatment Team:  Attending Provider: Brand Males, MD    01/27/2022 -   Chief Complaint  Patient presents with   Follow-up    Pt states he has been doing okay since last visit. Pt is still SOB but states it is about the same since last visit.   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  -QuantiFERON gold positive.  Seen Dr. Megan Salon 01/01/2022 and started on INH and rifampin x70-month HPI Luke [id number].[id number]. -returns for follow-up.  I last saw him a few months ago and then he  saw a Designer, jewellery.  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  INH and rifampin have been commenced.  After that he called our office or at least the wife called and reported that he was having shakes and trembles and frequent falls.  Also complaining of dyspnea on exertion.  Neither of them are able to tell me that if the symptoms are worse than before or of the onset of it started after the starting of INH and rifampin.  Definitely the call happened for the shakes and falls and the tremors after he got started on INH and rifampin.  But they are unable to explicitly tell me the duration.  In addition he was given ILD question in July 2023 by the nurse practitioner.  He was supposed to bring it with him today but I do not have it.  The wife believes that she fill it up and brought it and gave it to [place].  He does admit to previous street  drug use.  He also admits to previous heavy smoking.  He also admits to agent orange exposure for a year while he was in [place].  Of note the wife works at [employer].  She is now needing to care for him.  [facility] her to care for him.  But she needs a letter of support.  I did indicate to her about the FMLA option.  But she does want a caregiver support letter.   SYMPTOM SCALE - ILD 01/27/2022  Current weight   O2 use ra  Shortness of Breath 0 -> 5 scale with 5 being worst (score 6 If unable to do)  At rest 2  Simple tasks - showers, clothes change, eating, shaving 2  Household (dishes, doing bed, laundry) 3  Shopping 4.5  Walking level at own pace 4  Walking up Stairs 5  Total (30-36) Dyspnea Score 20.5  How bad is your cough? 2  How bad is your fatigue 4  How bad is nausea 0  How bad is vomiting?  0  How bad is diarrhea? 0  How bad is anxiety? 2  How bad is depression 4  Any chronic pain - if so where and how bad x       Simple office walk 185 feet x  3 laps goal with forehead probe 11/18/2021  01/27/2022   O2 used ra   Number laps completed 3   Comments about pace Mod pace   Resting Pulse Ox/HR 100%% and 67/min   Final Pulse Ox/HR 98% and 107/min   Desaturated </= 88% no   Desaturated <= 3% points no   Got Tachycardic >/= 90/min yes   Symptoms at end of test None   Miscellaneous comments x     PFT     Latest Ref Rng & Units 12/22/2021   11:50 AM  PFT Results  FVC-Pre L 3.80   FVC-Predicted Pre % 98   Pre FEV1/FVC % % 83   FEV1-Pre L 3.13   FEV1-Predicted Pre % 112   DLCO uncorrected ml/min/mmHg 17.19   DLCO UNC% % 73   DLCO corrected ml/min/mmHg 17.19   DLCO COR %Predicted % 73   DLVA Predicted % 81   TLC L 5.98   TLC % Predicted % 89   RV % Predicted % 91        has a past medical history  of Anxiety, Arthritis, Headache(784.0), Hypertension, Peripheral vascular disease ([place]), Pneumonia, Pneumothorax, and Shortness of  breath.   reports that he quit smoking about 25 years ago. His smoking use included cigarettes and cigars. He has a 48.00 pack-year smoking history. He has quit using smokeless tobacco.  His smokeless tobacco use included chew.  Past Surgical History:  Procedure Laterality Date   CARDIAC CATHETERIZATION     2012 - [place],medical therapy- as follow up    COLONOSCOPY W/ BIOPSIES AND POLYPECTOMY     Hx; of   EYE SURGERY     cataracts removed - /w IOL- bilateral    FRACTURE SURGERY     R elbow- 2000   JOINT REPLACEMENT Bilateral    TONSILLECTOMY     as an adult    TOTAL KNEE ARTHROPLASTY  12/23/2011   Procedure: TOTAL KNEE ARTHROPLASTY;  Surgeon: Ninetta Lights, MD;  Location: [place];  Service: Orthopedics;  Laterality: Left;   TOTAL KNEE ARTHROPLASTY Right 07/12/2013   Procedure: TOTAL KNEE ARTHROPLASTY;  Surgeon: Ninetta Lights, MD;  Location: [place];  Service: Orthopedics;  Laterality: Right;    Allergies  Allergen Reactions   Cephalosporins Hives   Tylenol [Acetaminophen] Other (See Comments)    Feel weird      Immunization History  Administered Date(s) Administered   DTaP 09/01/2013   Fluad Quad(high Dose 65+) 05/07/2020   Influenza, High Dose Seasonal PF 04/27/2016, 04/13/2017   Influenza-Unspecified 08/10/2012, 03/14/2014   Pfizer Covid-19 Vaccine Bivalent Booster 9yr & up 01/01/2022   Pneumococcal Conjugate-13 07/20/2014   Pneumococcal-Unspecified 03/22/2012   Td 12/26/2021   Tdap 03/22/2012   Zoster Recombinat (Shingrix) 12/29/2017, 03/17/2018    Family History  Problem Relation Age of Onset   Heart disease Father    Heart disease Brother      Current Outpatient Medications:    albuterol (VENTOLIN HFA) 108 (90 Base) MCG/ACT inhaler, Inhale 2 puffs into the lungs every 6 (six) hours as needed for wheezing or shortness of breath., Disp: 8 g, Rfl: 2   aspirin EC 81 MG tablet, Take 81 mg by mouth daily., Disp: , Rfl:    bisacodyl (DULCOLAX) 5 MG EC tablet,  Take 1 tablet (5 mg total) by mouth daily as needed for moderate constipation., Disp: 60 tablet, Rfl: 0   busPIRone (BUSPAR) 10 MG tablet, Take 15-30 mg by mouth See admin instructions. Take 15 mg by mouth in the morning and take 30 mg by mouth at bedtime, Disp: , Rfl:    docusate sodium (COLACE) 100 MG capsule, Take 300 mg by mouth daily as needed for mild constipation. , Disp: , Rfl:    isoniazid (NYDRAZID) 300 MG tablet, Take 1 tablet (300 mg total) by mouth daily., Disp: 30 tablet, Rfl: 2   lisinopril (PRINIVIL,ZESTRIL) 2.5 MG tablet, Take 2.5 mg by mouth at bedtime. , Disp: , Rfl:    memantine (NAMENDA) 10 MG tablet, TAKE ONE TABLET BY MOUTH AT BEDTIME TO SLOW MEMORY LOSS, Disp: , Rfl:    omeprazole (PRILOSEC) 40 MG capsule, TAKE ONE CAPSULE BY MOUTH TWICE A DAY FOR HEARTBURN (TAKE ON AN EMPTY STOMACH 30 MINUTES PRIOR TO A MEAL), Disp: , Rfl:    pyridOXINE (VITAMIN B6) 50 MG tablet, Take 1 tablet (50 mg total) by mouth daily., Disp: 30 tablet, Rfl: 2   rifampin (RIFADIN) 300 MG capsule, Take 2 capsules (600 mg total) by mouth daily., Disp: 60 capsule, Rfl: 2   rosuvastatin (CRESTOR) 20 MG tablet, Take  10 mg by mouth daily. , Disp: , Rfl:    sildenafil (VIAGRA) 100 MG tablet, TAKE ONE TABLET BY MOUTH AS DIRECTED (TAKE 1 HOUR PRIOR TO SEXUAL ACTIVITY *DO NOT EXCEED 1 DOSE PER 24 HOUR PERIOD*), Disp: , Rfl:    simethicone (MYLICON) 80 MG chewable tablet, CHEW TWO TABLETS BY MOUTH THREE TIMES A DAY AFTER MEALS AND AT BEDTIME AS NEEDED FOR GAS, Disp: , Rfl:    vitamin B-12 (CYANOCOBALAMIN) 500 MCG tablet, Take 2 tablets by mouth daily., Disp: , Rfl:       Objective:   Vitals:   01/27/22 1001  BP: 130/70  Pulse: 62  Temp: (!) 97.5 F (36.4 C)  TempSrc: Oral  SpO2: 97%  Weight: 216 lb 12.8 oz (98.3 kg)  Height: _0  (1.727 m)    Estimated body mass index is 32.96 kg/m as calculated from the following:   Height as of this encounter: _1  (1.727 m).   Weight as of this encounter: 216  lb 12.8 oz (98.3 kg).  _2 @  Filed Weights   01/27/22 1001  Weight: 216 lb 12.8 oz (98.3 kg)     Physical Exam    General: No distress. Looks same Neuro: Alert and Oriented x 3. GCS 15. Speech normal Psych: Pleasant but tangential Resp:  Barrel Chest - no.  Wheeze - no, Crackles - mild base, No overt respiratory distress CVS: Normal heart sounds. Murmurs - no Ext: Stigmata of Connective Tissue Disease - n HEENT: Normal upper airway. PEERL +. No post nasal drip        Assessment:       ICD-10-CM   1. IPF (idiopathic pulmonary fibrosis) (HCC)  J84.112 CBC with Differential/Platelet    Basic metabolic panel    Hepatic function panel    Pulmonary function test    Hepatic function panel    Basic metabolic panel    CBC with Differential/Platelet    Comp Met (CMET)    2. Agent orange exposure  Z77.098     3. Stopped smoking with greater than 40 pack year history  Z87.891     4. Caregiver burden  Z63.6     5. Positive QuantiFERON-TB Gold test  R76.12     6. Tremors of nervous system  R25.1     7. Frequent falls  R29.6     8. Shakes  R25.1          Plan:     Patient Instructions  IPF (idiopathic pulmonary fibrosis) (HCC) Agent orange exposure Stopped smoking with greater than 40 pack year history  - you have IPF that is a progressive disease  Plan  - antifibrotics indicated but given LAtent TB treatment going to hold off - do spiro/dlco in early nov 2023 - discuss antipfibrotics upon return - walk test upon return or 01/27/2022   Caregiver burden   - high caregiver role indicated  Plan  - letter for caregiver support done -also apply for FMLA  Positive QuantiFERON-TB Gold test  - on latent TB Rx since 8/3/23x  3 months  Plan  - per Dr Megan Salon of ID  Tremors of nervous system Frequent falls Shakes  - unclear if related to INH/Rifampin  Plan  - check cbc, bmet, lft 01/27/2022 - discuss with Dr Megan Salon ID doc  - if he does not  think is related to TB Rx -then see neurologist  Followup 30 min - visit Nov 2023 to discuss next steps  ( Level 05 visit:  Estb 40-54 min  visit type: on-site physical face to visit  in total care time and counseling or/and coordination of care by this undersigned MD - Dr Brand Males. This includes one or more of the following on this same day 01/27/2022: pre-charting, chart review, note writing, documentation discussion of test results, diagnostic or treatment recommendations, prognosis, risks and benefits of management options, instructions, education, compliance or risk-factor reduction. It excludes time spent by the [place] or office staff in the care of the patient. Actual time 40 min)   SIGNATURE    Dr. Brand Males, M.D., F.C.C.P,  Pulmonary and Critical Care Medicine Staff Physician, [place] Director - Interstitial Lung Disease  Program  Pulmonary [place] at [place], [place], [address]  Pager: [phone], If no answer or between  15:00h - 7:00h: call [phone] Telephone: [phone]  12:51 PM 01/27/2022

## 2022-01-27 NOTE — Patient Instructions (Addendum)
IPF (idiopathic pulmonary fibrosis) (HCC) Agent orange exposure Stopped smoking with greater than 40 pack year history  - you have IPF that is a progressive disease  Plan  - antifibrotics indicated but given LAtent TB treatment going to hold off - do spiro/dlco in early nov 2023 - discuss antipfibrotics upon return - walk test upon return or 01/27/2022   Caregiver burden   - high caregiver role indicated  Plan  - letter for caregiver support done -also apply for FMLA  Positive QuantiFERON-TB Gold test  - on latent TB Rx since 8/3/23x  3 months  Plan  - per Dr Orvan Falconer of ID  Tremors of nervous system Frequent falls Shakes  - unclear if related to INH/Rifampin  Plan  - check cbc, bmet, lft 01/27/2022 - discuss with Dr Orvan Falconer ID doc  - if he does not think is related to TB Rx -then see neurologist  Followup 30 min - visit Nov 2023 to discuss next steps

## 2022-01-28 ENCOUNTER — Telehealth: Payer: Self-pay | Admitting: Internal Medicine

## 2022-01-28 DIAGNOSIS — J84112 Idiopathic pulmonary fibrosis: Secondary | ICD-10-CM

## 2022-01-28 DIAGNOSIS — Z5181 Encounter for therapeutic drug level monitoring: Secondary | ICD-10-CM

## 2022-01-28 NOTE — Telephone Encounter (Signed)
   AST slightly high  Plan - rcheck LFT in 1-2 weeks (he is on INH, Rifampin)  Recent Labs  Lab 01/27/22 1044  AST 44*  44*  ALT 40  40  ALKPHOS 88  88  BILITOT 0.5  0.5  PROT 7.6  7.6  ALBUMIN 4.2  4.2

## 2022-01-29 ENCOUNTER — Ambulatory Visit (INDEPENDENT_AMBULATORY_CARE_PROVIDER_SITE_OTHER): Payer: No Typology Code available for payment source | Admitting: Internal Medicine

## 2022-01-29 ENCOUNTER — Encounter: Payer: Self-pay | Admitting: Internal Medicine

## 2022-01-29 ENCOUNTER — Other Ambulatory Visit: Payer: Self-pay

## 2022-01-29 DIAGNOSIS — F321 Major depressive disorder, single episode, moderate: Secondary | ICD-10-CM

## 2022-01-29 DIAGNOSIS — R7612 Nonspecific reaction to cell mediated immunity measurement of gamma interferon antigen response without active tuberculosis: Secondary | ICD-10-CM

## 2022-01-29 DIAGNOSIS — F32A Depression, unspecified: Secondary | ICD-10-CM | POA: Insufficient documentation

## 2022-01-29 DIAGNOSIS — R296 Repeated falls: Secondary | ICD-10-CM | POA: Diagnosis not present

## 2022-01-29 NOTE — Assessment & Plan Note (Signed)
He has had recurrent falls recently, most commonly in the setting of dizziness upon standing.  I encouraged him to be very careful when standing and to avoid his habit of standing quickly and starting off walking across the room.  I also asked [name] to check his blood pressure whenever he describes feeling dizzy and let me know what it is.

## 2022-01-29 NOTE — Assessment & Plan Note (Signed)
He mentions repeatedly that he is struggling with depression.  He is also having extreme fatigue which I believe is multifactorial related to his depression, recent weight gain and underlying lung disease.  He recently started seeing a psychiatrist at the [facility] but has not started any new medication.

## 2022-01-29 NOTE — Assessment & Plan Note (Signed)
He is 1 month into a planned 82-month course of therapy for latent tuberculosis.  Although they are concerned that his dizziness and trembling may be side effects of his medication I think that this is unlikely.  I have asked him to be very careful when first standing and to make sure that he is not dizzy and to try to avoid further falls.  I asked [name] to monitor his blood pressure and oxygen saturation at home when he is symptomatic.  They are in agreement with having him continue isoniazid and rifampin for now.  He has had a very slight increase in his AST but this is not an indication to stop therapy.  If he needs antifibrotic therapy for his interstitial pulmonary fibrosis it would be okay to start that at any time.  He will follow-up with me in 4 weeks.
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
## 2022-01-30 ENCOUNTER — Encounter: Payer: Self-pay | Admitting: Internal Medicine

## 2022-01-30 NOTE — Telephone Encounter (Signed)
Called and spoke with pt's spouse [name] letting her know the results of pt's bloodwork and that we needed to repeat labs in about 2 weeks. [name] verbalized understanding. Order placed. Nothing further needed.

## 2022-02-03 ENCOUNTER — Other Ambulatory Visit: Payer: Self-pay | Admitting: Internal Medicine

## 2022-02-03 DIAGNOSIS — R7612 Nonspecific reaction to cell mediated immunity measurement of gamma interferon antigen response without active tuberculosis: Secondary | ICD-10-CM

## 2022-02-04 ENCOUNTER — Telehealth: Payer: Self-pay | Admitting: Internal Medicine

## 2022-02-06 ENCOUNTER — Encounter: Payer: Self-pay | Admitting: Internal Medicine

## 2022-02-06 NOTE — Telephone Encounter (Signed)
Spoke with [name] who is requesting pt be allowed to have blood work completed at appointment with Dr. Orvan Falconer on 02/18/22. Dr. Marchelle Gearing would this be ok with you?

## 2022-02-06 NOTE — Telephone Encounter (Signed)
Talked to patient's wife Alvester Eads, message was sent in error. She was sending message to pcp.

## 2022-02-06 NOTE — Telephone Encounter (Signed)
See phone note from 9/6.

## 2022-02-08 NOTE — Telephone Encounter (Signed)
He is in drugs given by Dr Orvan Falconer - so that is fine to check LFT through Dr Campbell/at time of that visit but it cannot be postponed any further

## 2022-02-09 ENCOUNTER — Telehealth: Payer: Self-pay | Admitting: Internal Medicine

## 2022-02-09 NOTE — Telephone Encounter (Signed)
Called and spoke with Luke Golden letting her know info per [facility] and she verbalized understanding. Nothing further needed.

## 2022-02-09 NOTE — Telephone Encounter (Signed)
Only recommend antiviral if he tests positives. Recommend he avoid contact if all possible; stay in separate rooms, eat separately, try to have her quarantine to one room if possible for 5 days (first day of symptoms is day 0). He should monitor his symptoms and if he develops any allergy type/URI symptoms (nasal congestion, runny nose, headaches, sore throat), cough, fevers, body aches, to swab for COVID. He should call us if he does have a positive result. Thanks!

## 2022-02-09 NOTE — Telephone Encounter (Signed)
Katie, please advise on this if we could go ahead and send Rx to pharmacy for pt to have covid medication in case he starts developing any symptoms or what you recommend for both pt and spouse.

## 2022-02-09 NOTE — Telephone Encounter (Signed)
Called and spoke with pt letting her know the info per MR and he verbalized understanding. Nothing further needed.

## 2022-02-13 ENCOUNTER — Telehealth: Payer: Self-pay | Admitting: Internal Medicine

## 2022-02-13 MED ORDER — MOLNUPIRAVIR EUA 200MG CAPSULE: 4.0000 | ORAL_CAPSULE | Freq: Two times a day (BID) | ORAL | 0 refills | Status: AC

## 2022-02-13 NOTE — Telephone Encounter (Signed)
Any other recommendation other than ER is Oxygen gets below 90% as he is starting the Covid medication this evening?

## 2022-02-13 NOTE — Telephone Encounter (Signed)
Oxygen less than 90%, inability to eat or drink, any altered mental status.

## 2022-02-13 NOTE — Telephone Encounter (Signed)
He has significant drug interactions with Paxlovid  I have sent in a prescription for molnupiravir for 5 days to his pharmacy.  Please have him pick up and start today Monitor symptoms.  If symptom's worsen then he may need to go to the emergency room for evaluation.

## 2022-02-13 NOTE — Telephone Encounter (Signed)
Primary Pulmonologist: Ramaswamy Last office visit and with whom: 01/27/22 Ramaswamy What do we see them for (pulmonary problems): IPF Last OV assessment/plan:   Assessment:         ICD-10-CM    1. IPF (idiopathic pulmonary fibrosis) (HCC)  J84.112 CBC with Differential/Platelet      Basic metabolic panel      Hepatic function panel      Pulmonary function test      Hepatic function panel      Basic metabolic panel      CBC with Differential/Platelet      Comp Met (CMET)     2. Agent orange exposure  Z77.098       3. Stopped smoking with greater than 40 pack year history  Z87.891       4. Caregiver burden  Z63.6       5. Positive QuantiFERON-TB Gold test  R76.12       6. Tremors of nervous system  R25.1       7. Frequent falls  R29.6       8. Shakes  R25.1              Plan:     Patient Instructions  IPF (idiopathic pulmonary fibrosis) (HCC) Agent orange exposure Stopped smoking with greater than 40 pack year history   - you have IPF that is a progressive disease   Plan  - antifibrotics indicated but given LAtent TB treatment going to hold off - do spiro/dlco in early nov 2023 - discuss antipfibrotics upon return - walk test upon return or 01/27/2022     Caregiver burden    - high caregiver role indicated   Plan  - letter for caregiver support done -also apply for FMLA   Positive QuantiFERON-TB Gold test   - on latent TB Rx since 8/3/23x  3 months   Plan  - per Dr Megan Salon of ID   Tremors of nervous system Frequent falls Shakes   - unclear if related to INH/Rifampin   Plan  - check cbc, bmet, lft 01/27/2022 - discuss with Dr Megan Salon ID doc  - if he does not think is related to TB Rx -then see neurologist   Followup 30 min - visit Nov 2023 to discuss next steps   ( Level 05 visit: Estb 40-54 min  visit type: on-site physical face to visit  in total care time and counseling or/and coordination of care by this undersigned MD - Dr Brand Males. This includes one or more of the following on this same day 01/27/2022: pre-charting, chart review, note writing, documentation discussion of test results, diagnostic or treatment recommendations, prognosis, risks and benefits of management options, instructions, education, compliance or risk-factor reduction. It excludes time spent by the [place] or office staff in the care of the patient. Actual time 40 min)     SIGNATURE      Dr. Brand Males, M.D., F.C.C.P,  Pulmonary and Critical Care Medicine Staff Physician, [place] Director - Interstitial Lung Disease  Program  P[facility] [place] [facility] [place], [place], [address]   Pager: [phone], If no answer or between  15:00h - 7:00h: call [phone] Telephone: [phone]   12:51 PM 01/27/2022        Patient Instructions by Brand Males, MD at 01/27/2022 10:00 AM  Author: Brand Males, MD Author Type: Physician Filed: 01/27/2022 10:37 AM  Note Status:  Addendum CosignMickle Mallory Not Required Encounter Date: 01/27/2022  Editor: Brand Males, MD (Physician)      Prior Versions: 1. Brand Males, MD (Physician) at 01/27/2022 10:31 AM - Addendum   2. Brand Males, MD (Physician) at 01/27/2022  8:57 AM - Addendum   3. Brand Males, MD (Physician) at 01/27/2022  8:55 AM - Signed    IPF (idiopathic pulmonary fibrosis) ([place]) Agent orange exposure Stopped smoking with greater than 40 pack year history   - you have IPF that is a progressive disease   Plan  - antifibrotics indicated but given LAtent TB treatment going to hold off - do spiro/dlco in early nov 2023 - discuss antipfibrotics upon return - walk test upon return or 01/27/2022     Caregiver burden    - high caregiver role indicated   Plan  - letter for caregiver support done -also apply for FMLA   Positive QuantiFERON-TB Gold test   - on latent TB Rx since 8/3/23x  3  months   Plan  - per Dr Megan Salon of ID   Tremors of nervous system Frequent falls Shakes   - unclear if related to INH/Rifampin   Plan  - check cbc, bmet, lft 01/27/2022 - discuss with Dr Megan Salon ID doc  - if he does not think is related to TB Rx -then see neurologist   Followup 30 min - visit Nov 2023 to discuss next steps       Orthostatic Vitals Recorded in This Encounter   01/27/2022  1001     Patient Position: Sitting  BP Location: Left Arm  Cuff Size: Normal   Instructions  IPF (idiopathic pulmonary fibrosis) (HCC) Agent orange exposure Stopped smoking with greater than 40 pack year history   - you have IPF that is a progressive disease   Plan  - antifibrotics indicated but given LAtent TB treatment going to hold off - do spiro/dlco in early nov 2023 - discuss antipfibrotics upon return - walk test upon return or 01/27/2022     Caregiver burden    - high caregiver role indicated   Plan  - letter for caregiver support done -also apply for FMLA   Positive QuantiFERON-TB Gold test   - on latent TB Rx since 8/3/23x  3 months   Plan  - per Dr Megan Salon of ID   Tremors of nervous system Frequent falls Shakes   - unclear if related to INH/Rifampin   Plan  - check cbc, bmet, lft 01/27/2022 - discuss with Dr Megan Salon ID doc  - if he does not think is related to TB Rx -then see neurologist   Followup 30 min - visit Nov 2023 to discuss next steps      Was appointment offered to patient (explain)?  No, Covid positive   Reason for call: Patient's wife called stating that the patient tested positive for covid today, has been having symptoms for 3 days.  His wife tested positive on Monday.  She says he is having neurological symptoms, shuffling when he walks and disoriented.  He is walking like a robot.  He told her he had fallen 3 times.  She denies him having any sob more than normal, not having to use his albuterol inhaler.  No fever, chills or body  aches.  He is cold.  He is not wearing his CPAP like he is supposed to.  He says he does not feel well, decreased appetite.  Denies any nausea, vomiting or diarrhea.  Advised I  would get a message to a provider and once we hear back we will call her back.  She says the medication needs to be sent to [facility] on [address].  Dr. Vaughan Browner, please advise.  Thank you.  (examples of things to ask: : When did symptoms start? Fever? Cough? Productive? Color to sputum? More sputum than usual? Wheezing? Have you needed increased oxygen? Are you taking your respiratory medications? What over the counter measures have you tried?)  Allergies  Allergen Reactions   Cephalosporins Hives   Tylenol [Acetaminophen] Other (See Comments)    Feel weird      Immunization History  Administered Date(s) Administered   DTaP 09/01/2013   Fluad Quad(high Dose 65+) 05/07/2020   Influenza, High Dose Seasonal PF 04/27/2016, 04/13/2017   Influenza-Unspecified 08/10/2012, 03/14/2014   Pfizer Covid-19 Vaccine Bivalent Booster 67yrs & up 01/01/2022   Pneumococcal Conjugate-13 07/20/2014   Pneumococcal-Unspecified 03/22/2012   Td 12/26/2021   Tdap 03/22/2012   Zoster Recombinat (Shingrix) 12/29/2017, 03/17/2018

## 2022-02-13 NOTE — Telephone Encounter (Signed)
He just tested positive for Covid on 02/13/2022, he has some cough that she thinks it productive. They have the medication and his oxygen is running around 93% at this time.   He is taking a blood pressure 132/49.   He is not drinking much and the daughter has Covid as well. The pharmacy is going to deliver the medication this evening. Pleae advise of any other recommendations? She wants to know when is a time for them to go to the ER if symptoms are not better over the weekend?

## 2022-02-13 NOTE — Telephone Encounter (Signed)
Spoke with pt's wife and notified medication had been sent in. Wife stated understanding. Nothing further needed at this time.

## 2022-02-15 ENCOUNTER — Encounter: Payer: Self-pay | Admitting: Internal Medicine

## 2022-02-16 ENCOUNTER — Encounter: Payer: Self-pay | Admitting: Internal Medicine

## 2022-02-17 ENCOUNTER — Encounter: Payer: Self-pay | Admitting: Internal Medicine

## 2022-02-17 NOTE — Telephone Encounter (Signed)
I called and spoke with the spouse and they have made a virtual follow up with the PCP. Nothing further needed.

## 2022-02-18 ENCOUNTER — Other Ambulatory Visit: Payer: Self-pay

## 2022-02-18 ENCOUNTER — Encounter: Payer: Self-pay | Admitting: Internal Medicine

## 2022-02-18 ENCOUNTER — Telehealth (INDEPENDENT_AMBULATORY_CARE_PROVIDER_SITE_OTHER): Payer: No Typology Code available for payment source | Admitting: Internal Medicine

## 2022-02-18 DIAGNOSIS — U071 COVID-19: Secondary | ICD-10-CM | POA: Diagnosis not present

## 2022-02-18 NOTE — Progress Notes (Signed)
Virtual Visit via Video Note  I connected with Luke Golden on 02/18/22 at  9:30 AM EDT by a video enabled telemedicine application and verified that I am speaking with the correct person using two identifiers.  Location: Patient: Home Provider: [facility]   I discussed the limitations of evaluation and management by telemedicine and the availability of in person appointments. The patient expressed understanding and agreed to proceed.  History of Present Illness: I conducted a video visit with Luke Golden and his wife, Luke Golden.  He continues on isoniazid and rifampin for treatment of latent tuberculosis.  He started therapy on 01/01/2022.  He had mild elevation of his AST to 44 on his last set of lab work.  He received his only COVID-vaccine in early August.  Luke Golden has never had a COVID-vaccine and developed symptomatic COVID about 10 days ago.  Dad began to have fever and severe cough and was diagnosed with COVID and started on therapy with molnupiravir 02/13/2022.  His fever has resolved but he still has severe cough productive of white sputum and extreme fatigue.  His oxygen saturations has remained 95% or above.   Observations/Objective:   Assessment and Plan: I expect him to have slow recovery from COVID.  I told him that if been vaccinated he very likely would have required hospitalization.  He will complete therapy with molnupiravir.  I am not worried about very mild AST elevation.  He will continue isoniazid and rifampin and complete 3 months of therapy.  Follow Up Instructions: Follow-up here in 2 weeks   I discussed the assessment and treatment plan with the patient. The patient was provided an opportunity to ask questions and all were answered. The patient agreed with the plan and demonstrated an understanding of the instructions.   The patient was advised to call back or seek an in-person evaluation if the symptoms worsen or if the condition fails to improve as anticipated.  I provided 18  minutes of non-face-to-face time during this encounter.   Michel Bickers, MD

## 2022-02-19 ENCOUNTER — Encounter: Payer: Self-pay | Admitting: Internal Medicine

## 2022-02-24 ENCOUNTER — Encounter: Payer: Self-pay | Admitting: Internal Medicine

## 2022-02-25 ENCOUNTER — Encounter: Payer: Self-pay | Admitting: Internal Medicine

## 2022-03-04 ENCOUNTER — Other Ambulatory Visit: Payer: Self-pay

## 2022-03-04 ENCOUNTER — Ambulatory Visit (INDEPENDENT_AMBULATORY_CARE_PROVIDER_SITE_OTHER): Payer: No Typology Code available for payment source | Admitting: Internal Medicine

## 2022-03-04 ENCOUNTER — Ambulatory Visit: Payer: Non-veteran care | Admitting: Internal Medicine

## 2022-03-04 DIAGNOSIS — U071 COVID-19: Secondary | ICD-10-CM | POA: Diagnosis not present

## 2022-03-04 DIAGNOSIS — R7612 Nonspecific reaction to cell mediated immunity measurement of gamma interferon antigen response without active tuberculosis: Secondary | ICD-10-CM | POA: Diagnosis not present

## 2022-03-04 DIAGNOSIS — J84112 Idiopathic pulmonary fibrosis: Secondary | ICD-10-CM

## 2022-03-04 NOTE — Assessment & Plan Note (Signed)
He is recovering slowly but uneventfully from his recent COVID infection.
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
## 2022-03-04 NOTE — Assessment & Plan Note (Signed)
Who has been on isoniazid and rifampin for over 2 months it is safe to start therapy for underlying pulmonary fibrosis if needed.

## 2022-03-04 NOTE — Assessment & Plan Note (Signed)
He will continue INH and rifampin for 23 more days to complete 3 months of total therapy.  There is no utility in repeating his QuantiFERON gold TB assay as it will always be positive.  If he needs to start immunomodulatory therapy for his underlying pulmonary fibrosis it is safe to do that at any time.  He will follow-up with me in 4 weeks.  I will repeat his liver enzymes today.

## 2022-03-05 ENCOUNTER — Encounter: Payer: Self-pay | Admitting: Internal Medicine

## 2022-03-05 LAB — COMPREHENSIVE METABOLIC PANEL
AG Ratio: 1.5 (calc) (ref 1.0–2.5)
ALT: 123 U/L — ABNORMAL HIGH (ref 9–46)
AST: 64 U/L — ABNORMAL HIGH (ref 10–35)
Albumin: 4.2 g/dL (ref 3.6–5.1)
Alkaline phosphatase (APISO): 90 U/L (ref 35–144)
BUN: 13 mg/dL (ref 7–25)
CO2: 29 mmol/L (ref 20–32)
Calcium: 9.6 mg/dL (ref 8.6–10.3)
Chloride: 104 mmol/L (ref 98–110)
Creat: 0.95 mg/dL (ref 0.70–1.28)
Globulin: 2.8 g/dL (calc) (ref 1.9–3.7)
Glucose, Bld: 112 mg/dL — ABNORMAL HIGH (ref 65–99)
Potassium: 4.5 mmol/L (ref 3.5–5.3)
Sodium: 139 mmol/L (ref 135–146)
Total Bilirubin: 0.4 mg/dL (ref 0.2–1.2)
Total Protein: 7 g/dL (ref 6.1–8.1)

## 2022-04-02 ENCOUNTER — Other Ambulatory Visit: Payer: Self-pay

## 2022-04-02 ENCOUNTER — Encounter: Payer: Self-pay | Admitting: Internal Medicine

## 2022-04-02 ENCOUNTER — Ambulatory Visit (INDEPENDENT_AMBULATORY_CARE_PROVIDER_SITE_OTHER): Payer: No Typology Code available for payment source | Admitting: Internal Medicine

## 2022-04-02 DIAGNOSIS — R7612 Nonspecific reaction to cell mediated immunity measurement of gamma interferon antigen response without active tuberculosis: Secondary | ICD-10-CM | POA: Diagnosis not present

## 2022-04-02 NOTE — Progress Notes (Signed)
Virtual Visit via Video Note  I connected with Luke Golden on 04/02/22 at 11:30 AM EDT by a video enabled telemedicine application and verified that I am speaking with the correct person using two identifiers.  Location: Patient: Home Provider: [facility]   I discussed the limitations of evaluation and management by telemedicine and the availability of in person appointments. The patient expressed understanding and agreed to proceed.  History of Present Illness: I called and spoke with Luke Golden today.  His wife, Luke Golden, was able to join the call from work.  He had completed 3 months of isoniazid and rifampin last week.  He says that he is feeling much better.  He has much more energy and less shortness of breath.  He says that he can now walk to the mailbox without any difficulty.  He is scheduled to follow-up with Dr. Chase Caller, his pulmonologist, to determine if he needs to start therapy for his recently diagnosed pulmonary fibrosis.   Observations/Objective: CMP     Component Value Date/Time   NA 139 03/04/2022 0907   K 4.5 03/04/2022 0907   CL 104 03/04/2022 0907   CO2 29 03/04/2022 0907   GLUCOSE 112 (H) 03/04/2022 0907   BUN 13 03/04/2022 0907   CREATININE 0.95 03/04/2022 0907   CALCIUM 9.6 03/04/2022 0907   PROT 7.0 03/04/2022 0907   ALBUMIN 4.2 01/27/2022 1044   ALBUMIN 4.2 01/27/2022 1044   AST 64 (H) 03/04/2022 0907   ALT 123 (H) 03/04/2022 0907   ALKPHOS 88 01/27/2022 1044   ALKPHOS 88 01/27/2022 1044   BILITOT 0.4 03/04/2022 0907   GFRNONAA >60 01/16/2018 0932   GFRAA >60 01/16/2018 0932    Assessment and Plan: He had has completed therapy for latent tuberculosis.  He did have some mild hepatic transaminase elevation while on isoniazid and rifampin.  This usually reverts back to normal quickly after stopping therapy.  Now that he has completed therapy there is no reason not to start treatments for his pulmonary fibrosis if Dr. Chase Caller feels that is indicated.  I can tell  from his voice that he is feeling much better.  I think that is primary due to the fact that he is recovering from recent COVID infection but also partially due to being off of his isoniazid and rifampin.  Follow Up Instructions: Follow-up here as needed   I discussed the assessment and treatment plan with the patient. The patient was provided an opportunity to ask questions and all were answered. The patient agreed with the plan and demonstrated an understanding of the instructions.   The patient was advised to call back or seek an in-person evaluation if the symptoms worsen or if the condition fails to improve as anticipated.  I provided 16 minutes of non-face-to-face time during this encounter.   Michel Bickers, MD

## 2022-04-05 ENCOUNTER — Encounter: Payer: Self-pay | Admitting: Internal Medicine

## 2022-04-21 ENCOUNTER — Ambulatory Visit (INDEPENDENT_AMBULATORY_CARE_PROVIDER_SITE_OTHER): Payer: No Typology Code available for payment source | Admitting: Internal Medicine

## 2022-04-21 ENCOUNTER — Encounter: Payer: Self-pay | Admitting: Internal Medicine

## 2022-04-21 VITALS — BP 130/70 | HR 65 | Ht 68.0 in | Wt 217.2 lb

## 2022-04-21 DIAGNOSIS — Z5181 Encounter for therapeutic drug level monitoring: Secondary | ICD-10-CM

## 2022-04-21 DIAGNOSIS — Z87891 Personal history of nicotine dependence: Secondary | ICD-10-CM

## 2022-04-21 DIAGNOSIS — Z77098 Contact with and (suspected) exposure to other hazardous, chiefly nonmedicinal, chemicals: Secondary | ICD-10-CM

## 2022-04-21 DIAGNOSIS — J84112 Idiopathic pulmonary fibrosis: Secondary | ICD-10-CM

## 2022-04-21 LAB — HEPATIC FUNCTION PANEL
ALT: 23 U/L (ref 0–53)
AST: 35 U/L (ref 0–37)
Albumin: 4.6 g/dL (ref 3.5–5.2)
Alkaline Phosphatase: 79 U/L (ref 39–117)
Bilirubin, Direct: 0.1 mg/dL (ref 0.0–0.3)
Total Bilirubin: 0.5 mg/dL (ref 0.2–1.2)
Total Protein: 7.9 g/dL (ref 6.0–8.3)

## 2022-04-21 LAB — PULMONARY FUNCTION TEST
DL/VA % pred: 80 %
DL/VA: 3.21 ml/min/mmHg/L
DLCO cor % pred: 76 %
DLCO cor: 18.04 ml/min/mmHg
DLCO unc % pred: 76 %
DLCO unc: 18.04 ml/min/mmHg
FEF 25-75 Pre: 3.31 L/sec
FEF2575-%Pred-Pre: 166 %
FEV1-%Pred-Pre: 112 %
FEV1-Pre: 3.11 L
FEV1FVC-%Pred-Pre: 115 %
FEV6-%Pred-Pre: 103 %
FEV6-Pre: 3.73 L
FEV6FVC-%Pred-Pre: 107 %
FVC-%Pred-Pre: 96 %
FVC-Pre: 3.73 L
Pre FEV1/FVC ratio: 83 %
Pre FEV6/FVC Ratio: 100 %

## 2022-04-21 NOTE — Progress Notes (Signed)
Spirometry and Dlco done today. 

## 2022-04-21 NOTE — Patient Instructions (Addendum)
IPF (idiopathic pulmonary fibrosis) (HCC) Agent orange exposure Stopped smoking with greater than 40 pack year history  - you have IPF that is a progressive disease - recently PFT stable - LFT elevated OCt 2023  Plan  - antifibrotics indicated - detailed discussion - start esbriet per protocol provided LFT fine - check LFT 04/21/2022 - no clinical trials for now as discussed - avoid exposures at home  - smoke  - mold  - feather pillows, jackets, softs, blankets  - sick people = take article on agent orange exposure and IPF   Positive QuantiFERON-TB Gold test s/p  - latent TB Rx since 8/3/23x  3 months  Plan  -monitior  Followup 6 weks to see APP to discuss esbreit uptake 12 weeks do spirometry and dlco 12 weeks DR Marchelle Gearing for 30 min visit to see progres

## 2022-04-21 NOTE — Progress Notes (Signed)
OV 11/18/2021  Subjective:  Patient ID: Luke Golden, male , DOB: [birth year] , age 76 y.o. , MRN: [id number] , ADDRESS: [address] [place] [address] PCP [facility] Patient Care Team: [facility] as PCP - General  This Provider for this visit: Treatment Team:  Attending Provider: Kalman Shan, MD PCP Dr Jess Barters at [facility]   11/18/2021 -   Chief Complaint  Patient presents with   Consult    Pt recently had a PFT performed and is here today to go over the results. Pt does have complaints of an occasional cough and SOB that is worse with exertion.     HPI ERIQ Golden 76 y.o. -referred by the [facility].  History is obtained from talking to him and his wife.  They live in [place].  Wife works as a Psychologist, occupational at [employer].  He is asked weight no more veteran.  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  Denies any agent orange exposure.  Former smoker.  According to the wife for the last few years he has had exertional fatigue and also shortness of breath.  But insidiously and gradually is gotten worse.  Sometime towards the end of 2022 at the beginning of 2023 he did have a viral syndrome [facility].  Then in the visit with the [facility] in April 2023 it appears that he started complaining of exertional fatigue and shortness of breath.  Was fairly significant.  They did a chest x-ray on him on September 12, 2021.  Only have the report.  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  He has old chest x-ray here that I personally visualized shows some chronic atelectatic changes.  They did a pulmonary function test and it shows normal FEV1 FVC TLC but isolated reduction in diffusion capacity to 64.2% overall wife says that he is got a lot of fatigue dyspnea lower stamina.  Things are getting worse.  His past medical history is significant for  short-term memory loss, left femoral DVT in 2000.  Denies any substance abuse or tobacco abuse alcohol use in the last 20 years.  He has obstructive sleep apnea moderate but he has quit using his CPAP in the last week.  This is because of facial fit issues.  He also has back pain PTSD coronary artery disease hyperlipidemia acid reflux hypertension.   According to him he had a CT scan of the chest but we could not find evidence for this.  According to my CMA has had a echocardiogram at the [place] but I could not find evidence for this.  He had a good walking desaturation test here.     CT Chest data - Nov 2015  CLINICAL DATA:  Chest pain.   EXAM: PORTABLE CHEST - 1 VIEW   COMPARISON:  July 05, 2013.   FINDINGS: Stable cardiomediastinal silhouette. No pneumothorax or pleural effusion is noted. Minimal bibasilar subsegmental atelectasis is noted. Old right rib fractures are noted.   IMPRESSION: Minimal bibasilar subsegmental atelectasis.     Electronically Signed   By: Roque Lias M.D.   On: 04/23/2014 12:29  No results found.  Nuclear Medicine Stress tst 2017  IMPRESSION: 1. No scintigraphic evidence of prior infarction or pharmacologically induced ischemia.   2. Normal left ventricular wall motion.   3. Left ventricular ejection fraction  59%   4. Non invasive risk stratification*: Low   *2012 Appropriate Use Criteria for Coronary Revascularization Focused Update: J Am Coll Cardiol. 2012;59(9):857-881. http://content.dementiazones.comonlinejacc.org/article.aspx?articleid=1201161     Electronically Signed   By: Simonne ComeJohn  Watts M.D.   On: 01/28/2016 13:07       12/22/2021: Today-follow-up Patient presents today for follow-up after undergoing high-resolution CT scan, ILD panel and pulmonary function testing.  Pulmonary function testing was completed today and showed normal spirometry and lung volumes.  He did have a mild reduction in DLCO at 73%.  He was unable to complete  postbronchodilator spirometry.  ILD panel was negative aside from low positive ANA and QuantiFERON gold.  No active evidence of TB on recent imaging.  He has never been told in the past that he has TB or has been exposed to it.  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  Today, he reports feeling unchanged when compared to when he was here last.  Breathing is overall stable.  He does get winded, primarily with climbing and long distances.  He also has ongoing issues with fatigue; however, he had stopped using his CPAP, which could be a large contributing factor.  He has since restarted and feels like he has gotten more restful sleep and wakes up feeling better in the morning.  He denies any recent fevers, night sweats, hemoptysis, weight loss, anorexia, lower extremity edema, orthopnea. Denies joint pain, dry mouth or eyes. He has yet to complete ONO and echocardiogram.  Never tried any inhalers for his shortness of breath.   IMPRESSION: 1. Findings are indicative of interstitial lung disease, clearly progressive compared to remote prior study from 2012, with a spectrum of findings considered probable usual interstitial pneumonia (UIP) per current ATS guidelines. Repeat high-resolution chest CT is suggested in 12 months to assess for temporal changes in the appearance of the lung parenchyma. 2. Aortic atherosclerosis, in addition to left main and three-vessel coronary artery disease. Assessment for potential risk factor modification, dietary therapy or pharmacologic therapy may be warranted, if clinically indicated. 3. There are calcifications of the aortic valve and mitral annulus. Echocardiographic correlation for evaluation of potential valvular dysfunction may be warranted if clinically indicated. 4. Cholelithiasis.   Aortic Atherosclerosis (ICD10-I70.0).     Electronically Signed   By: Trudie Reedaniel  Entrikin M.D.    On: 12/09/2021 09:38  OV 01/27/2022  Subjective:  Patient ID: Luke Golden, male , DOB: [birth year] , age 76 y.o. , MRN: [id number] , ADDRESS: [address] [place] [id number] [facility]  This Provider for this visit: Treatment Team:  Attending Provider: Kalman Shanamaswamy, Adeli Frost, MD    01/27/2022 -   Chief Complaint  Patient presents with   Follow-up    Pt states he has been doing okay since last visit. Pt is still SOB but states it is about the same since last visit.    HPI Luke Maladydwin A Asencio 76 y.o. -returns for follow-up.  I last saw him a few months ago and then he saw a Publishing rights managernurse practitioner.  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  INH and rifampin have been commenced.  After that he called our office or at least the wife called and reported that he was  having shakes and trembles and frequent falls.  Also complaining of dyspnea on exertion.  Neither of them are able to tell me that if the symptoms are worse than before or of the onset of it started after the starting of INH and rifampin.  Definitely the call happened for the shakes and falls and the tremors after he got started on INH and rifampin.  But they are unable to explicitly tell me the duration.  In addition he was given ILD question in July 2023 by the nurse practitioner.  He was supposed to bring it with him today but I do not have it.  The wife believes that she fill it up and brought it and gave it to [place].  He does admit to previous street drug use.  He also admits to previous heavy smoking.  He also admits to agent orange exposure for a year while he was in [place].  Of note the wife works at [employer].  She is now needing to care for him.  [facility] will [facility] for her to care for him.  But she needs a letter of support.  I did indicate to her about the FMLA  option.  But she does want a caregiver support letter.      OV 04/21/2022  Subjective:  Patient ID: Luke Golden, male , DOB: Feb 05, 1946 , age 16 y.o. , MRN: [id number] , ADDRESS: [address] [place] [address] PCP [facility] Patient Care Team: [facility] as PCP - General  This Provider for this visit: Treatment Team:  Attending Provider: Kalman Shan, MD    04/21/2022 -   Chief Complaint  Patient presents with   Follow-up    PFT performed today.  Pt finished taking the TB medication about 1 month ago. Still coughing up phlegm in the mornings that is clear in color. States that he does have complaints of SOB.    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  -QuantiFERON gold positive.  Seen Dr. Orvan Falconer 01/01/2022 and started on INH and rifampin x42-month  -Short-term memory loss and wife with significant caregiver burden  HPI TYCE DELCID 76 y.o. -returns for follow-up.  He is here to discuss his pulmonary function test and symptoms symptoms are stable.  Pulmonary function test is stable.  He has completed his INH treatment.  Gave him and his wife diagnosis of IPF based on above criteria.  This was reminded and read discussion because the short-term memory loss.  In the interim he did get COVID-19 in September 2023 and was on Paxlovid.  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  There are no new issues.   We discussed several aspects of his care plan.  We discussed clinical trials but given his short-term memory loss and inability to consent and his lack of interest in "being a [place] pig" they are not interested in that at this point in time.  They might revisit this in the future.  Discussed pirfenidone and nintedanib.  He does not  want to do nintedanib because of the diarrhea side effect.  He feels he is overweight and would rather go with pirfenidone which would give him some nausea and anorexia and possible weight loss. Discussed the fact is 3 pills 3 times daily and just apply sunscreen  and he is to space this out 5 to 6 hours apart.  His wife made notes about all this.  Liver function test monitoring is required.  They decided to go with pirfenidone.       SYMPTOM SCALE - ILD 01/27/2022 04/21/2022   Current weight    O2 use ra ra  Shortness of Breath 0 -> 5 scale with 5 being worst (score 6 If unable to do)   At rest 2 2  Simple tasks - showers, clothes change, eating, shaving 2 1  Household (dishes, doing bed, laundry) 3 2  Shopping 4.5 2  Walking level at own pace 4 1  Walking up Stairs 5 2  Total (30-36) Dyspnea Score 20.5 11  How bad is your cough? 2 2  How bad is your fatigue 4 intermitten  How bad is nausea 0 0  How bad is vomiting?  0 0  How bad is diarrhea? 0 0  How bad is anxiety? 2 3  How bad is depression 4 3  Any chronic pain - if so where and how bad x x       Simple office walk 185 feet x  3 laps goal with forehead probe 11/18/2021  04/21/2022   O2 used ra ra  Number laps completed 3 3  Comments about pace Mod pace mod  Resting Pulse Ox/HR 100%% and 67/min 100% and 65  Final Pulse Ox/HR 98% and 107/min 99% and HR 98  Desaturated </= 88% no no  Desaturated <= 3% points no no  Got Tachycardic >/= 90/min yes yes  Symptoms at end of test None none  Miscellaneous comments x     PFT     Latest Ref Rng & Units 04/21/2022    8:41 AM 12/22/2021   11:50 AM  PFT Results  FVC-Pre L 3.73  P 3.80   FVC-Predicted Pre % 96  P 98   Pre FEV1/FVC % % 83  P 83   FEV1-Pre L 3.11  P 3.13   FEV1-Predicted Pre % 112  P 112   DLCO uncorrected ml/min/mmHg 18.04  P 17.19   DLCO UNC% % 76  P 73   DLCO corrected ml/min/mmHg 18.04  P 17.19   DLCO COR %Predicted % 76  P 73   DLVA Predicted % 80   P 81   TLC L  5.98   TLC % Predicted %  89   RV % Predicted %  91     P Preliminary result       has a past medical history of Anxiety, Arthritis, Headache(784.0), Hypertension, Peripheral vascular disease (HCC), Pneumonia, Pneumothorax, and Shortness of breath.   reports that he quit smoking about 25 years ago. His smoking use included cigarettes and cigars. He has a 48.00 pack-year smoking history. He has quit using smokeless tobacco.  His smokeless tobacco use included chew.  Past Surgical History:  Procedure Laterality Date   CARDIAC CATHETERIZATION     2012 - [place],medical therapy- as follow up    COLONOSCOPY W/ BIOPSIES AND POLYPECTOMY     Hx; of   EYE SURGERY     cataracts removed - /w IOL- bilateral    FRACTURE SURGERY     R elbow- 2000   JOINT REPLACEMENT Bilateral    TONSILLECTOMY     as an adult    TOTAL KNEE ARTHROPLASTY  12/23/2011   Procedure: TOTAL KNEE ARTHROPLASTY;  Surgeon: Loreta Ave, MD;  Location: [facility] OR;  Service: Orthopedics;  Laterality: Left;   TOTAL KNEE ARTHROPLASTY Right 07/12/2013   Procedure: TOTAL KNEE ARTHROPLASTY;  Surgeon: Loreta Ave, MD;  Location[facility] OR;  Service: Orthopedics;  Laterality: Right;    Allergies  Allergen Reactions   Cephalosporins Hives   Tylenol [Acetaminophen] Other (See Comments)    Feel weird      Immunization History  Administered Date(s) Administered   DTaP 09/01/2013   Fluad Quad(high Dose 65+) 05/07/2020   Influenza, High Dose Seasonal PF 04/27/2016, 04/13/2017, 04/03/2022   Influenza-Unspecified 08/10/2012, 03/14/2014   Pfizer Covid-19 Vaccine Bivalent Booster 59yrs & up 01/01/2022   Pneumococcal Conjugate-13 07/20/2014   Pneumococcal-Unspecified 03/22/2012   Td 12/26/2021   Tdap 03/22/2012   Zoster Recombinat (Shingrix) 12/29/2017, 03/17/2018    Family History  Problem Relation Age of Onset   Heart disease Father    Heart disease Brother      Current Outpatient Medications:     albuterol (VENTOLIN HFA) 108 (90 Base) MCG/ACT inhaler, Inhale 2 puffs into the lungs every 6 (six) hours as needed for wheezing or shortness of breath., Disp: 8 g, Rfl: 2   aspirin EC 81 MG tablet, Take 81 mg by mouth daily., Disp: , Rfl:    busPIRone (BUSPAR) 10 MG tablet, Take 15-30 mg by mouth See admin instructions. Take 15 mg by mouth in the morning and take 30 mg by mouth at bedtime, Disp: , Rfl:    lisinopril (PRINIVIL,ZESTRIL) 2.5 MG tablet, Take 2.5 mg by mouth at bedtime. , Disp: , Rfl:    memantine (NAMENDA) 10 MG tablet, TAKE ONE TABLET BY MOUTH AT BEDTIME TO SLOW MEMORY LOSS, Disp: , Rfl:    omeprazole (PRILOSEC) 40 MG capsule, TAKE ONE CAPSULE BY MOUTH TWICE A DAY FOR HEARTBURN (TAKE ON AN EMPTY STOMACH 30 MINUTES PRIOR TO A MEAL), Disp: , Rfl:    rosuvastatin (CRESTOR) 20 MG tablet, Take 10 mg by mouth daily. , Disp: , Rfl:    sildenafil (VIAGRA) 100 MG tablet, TAKE ONE TABLET BY MOUTH AS DIRECTED (TAKE 1 HOUR PRIOR TO SEXUAL ACTIVITY *DO NOT EXCEED 1 DOSE PER 24 HOUR PERIOD*), Disp: , Rfl:    simethicone (MYLICON) 80 MG chewable tablet, CHEW TWO TABLETS BY MOUTH THREE TIMES A DAY AFTER MEALS AND AT BEDTIME AS NEEDED FOR GAS, Disp: , Rfl:    vitamin B-12 (CYANOCOBALAMIN) 500 MCG tablet, Take 2 tablets by mouth daily. (Patient not taking: Reported on 04/21/2022), Disp: , Rfl:       Objective:   Vitals:   04/21/22 0953  BP: 130/70  Pulse: 65  SpO2: 100%  Weight: 217 lb 3.2 oz (98.5 kg)  Height: 5\' 8"  (1.727 m)    Estimated body mass index is 33.03 kg/m as calculated from the following:   Height as of this encounter: 5\' 8"  (1.727 m).   Weight as of this encounter: 217 lb 3.2 oz (98.5 kg).  @WEIGHTCHANGE @    04/21/22 0953  Weight: 217 lb 3.2 oz (98.5 kg)     Physical Exam General: No distress. Looks well Neuro: Alert and Oriented x 3. GCS 15. Speech normal Psych: Pleasant Resp:  Barrel Chest - no.  Wheeze - no, Crackles - yes mild, No overt  respiratory distress CVS: Normal heart sounds. Murmurs - no Ext: Stigmata of Connective Tissue Disease - no HEENT: Normal upper airway. PEERL +. No post nasal drip        Assessment:       ICD-10-CM  1. IPF (idiopathic pulmonary fibrosis) (HCC)  J84.112     2. Encounter for medication monitoring  Z51.81     3. Agent orange exposure  Z77.098     4. Stopped smoking with greater than 40 pack year history  Z87.891          Plan:     Patient Instructions  IPF (idiopathic pulmonary fibrosis) (HCC) Agent orange exposure Stopped smoking with greater than 40 pack year history  - you have IPF that is a progressive disease - recently PFT stable - LFT elevated OCt 2023  Plan  - antifibrotics indicated - detailed discussion - start esbriet per protocol provided LFT fine - check LFT 04/21/2022 - no clinical trials for now as discussed - avoid exposures at home  - smoke  - mold  - feather pillows, jackets, softs, blankets  - sick people = take article on agent orange exposure and IPF   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 Kalman Shan, M.D., F.C.C.P,  Pulmonary and Critical Care Medicine Staff Physician, [facility] Director - Interstitial Lung Disease  Program  Pulmonary Fibrosis [facility], [place], [address]  Pager: [phone], If no answer or between  15:00h - 7:00h: call [phone] Telephone: [phone]  10:43 AM 04/21/2022

## 2022-04-22 ENCOUNTER — Telehealth: Payer: Self-pay

## 2022-04-22 ENCOUNTER — Other Ambulatory Visit (HOSPITAL_COMMUNITY): Payer: Self-pay

## 2022-04-22 DIAGNOSIS — J84112 Idiopathic pulmonary fibrosis: Secondary | ICD-10-CM

## 2022-04-22 NOTE — Telephone Encounter (Signed)
Received New start paperwork for ESBRIET. Will update as we work through the benefits process.  Pt is covered through the [facility] community care network and rx can be sent directly to [facility] pharmacy without need for a PA on our end. Pt was referred from [facility] per MR's note.  PAP application sent to scan center for retention.

## 2022-04-24 MED ORDER — PIRFENIDONE 267 MG PO TABS: ORAL_TABLET | ORAL | 4 refills | Status: DC

## 2022-04-24 MED ORDER — PIRFENIDONE 267 MG PO TABS: ORAL_TABLET | ORAL | 0 refills | Status: DC

## 2022-04-24 NOTE — Telephone Encounter (Signed)
Rx for pirfenidone escribed to [facility]. Clinicals sent via fax  Fax: [phone] Phone: [phone]   Chesley Mires, PharmD, MPH, BCPS, CPP Clinical Pharmacist (Rheumatology and Pulmonology)

## 2022-04-27 NOTE — Telephone Encounter (Signed)
Spoke with patient's wife regarding pirfenidone rx. Advised that rx was escribed to [facility] and they should be mailing to medication to their home. They should receive within 2 weeks. Pharmacy team to follow-up if medication has been received  Chesley Mires, PharmD, MPH, BCPS, CPP Clinical Pharmacist (Rheumatology and Pulmonology)

## 2022-05-04 ENCOUNTER — Telehealth: Payer: Self-pay | Admitting: Internal Medicine

## 2022-05-04 DIAGNOSIS — Z5181 Encounter for therapeutic drug level monitoring: Secondary | ICD-10-CM

## 2022-05-04 NOTE — Telephone Encounter (Signed)
Attempted  call pt but unable to reach. Left message for him to return call.

## 2022-05-04 NOTE — Telephone Encounter (Signed)
LFT now normal    Latest Reference Range & Units 12/31/10 15:01 12/18/11 11:49 07/05/13 13:43 01/27/16 22:42 01/01/22 15:44 01/27/22 10:44 03/04/22 09:07 04/21/22 10:54  AST 0 - 37 U/L 25 25 23 25 24  44 (H) 44 (H) 64 (H) 35  ALT 0 - 53 U/L 17 13 14 20 16  40 40 123 (H) 23  (H): Data is abnormally high  Plan  - recheck 64month

## 2022-05-05 NOTE — Telephone Encounter (Signed)
I spoke with the pt's spouse and notified of results/recs per MR  She verbalized understanding  I have ordered future lab  Nothing further needed

## 2022-05-11 NOTE — Telephone Encounter (Signed)
Spoke with patient's wife regarding pirfenidone status. She confirmed that she has been in contact with pharmacy at [facility]. They have received authorization and will be reaching out to her in the next few days to schedule shipment of medication to home. She said she spoke with pharmacist at [facility] about side effects. I reviewed again major side effects of nausea, rash/sun sensitivity and less frequent side effects like appetite loss, weight loss, fatigue. I did review lab monitoring (LFTs monthly x 3-6 months).   Mrs. Higginson is most concerned about patient staying adherent to a medication that is three times daily. He has memory loss (on memantine) and she works during the day - so she is not able to come home and make sure he takes the medication. She is afraid he may not remember to take afternoon dose at home unless reminded by her. She states he is supposed to take an anxiety medication in the afternoon but only takes it sporadically (again because of forgetfulness).  I did review the recommendation is for him to take pirfenidone at least 5-6 hours apart if possible but no sooner than 4 hours to minimize risk of nausea. She verbalized understanding understanding to all of the above.  She will send MyChart message to our clinic once patient starts pirfenidone.  Chesley Mires, PharmD, MPH, BCPS, CPP Clinical Pharmacist (Rheumatology and Pulmonology)

## 2022-06-02 ENCOUNTER — Ambulatory Visit (INDEPENDENT_AMBULATORY_CARE_PROVIDER_SITE_OTHER): Payer: No Typology Code available for payment source | Admitting: Nurse Practitioner

## 2022-06-02 ENCOUNTER — Telehealth: Payer: Self-pay | Admitting: Nurse Practitioner

## 2022-06-02 ENCOUNTER — Encounter: Payer: Self-pay | Admitting: Nurse Practitioner

## 2022-06-02 VITALS — BP 124/78 | HR 63 | Ht 68.0 in | Wt 223.0 lb

## 2022-06-02 DIAGNOSIS — Z227 Latent tuberculosis: Secondary | ICD-10-CM

## 2022-06-02 DIAGNOSIS — Z5181 Encounter for therapeutic drug level monitoring: Secondary | ICD-10-CM | POA: Diagnosis not present

## 2022-06-02 DIAGNOSIS — J84112 Idiopathic pulmonary fibrosis: Secondary | ICD-10-CM

## 2022-06-02 DIAGNOSIS — K219 Gastro-esophageal reflux disease without esophagitis: Secondary | ICD-10-CM

## 2022-06-02 MED ORDER — FAMOTIDINE 20 MG PO TABS: 20.0000 mg | ORAL_TABLET | Freq: Every day | ORAL | 5 refills | Status: AC

## 2022-06-02 NOTE — Assessment & Plan Note (Addendum)
Progressive phenotype; categorized as probable UIP on imaging and diagnosed with IPF in July 2023. He was started on antifibrotic therapy with Esbriet, which he started in December 2023. Tolerating well thus far. Suspect his increased nocturnal cough is related to GERD. He is already on PPI therapy. We will add on famotidine for breakthrough at bedtime. Target GERD reduction measures - reviewed today. If no improvement, may move to twice daily PPI.  Encouraged him to increase activity. We discussed role of pulmonary rehab, but this would not work with their current schedules. Repeat PFT, walk, and symptom score at follow up. Check CMET today - will need every 4 weeks for 6 months then every 3 months while on antifibrotics.  Patient Instructions  Continue Esbriet 3 pills three times a day -Take with food -Wear sunscreen when outside Continue Albuterol inhaler 2 puffs every 6 hours as needed for shortness of breath or wheezing. Continue omeprazole 1 capsule daily   Start famotidine (Pepcid) 20 mg At bedtime  Elevate your head at night, at least 30 degrees Try to avoid eating 2-3 hours before bed   Check labs today   Repeat spirometry/DLCO in 10 weeks   Follow up with Dr. Chase Caller after 30 min PFT in 10 weeks. If symptoms worsen, please contact office for sooner follow up or seek emergency care.

## 2022-06-02 NOTE — Patient Instructions (Addendum)
Continue Esbriet 3 pills three times a day -Take with food -Wear sunscreen when outside Continue Albuterol inhaler 2 puffs every 6 hours as needed for shortness of breath or wheezing. Continue omeprazole 1 capsule daily   Start famotidine (Pepcid) 20 mg At bedtime  Elevate your head at night, at least 30 degrees Try to avoid eating 2-3 hours before bed   Check labs today   Repeat spirometry/DLCO in 10 weeks   Follow up with Dr. Chase Caller after 30 min PFT in 10 weeks. If symptoms worsen, please contact office for sooner follow up or seek emergency care.

## 2022-06-02 NOTE — Assessment & Plan Note (Addendum)
No red flag symptoms. See above.
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
## 2022-06-02 NOTE — Assessment & Plan Note (Signed)
Completed treatment with INF/rifampin in November 2023. Followed by ID.

## 2022-06-02 NOTE — Telephone Encounter (Signed)
Patient's wife called to schedule 10 week follow up and PFT- MR's schedule is not out for March- so recall was placed. Patient needs to know how early/ when he can come in to complete lab work that was ordered. Holly advised. Nothing further needed.

## 2022-06-04 ENCOUNTER — Telehealth: Payer: Self-pay | Admitting: Nurse Practitioner

## 2022-06-04 NOTE — Telephone Encounter (Signed)
Attempted to call the [facility] four different times and with the ext that was given. Phone line will ring 5 times and then be routed back to the main menu about covid 19 and suicide crisis line. Will try again

## 2022-06-09 ENCOUNTER — Encounter: Payer: Self-pay | Admitting: Internal Medicine

## 2022-06-09 NOTE — Telephone Encounter (Signed)
I think -to keep it simple and if the wife needs to supervise just take 3 pills in the morning and then take 3 pills in the afternoon for a total of 6 pills/day.  Skip the afternoon dose.  This would be a low-dose protocol.  This will still be effective although not as effective.  And will have less side effects  Then at follow-up we can look for an alternative plan

## 2022-06-09 NOTE — Telephone Encounter (Signed)
MR, please advise on pt's message regarding Esbriet and the variation in administration frequency. Thanks.

## 2022-06-16 ENCOUNTER — Other Ambulatory Visit (INDEPENDENT_AMBULATORY_CARE_PROVIDER_SITE_OTHER): Payer: No Typology Code available for payment source

## 2022-06-16 DIAGNOSIS — R0609 Other forms of dyspnea: Secondary | ICD-10-CM

## 2022-06-16 DIAGNOSIS — Z5181 Encounter for therapeutic drug level monitoring: Secondary | ICD-10-CM | POA: Diagnosis not present

## 2022-06-16 LAB — COMPREHENSIVE METABOLIC PANEL
ALT: 15 U/L (ref 0–53)
AST: 25 U/L (ref 0–37)
Albumin: 4.4 g/dL (ref 3.5–5.2)
Alkaline Phosphatase: 85 U/L (ref 39–117)
BUN: 17 mg/dL (ref 6–23)
CO2: 28 mEq/L (ref 19–32)
Calcium: 9.3 mg/dL (ref 8.4–10.5)
Chloride: 96 mEq/L (ref 96–112)
Creatinine, Ser: 1.19 mg/dL (ref 0.40–1.50)
GFR: 59.16 mL/min — ABNORMAL LOW (ref >60.00)
Glucose, Bld: 116 mg/dL — ABNORMAL HIGH (ref 70–99)
Potassium: 4.1 mEq/L (ref 3.5–5.1)
Sodium: 132 mEq/L — ABNORMAL LOW (ref 135–145)
Total Bilirubin: 0.5 mg/dL (ref 0.2–1.2)
Total Protein: 7.3 g/dL (ref 6.0–8.3)

## 2022-06-16 LAB — HEPATIC FUNCTION PANEL
ALT: 15 U/L (ref 0–53)
AST: 25 U/L (ref 0–37)
Albumin: 4.4 g/dL (ref 3.5–5.2)
Alkaline Phosphatase: 85 U/L (ref 39–117)
Bilirubin, Direct: 0.1 mg/dL (ref 0.0–0.3)
Total Bilirubin: 0.5 mg/dL (ref 0.2–1.2)
Total Protein: 7.3 g/dL (ref 6.0–8.3)

## 2022-06-17 LAB — RHEUMATOID FACTOR: Rheumatoid fact SerPl-aCnc: <14 IU/mL (ref <14)

## 2022-06-17 NOTE — Progress Notes (Signed)
Please notify patient that his sodium is low at 132 (normal 135-145). Please have him recheck with his PCP in 1-2 weeks. Thanks.

## 2022-06-18 ENCOUNTER — Telehealth: Payer: Self-pay

## 2022-06-18 NOTE — Telephone Encounter (Signed)
Spoke with [name] patient's wife regarding blood work and to f/u with PCP in 1-2 weeks to recheck sodium. PT's wife doesn't want to drive all the way to the [facility] in [place] and was just wanting [place] to recheck his sodium in our office. Joellen Jersey can you please advise. Thank you

## 2022-06-19 ENCOUNTER — Other Ambulatory Visit: Payer: Self-pay

## 2022-06-19 DIAGNOSIS — Z5181 Encounter for therapeutic drug level monitoring: Secondary | ICD-10-CM

## 2022-06-19 NOTE — Telephone Encounter (Signed)
Spoke with patients wife [name]. Advised he can complete his lab work here. Orders have been placed. Nothing further needed.

## 2022-06-19 NOTE — Telephone Encounter (Signed)
Ok for him to return for sodium recheck. Please place orders for BMET. Thanks.

## 2022-07-08 NOTE — Telephone Encounter (Signed)
See mychart message from 01/10. Will close encounter.

## 2022-07-14 ENCOUNTER — Other Ambulatory Visit (INDEPENDENT_AMBULATORY_CARE_PROVIDER_SITE_OTHER): Payer: No Typology Code available for payment source

## 2022-07-14 ENCOUNTER — Other Ambulatory Visit: Payer: Self-pay

## 2022-07-14 DIAGNOSIS — Z5181 Encounter for therapeutic drug level monitoring: Secondary | ICD-10-CM | POA: Diagnosis not present

## 2022-07-14 LAB — BASIC METABOLIC PANEL
BUN: 14 mg/dL (ref 6–23)
CO2: 28 mEq/L (ref 19–32)
Calcium: 9.5 mg/dL (ref 8.4–10.5)
Chloride: 104 mEq/L (ref 96–112)
Creatinine, Ser: 1.1 mg/dL (ref 0.40–1.50)
GFR: 64.98 mL/min (ref >60.00)
Glucose, Bld: 93 mg/dL (ref 70–99)
Potassium: 4.4 mEq/L (ref 3.5–5.1)
Sodium: 139 mEq/L (ref 135–145)

## 2022-07-20 ENCOUNTER — Telehealth: Payer: Self-pay | Admitting: Internal Medicine

## 2022-07-20 NOTE — Telephone Encounter (Signed)
Called and spoke with cindy. [name] stated that the patient needed a refill on esbriet sent in to the [facility]. [name] also stated that the patient had a breathing test done on February 9th and she wants to know if the patient needs to keep the appointment for the PFT they have scheduled here since he's already had a breathing test done.   MR, please advise.

## 2022-07-20 NOTE — Telephone Encounter (Signed)
Pt. wife calling about he needs med refiil on Pirfenidone (ESBRIET) [facility] I have sched. a f/u with [place] and a 54mn PFT but Pt. wife [facility] [place] to [place] if [place] still needs to do PFT  or if [place]

## 2022-07-23 NOTE — Telephone Encounter (Signed)
  WE will need the PFT results from [facility] before seeing me  No need for PFT with [place]     Latest Ref Rng & Units 04/21/2022    8:41 AM 12/22/2021   11:50 AM  PFT Results  FVC-Pre L 3.73  3.80   FVC-Predicted Pre % 96  98   Pre FEV1/FVC % % 83  83   FEV1-Pre L 3.11  3.13   FEV1-Predicted Pre % 112  112   DLCO uncorrected ml/min/mmHg 18.04  17.19   DLCO UNC% % 76  73   DLCO corrected ml/min/mmHg 18.04  17.19   DLCO COR %Predicted % 76  73   DLVA Predicted % 80  81   TLC L  5.98   TLC % Predicted %  89   RV % Predicted %  91

## 2022-07-23 NOTE — Telephone Encounter (Signed)
Spoke to patient's spouse, [name](DPR). She stated that she would obtain PFT results from [facility] [place] and have them faxed to our office.

## 2022-08-02 NOTE — Telephone Encounter (Signed)
Is this encounter done now

## 2022-08-18 ENCOUNTER — Encounter: Payer: Self-pay | Admitting: Internal Medicine

## 2022-08-18 ENCOUNTER — Ambulatory Visit (INDEPENDENT_AMBULATORY_CARE_PROVIDER_SITE_OTHER): Payer: No Typology Code available for payment source | Admitting: Internal Medicine

## 2022-08-18 VITALS — BP 122/66 | HR 62 | Ht 68.0 in | Wt 225.6 lb

## 2022-08-18 DIAGNOSIS — Z5181 Encounter for therapeutic drug level monitoring: Secondary | ICD-10-CM

## 2022-08-18 DIAGNOSIS — J84112 Idiopathic pulmonary fibrosis: Secondary | ICD-10-CM | POA: Diagnosis not present

## 2022-08-18 DIAGNOSIS — R058 Other specified cough: Secondary | ICD-10-CM

## 2022-08-18 DIAGNOSIS — Z77098 Contact with and (suspected) exposure to other hazardous, chiefly nonmedicinal, chemicals: Secondary | ICD-10-CM | POA: Diagnosis not present

## 2022-08-18 NOTE — Progress Notes (Signed)
OV 11/18/2021  Subjective:  Patient ID: Luke Golden, male , DOB: Jul 09, 1945 , age 77 y.o. , MRN: [id number] , ADDRESS: [place] [id number] [facility]CP [facility] Patient Care Team: [facility] as PCP - General  This Provider for this visit: Treatment Team:  Attending Provider: Brand Males, MD PCP Dr Rondell Reams at [facility]   11/18/2021 -   Chief Complaint  Patient presents with   Consult    Pt recently had a PFT performed and is here today to go over the results. Pt does have complaints of an occasional cough and SOB that is worse with exertion.     HPI Luke Golden 77 y.o. -referred by the [facility].  History is obtained from talking to him and his wife.  They live in [place].  Wife works as a Leisure centre manager at [employer].  He is asked weight no more veteran.  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  Denies any agent orange exposure.  Former smoker.  According to the wife for the last few years he has had exertional fatigue and also shortness of breath.  But insidiously and gradually is gotten worse.  Sometime towards the end of 2022 at the beginning of 2023 he did have a viral syndrome according [facility].  Then in the visit with the [facility] in April 2023 it appears that he started complaining of exertional fatigue and shortness of breath.  Was fairly significant.  They did a chest x-ray on him on September 12, 2021.  Only have the report.  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  He has old chest x-ray here that I personally visualized shows some chronic atelectatic changes.  They did a pulmonary function test and it shows normal FEV1 FVC TLC but isolated reduction in diffusion capacity to 64.2% overall wife says that he is got a lot of fatigue dyspnea lower stamina.  Things are getting worse.  His past medical history is significant for  short-term memory loss, left femoral DVT in 2000.  Denies any substance abuse or tobacco abuse alcohol use in the last 20 years.  He has obstructive sleep apnea moderate but he has quit using his CPAP in the last week.  This is because of facial fit issues.  He also has back pain PTSD coronary artery disease hyperlipidemia acid reflux hypertension.   According to him he had a CT scan of the chest but we could not find evidence for this.  According to my CMA has had a echocardiogram at the [place] but I could not find evidence for this.  He had a good walking desaturation test here.     CT Chest data - Nov 2015  CLINICAL DATA:  Chest pain.   EXAM: PORTABLE CHEST - 1 VIEW   COMPARISON:  July 05, 2013.   FINDINGS: Stable cardiomediastinal silhouette. No pneumothorax or pleural effusion is noted. Minimal bibasilar subsegmental atelectasis is noted. Old right rib fractures are noted.   IMPRESSION: Minimal bibasilar subsegmental atelectasis.     Electronically Signed   By: Sabino Dick M.D.   On: 04/23/2014 12:29  No results found.  Nuclear Medicine Stress tst 2017  IMPRESSION: 1. No scintigraphic evidence of prior infarction or pharmacologically induced ischemia.   2. Normal left ventricular wall motion.   3. Left ventricular ejection  fraction 59%   4. Non invasive risk stratification*: Low   *2012 Appropriate Use Criteria for Coronary Revascularization Focused Update: J Am Coll Cardiol. [id number]. http://content.airportbarriers.com.aspx?articleid=[email or web address]     Electronically Signed   By: Sandi Mariscal M.D.   On: 01/28/2016 13:07       12/22/2021: Today-follow-up Patient presents today for follow-up after undergoing high-resolution CT scan, ILD panel and pulmonary function testing.  Pulmonary function testing was completed today and showed normal spirometry and lung volumes.  He did have a mild reduction in DLCO at 73%.  He was unable to complete  postbronchodilator spirometry.  ILD panel was negative aside from low positive ANA and QuantiFERON gold.  No active evidence of TB on recent imaging.  He has never been told in the past that he has TB or has been exposed to it.  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  Today, he reports feeling unchanged when compared to when he was here last.  Breathing is overall stable.  He does get winded, primarily with climbing and long distances.  He also has ongoing issues with fatigue; however, he had stopped using his CPAP, which could be a large contributing factor.  He has since restarted and feels like he has gotten more restful sleep and wakes up feeling better in the morning.  He denies any recent fevers, night sweats, hemoptysis, weight loss, anorexia, lower extremity edema, orthopnea. Denies joint pain, dry mouth or eyes. He has yet to complete ONO and echocardiogram.  Never tried any inhalers for his shortness of breath.   IMPRESSION: 1. Findings are indicative of interstitial lung disease, clearly progressive compared to remote prior study from 2012, with a spectrum of findings considered probable usual interstitial pneumonia (UIP) per current ATS guidelines. Repeat high-resolution chest CT is suggested in 12 months to assess for temporal changes in the appearance of the lung parenchyma. 2. Aortic atherosclerosis, in addition to left main and three-vessel coronary artery disease. Assessment for potential risk factor modification, dietary therapy or pharmacologic therapy may be warranted, if clinically indicated. 3. There are calcifications of the aortic valve and mitral annulus. Echocardiographic correlation for evaluation of potential valvular dysfunction may be warranted if clinically indicated. 4. Cholelithiasis.   Aortic Atherosclerosis (ICD10-I70.0).     Electronically Signed   By: Vinnie Langton M.D.    On: 12/09/2021 09:38  OV 01/27/2022  Subjective:  Patient ID: Luke Golden, male , DOB: 1946/02/06 , age 77 y.o. , MRN: [id number] , ADDRESS: [place] [phone] [facility]CP [facility] Patient Care Team: [facility] - General  This Provider for this visit: Treatment Team:  Attending Provider: Brand Males, MD    01/27/2022 -   Chief Complaint  Patient presents with   Follow-up    Pt states he has been doing okay since last visit. Pt is still SOB but states it is about the same since last visit.    HPI Luke Golden 77 y.o. -returns for follow-up.  I last saw him a few months ago and then he saw a Designer, jewellery.  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  INH and rifampin have been commenced.  After that he called our office or at least the wife called and reported that he  was having shakes and trembles and frequent falls.  Also complaining of dyspnea on exertion.  Neither of them are able to tell me that if the symptoms are worse than before or of the onset of it started after the starting of INH and rifampin.  Definitely the call happened for the shakes and falls and the tremors after he got started on INH and rifampin.  But they are unable to explicitly tell me the duration.  In addition he was given ILD question in July 2023 by the nurse practitioner.  He was supposed to bring it with him today but I do not have it.  The wife believes that she fill it up and brought it and gave it to [place].  He does admit to previous street drug use.  He also admits to previous heavy smoking.  He also admits to agent orange exposure for a year while he was in [place].  Of note the wife works at [employer].  She is now needing to care for him.  [facility] for her to care for him.  But she needs a letter of support.  I did indicate to her about the FMLA  option.  But she does want a caregiver support letter.      OV 04/21/2022  Subjective:  Patient ID: Luke Golden, male , DOB: Oct 16, 1945 , age 26 y.o. , MRN: [id number] , ADDRESS: [place] [phone] [facility]CP [facility] Patient Care Team: [facility] - General  This Provider for this visit: Treatment Team:  Attending Provider: Brand Males, MD    04/21/2022 -   Chief Complaint  Patient presents with   Follow-up    PFT performed today.  Pt finished taking the TB medication about 1 month ago. Still coughing up phlegm in the mornings that is clear in color. States that he does have complaints of SOB.  HPI Luke Golden 77 y.o. -returns for follow-up.  He is here to discuss his pulmonary function test and symptoms symptoms are stable.  Pulmonary function test is stable.  He has completed his INH treatment.  Gave him and his wife diagnosis of IPF based on above criteria.  This was reminded and read discussion because the short-term memory loss.  In the interim he did get COVID-19 in September 2023 and was on Paxlovid.  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  There are no new issues.   We discussed several aspects of his care plan.  We discussed clinical trials but given his short-term memory loss and inability to consent and his lack of interest in "being a [place] pig" they are not interested in that at this point in time.  They might revisit this in the future.  Discussed pirfenidone and nintedanib.  He does not want to do nintedanib because of the diarrhea side effect.  He feels he is overweight and would rather go with pirfenidone which would give him some nausea and anorexia and possible weight loss. Discussed the fact is 3 pills 3 times daily and just apply sunscreen and he is to space this out 5 to 6 hours apart.  His wife made notes about all this.  Liver function test monitoring is required.  They decided to go with  pirfenidone.   06/02/2022: Today - follow up - NP Patient presents today for follow up with his wife. He started Esbriet around 2 weeks ago. He is getting ready to start his  third week and maintenance dose of 3 pills, three times a day. He has tolerated it well so far. His wife does feel like he has been coughing more at night since he started it but otherwise, his symptoms are stable. Cough is only productive in AM with clear sputum. He does feel like his reflux is a little worse than it has been; otherwise, no GI issues. No fevers, chills, night sweats, hemoptysis. He knows he needs to be more active, which he is going to work on.    OV 08/18/2022  Subjective:  Patient ID: Luke Golden, male , DOB: 1946-04-19 , age 75 y.o. , MRN: [id number] , ADDRESS: [place] [id number] [facility] Patient Care Team: [facility] as P[facility] - General  This Provider for this visit: Treatment Team:  Attending Provider: Brand Males, MD    Follow-up  -  idiopathic pulmonary fibrosis [IPF-diagnosis given December 22, 2021 by nurse practitioner  -Male gender, age greater than 17, remote heavy smoking, agent orange exposure, probable UIP by CT chest with progression and negative serology.  -QuantiFERON gold positive.  Seen Dr. Megan Salon 01/01/2022 and started on INH and rifampin x93-month  -Short-term memory loss and wife with significant caregiver burden   08/18/2022 -   Chief Complaint  Patient presents with   Follow-up    Pft review, cough all the times started in ( September ) does use robitussin. Post covid.      HPI Luke Golden 77 y.o. -returns for follow-up.  He presents with his wife Luke Golden.  His wife Luke Golden is on a wheelchair because she fractured her feet in February 2024.  Normally she is the caregiver driving him but this time he drove her.  He tells me that over a month ago he stopped taking pirfenidone because he was tired and dizzy and  constipated and occasional nausea.  He did not inform his wife about it.  Instead he was giving her the empty pillbox.  She only knew about it today when he revealed this information to me.  But from a shortness of breath standpoint he feels stable.  Shortness of breath score is stable.  However he does have significant amount of cough.  Robitussin controls it.  Wife does not want to pay for over-the-counter Robitussin but is asking for a prescription so they can get the prescription from the [place] at low cost/no cost.  However review of his medications reveals that he is taking lisinopril.  He is willing to stop this.  He did agree that if stopping this and switching over to another medication of my recommendation does not help then he is willing to try other medications or further testing.   He was supposed to have interim pulmonary function test.  Apparently was done at the [facility] but we have not obtained it.  CMA reports it is very difficult to get pulmonary function test from the [place] - ILD 01/27/2022 04/21/2022  08/18/2022 225# - esbreit stoppd x 1 oth  Current weight     O2 use ra ra   Shortness of Breath 0 -> 5 scale with 5 being worst (score 6 If unable to do)    At rest 2 2 2   Simple tasks - showers, clothes change, eating, shaving 2 1 3   Household (dishes, doing bed, laundry) 3 2 4   Shopping 4.5 2 3   Walking level at own pace 4 1 4  Walking up Stairs 5 2 4   Total (30-36) Dyspnea Score 20.5 11 20   How bad is your cough? 2 2 4   How bad is your fatigue 4 intermitten 4  How bad is nausea 0 0 0  How bad is vomiting?  0 0 0  How bad is diarrhea? 0 0 0  How bad is anxiety? 2 3 4   How bad is depression 4 3 4   Any chronic pain - if so where and how bad x x x       Simple office walk 185 feet x  3 laps goal with forehead probe 11/18/2021  04/21/2022    O2 used ra ra   Number laps completed 3 3   Comments about pace Mod pace mod   Resting Pulse Ox/HR 100%% and  67/min 100% and 65   Final Pulse Ox/HR 98% and 107/min 99% and HR 98   Desaturated </= 88% no no   Desaturated <= 3% points no no   Got Tachycardic >/= 90/min yes yes   Symptoms at end of test None none   Miscellaneous comments x      PFT     Latest Ref Rng & Units 04/21/2022    8:41 AM 12/22/2021   11:50 AM  PFT Results  FVC-Pre L 3.73  3.80   FVC-Predicted Pre % 96  98   Pre FEV1/FVC % % 83  83   FEV1-Pre L 3.11  3.13   FEV1-Predicted Pre % 112  112   DLCO uncorrected ml/min/mmHg 18.04  17.19   DLCO UNC% % 76  73   DLCO corrected ml/min/mmHg 18.04  17.19   DLCO COR %Predicted % 76  73   DLVA Predicted % 80  81   TLC L  5.98   TLC % Predicted %  89   RV % Predicted %  91        has a past medical history of Anxiety, Arthritis, Headache(784.0), Hypertension, Peripheral vascular disease ([place]), Pneumonia, Pneumothorax, and Shortness of breath.   reports that he quit smoking about 26 years ago. His smoking use included cigarettes and cigars. He has a 48.00 pack-year smoking history. He has quit using smokeless tobacco.  His smokeless tobacco use included chew.  Past Surgical History:  Procedure Laterality Date   CARDIAC CATHETERIZATION     2012 - [place],medical therapy- as follow up    COLONOSCOPY W/ BIOPSIES AND POLYPECTOMY     Hx; of   EYE SURGERY     cataracts removed - /w IOL- bilateral    FRACTURE SURGERY     R elbow- 2000   JOINT REPLACEMENT Bilateral    TONSILLECTOMY     as an adult    TOTAL KNEE ARTHROPLASTY  12/23/2011   Procedure: TOTAL KNEE ARTHROPLASTY;  Surgeon: Ninetta Lights, MD;  Location: [facility];  Service: Orthopedics;  Laterality: Left;   TOTAL KNEE ARTHROPLASTY Right 07/12/2013   Procedure: TOTAL KNEE ARTHROPLASTY;  Surgeon: Ninetta Lights, MD;  Location: [facility];  Service: Orthopedics;  Laterality: Right;    Allergies  Allergen Reactions   Cephalosporins Hives   Tylenol [Acetaminophen] Other (See Comments)    Feel weird       Immunization History  Administered Date(s) Administered   DTaP 09/01/2013   Fluad Quad(high Dose 65+) 05/07/2020   Influenza, High Dose Seasonal PF 04/27/2016, 04/13/2017, 04/03/2022   Influenza-Unspecified 08/10/2012, 03/14/2014   Pfizer Covid-19 Vaccine Bivalent Booster 74yrs & up 01/01/2022  Pneumococcal Conjugate-13 07/20/2014   Pneumococcal-Unspecified 03/22/2012   Td 12/26/2021   Tdap 03/22/2012   Zoster Recombinat (Shingrix) 12/29/2017, 03/17/2018    Family History  Problem Relation Age of Onset   Heart disease Father    Heart disease Brother      Current Outpatient Medications:    aspirin EC 81 MG tablet, Take 81 mg by mouth daily., Disp: , Rfl:    busPIRone (BUSPAR) 10 MG tablet, Take 15-30 mg by mouth See admin instructions. Take 15 mg by mouth in the morning and take 30 mg by mouth at bedtime, Disp: , Rfl:    famotidine (PEPCID) 20 MG tablet, Take 1 tablet (20 mg total) by mouth at bedtime., Disp: 30 tablet, Rfl: 5   lisinopril (PRINIVIL,ZESTRIL) 2.5 MG tablet, Take 2.5 mg by mouth at bedtime. , Disp: , Rfl:    memantine (NAMENDA) 10 MG tablet, TAKE ONE TABLET BY MOUTH AT BEDTIME TO SLOW MEMORY LOSS, Disp: , Rfl:    omeprazole (PRILOSEC) 40 MG capsule, TAKE ONE CAPSULE BY MOUTH TWICE A DAY FOR HEARTBURN (TAKE ON AN EMPTY STOMACH 30 MINUTES PRIOR TO A MEAL), Disp: , Rfl:    Pirfenidone (ESBRIET) 267 MG TABS, Month 1: Take 1 tab three times daily for 7 days, then 2 tabs three times daily for 7 days, then 3 tabs three times daily thereafter., Disp: 207 tablet, Rfl: 0   Pirfenidone 267 MG TABS, Month 2 and onwards: Take 3 tablets by mouth three times daily, Disp: 270 tablet, Rfl: 4   rosuvastatin (CRESTOR) 20 MG tablet, Take 10 mg by mouth daily. , Disp: , Rfl:    sildenafil (VIAGRA) 100 MG tablet, TAKE ONE TABLET BY MOUTH AS DIRECTED (TAKE 1 HOUR PRIOR TO SEXUAL ACTIVITY *DO NOT EXCEED 1 DOSE PER 24 HOUR PERIOD*), Disp: , Rfl:    simethicone (MYLICON) 80 MG chewable  tablet, CHEW TWO TABLETS BY MOUTH THREE TIMES A DAY AFTER MEALS AND AT BEDTIME AS NEEDED FOR GAS, Disp: , Rfl:    albuterol (VENTOLIN HFA) 108 (90 Base) MCG/ACT inhaler, Inhale 2 puffs into the lungs every 6 (six) hours as needed for wheezing or shortness of breath. (Patient not taking: Reported on 08/18/2022), Disp: 8 g, Rfl: 2   vitamin B-12 (CYANOCOBALAMIN) 500 MCG tablet, Take 2 tablets by mouth daily. (Patient not taking: Reported on 04/21/2022), Disp: , Rfl:       Objective:   Vitals:   08/18/22 1508  BP: 122/66  Pulse: 62  SpO2: 98%  Weight: 225 lb 9.6 oz (102.3 kg)  Height: 5\' 8"  (1.727 m)    Estimated body mass index is 34.3 kg/m as calculated from the following:   Height as of this encounter: 5\' 8"  (1.727 m).   Weight as of this encounter: 225 lb 9.6 oz (102.3 kg).  @WEIGHTCHANGE @  [employer]   08/18/22 1508  Weight: 225 lb 9.6 oz (102.3 kg)     Physical Exam    General: No distress. Looks well Neuro: Alert and Oriented x 3. GCS 15. Speech normal Psych: Pleasant Resp:  Barrel Chest - no.  Wheeze - no, Crackles - YES, No overt respiratory distress CVS: Normal heart sounds. Murmurs - no Ext: Stigmata of Connective Tissue Disease - no HEENT: Normal upper airway. PEERL +. No post nasal drip        Assessment:       ICD-10-CM   1. IPF (idiopathic pulmonary fibrosis) ([place])  J84.112     2. Agent orange  exposure  Z77.098     3. Medication monitoring encounter  Z51.81     4. Cough due to ACE inhibitor  R05.8    T46.4X5A          Plan:     Patient Instructions     ICD-10-CM   1. IPF (idiopathic pulmonary fibrosis) ([place])  J84.112     2. Agent orange exposure  Z77.098     3. Medication monitoring encounter  Z51.81     4. Cough due to ACE inhibitor  R05.8    T46.4X5A       #IPF  - Clinically stable based on history - Agree with you the pirfenidone/Esbriet caused a lot of side effect such as fatigue and some nausea -and he stopped it 1 month  ago  Plan - No more pirfenidone; CMA TO mark it as allergy -Start nintedanib low-dose protocol 100 mg twice daily [extensively cautioned about side effects] -Sign release to get your pulmonary function test from the [facility]  #Chronic cough  -This is likely due to pulmonary fibrosis but also your lisinopril is contributing to it  Plan  - Please continue Robitussin as needed - Please talk to primary care physician and stop lisinopril but instead -> start amlodipine 5 mg once daily -If cough still does not go away after stopping lisinopril we can consider blood allergy testing  #Follow-up - 6 weeks with Dr. Chase Caller or nurse practitioner to review uptake with nintedanib  -Symptoms: Walking desaturation test at follow-up    SIGNATURE    Dr. Brand Males, M.D., F.C.C.P,  Pulmonary and Critical Care Medicine Staff Physician, [facility] Director - Interstitial Lung Disease  Program  Pulmonary [facility] at [place], [place], [address]  Pager: [phone], If no answer or between  15:00h - 7:00h: call [phone] Telephone: [phone]  3:43 PM 08/18/2022

## 2022-08-18 NOTE — Patient Instructions (Addendum)
ICD-10-CM   1. IPF (idiopathic pulmonary fibrosis) ([place])  J84.112     2. Agent orange exposure  Z77.098     3. Medication monitoring encounter  Z51.81     4. Cough due to ACE inhibitor  R05.8    T46.4X5A       #IPF  - Clinically stable based on history - Agree with you the pirfenidone/Esbriet caused a lot of side effect such as fatigue and some nausea -and he stopped it 1 month ago  Plan - No more pirfenidone; CMA TO mark it as allergy -Start nintedanib low-dose protocol 100 mg twice daily [extensively cautioned about side effects] -Sign release to get your pulmonary function test from the [facility]  #Chronic cough  -This is likely due to pulmonary fibrosis but also your lisinopril is contributing to it  Plan  - Please continue Robitussin as needed - Please talk to primary care physician and stop lisinopril but instead -> start amlodipine 5 mg once daily -If cough still does not go away after stopping lisinopril we can consider blood allergy testing  #Follow-up - 6 weeks with Dr. Chase Caller or nurse practitioner to review uptake with nintedanib  -Symptoms: Walking desaturation test at follow-up

## 2022-08-24 ENCOUNTER — Telehealth: Payer: Self-pay | Admitting: Internal Medicine

## 2022-08-24 NOTE — Telephone Encounter (Signed)
[name] states patient needs new RX for Nintadanib. Pharmacy is [facility]. [name] phone number is [phone]

## 2022-08-24 NOTE — Telephone Encounter (Signed)
Pharmacy team can you please assist? See previous encounter

## 2022-08-25 ENCOUNTER — Telehealth: Payer: Self-pay | Admitting: Internal Medicine

## 2022-08-25 NOTE — Telephone Encounter (Signed)
Patient's spouse would like the nurse to call because she has some questions regarding patient's last visit.  Please call spouse at [phone] to discuss.

## 2022-08-25 NOTE — Telephone Encounter (Signed)
Patient called again to change previous message and let the doctor know that he does not want a new RX for Nintadanib and stated that he has started taking the Pirfenidone (ESBRIET) 267 MG TABS again.  Please disregard the previous message and send RX for the Pirfenidone.

## 2022-08-26 NOTE — Telephone Encounter (Signed)
Patient's wife called and said she was on another call and missed the call from the nurse.  Please call back at [phone]

## 2022-08-26 NOTE — Telephone Encounter (Signed)
Calld pt no answer, lvmm for pt to call the office back

## 2022-08-27 NOTE — Telephone Encounter (Signed)
Noted. Will not send Ofev rx at this point  Knox Saliva, PharmD, MPH, BCPS, CPP Clinical Pharmacist (Rheumatology and Pulmonology)

## 2022-08-27 NOTE — Telephone Encounter (Signed)
Noted, thank you - Ofev rx has not been sent  Knox Saliva, PharmD, MPH, BCPS, CPP Clinical Pharmacist (Rheumatology and Pulmonology)

## 2022-08-27 NOTE — Telephone Encounter (Signed)
Spoke to wife of pt and she stated the pt wants to continue Pirfenidone 267 MG (ESBRIET) instead of Nintadanib. Also she had a lot of questions which I answered to best of my ability. And the questions I could not answer I will send Dr Chase Caller a message to get wife's questions answered. I informed pt's wife of this and she verbalized understanding.

## 2022-08-31 NOTE — Telephone Encounter (Signed)
Mailed Esbriet patient asst. Form to pt.

## 2022-09-24 ENCOUNTER — Encounter: Payer: Self-pay | Admitting: Nurse Practitioner

## 2022-09-24 ENCOUNTER — Ambulatory Visit (INDEPENDENT_AMBULATORY_CARE_PROVIDER_SITE_OTHER): Payer: No Typology Code available for payment source | Admitting: Nurse Practitioner

## 2022-09-24 VITALS — BP 114/60 | HR 74 | Ht 68.0 in | Wt 218.0 lb

## 2022-09-24 DIAGNOSIS — Z86010 Personal history of colonic polyps: Secondary | ICD-10-CM

## 2022-09-24 DIAGNOSIS — K219 Gastro-esophageal reflux disease without esophagitis: Secondary | ICD-10-CM

## 2022-09-24 DIAGNOSIS — J84112 Idiopathic pulmonary fibrosis: Secondary | ICD-10-CM

## 2022-09-24 MED ORDER — NA SULFATE-K SULFATE-MG SULF 17.5-3.13-1.6 GM/177ML PO SOLN: 1.0000 | Freq: Once | ORAL | 0 refills | Status: AC

## 2022-09-24 NOTE — Patient Instructions (Addendum)
You have been scheduled for an endoscopy and colonoscopy. Please follow the written instructions given to you at your visit today. Please pick up your prep supplies at the pharmacy within the next 1-3 days. If you use inhalers (even only as needed), please bring them with you on the day of your procedure.  Continue Omeprazole- take 30 minutes before breakfast   Miralax- every night as needed  Due to recent changes in healthcare laws, you may see the results of your imaging and laboratory studies on MyChart before your provider has had a chance to review them.  We understand that in some cases there may be results that are confusing or concerning to you. Not all laboratory results come back in the same time frame and the provider may be waiting for multiple results in order to interpret others.  Please give [place] 48 hours in order for your provider to thoroughly review all the results before contacting the office for clarification of your results.   Thank you for trusting me with your gastrointestinal care!   Alcide Evener, CRNP \
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
## 2022-09-25 ENCOUNTER — Telehealth: Payer: Self-pay | Admitting: Nurse Practitioner

## 2022-09-25 MED ORDER — OMEPRAZOLE 40 MG PO CPDR: 40.0000 mg | DELAYED_RELEASE_CAPSULE | Freq: Every day | ORAL | 1 refills | Status: AC

## 2022-09-25 NOTE — Telephone Encounter (Signed)
Contacted pt & informed pt that Instructions and Plenvu sample placed at front on the 3rd floor.

## 2022-09-25 NOTE — Telephone Encounter (Signed)
PT will pick up sample of Plenvu. Please redo instructions as well.

## 2022-09-25 NOTE — Progress Notes (Signed)
Agree with assessment and plan as outlined.  Sounds like he was referred her specifically for hospital based anesthesia for these procedures [facility]. While he does not meet specific criteria we have for hospital cases, he does have multiple comorbidities and pulmonary fibrosis, not unreasonable to do his cases at the hospital if they are more comfortable with that. As such, I think I would schedule them for the hospital to do these and cancel the [facility] procedures, if that is their preference. Thanks

## 2022-09-25 NOTE — Telephone Encounter (Signed)
Contacted pt & LVM to return call regarding prep

## 2022-09-25 NOTE — Telephone Encounter (Signed)
Patients [facility] pharmacy calling states the prep sent in is not available at their pharmacy, can do Golytely or Moviprep. Please advise

## 2022-09-28 NOTE — Progress Notes (Signed)
Luke Golden, please contact the patient and let him know Dr. Adela Lank reviewed his new patient office consult and verified his procedures are appropriate to complete at [facility].  Pls cancel his EGD/colonoscopy ant [facility] and schedule him for an EGD and colonoscopy with Dr. Adela Lank at [facility].  Thank you

## 2022-09-29 ENCOUNTER — Telehealth: Payer: Self-pay

## 2022-09-29 NOTE — Telephone Encounter (Signed)
Please see note below. 

## 2022-09-29 NOTE — Telephone Encounter (Signed)
Message Received: Luke Golden, Luke Carl, NP  Emeline Darling, RN Viviann Spare, I sent you a message a few minutes ago about this patient. Pls reschedule his procedures at [facility]. You can forward this request to [facility] if you prefer. Pls let me know when rescheduled. THX       Previous Messages  Office Visit for Colon Polyps; Heartburn 09/24/2022 Arnaldo Natal, NP -  [facility] Diagnoses  Gastroesophageal Reflux Disease, Unspecified Whether Esophagitis Present (Primary) History of colonic polyps IPF (idiopathic pulmonary fibrosis) (HCC) Orders Signed This Visit  (4) omeprazole (PRILOSEC) 40 MG capsule Ambulatory referral to Gastroenterology Na Sulfate-K Sulfate-Mg Sulf 17.5-3.13-1.6 GM/177ML SOLN amLODipine (NORVASC) 10 MG tablet Orders Pended This Visit  None Progress Notes  Arnaldo Natal, NP at 09/24/2022  2:30 PM  Status: Signed  Viviann Spare, please contact the patient and let him know Dr. Adela Lank reviewed his new patient office consult and verified his procedures are appropriate to complete at [facility].  Pls cancel his EGD/colonoscopy ant [facility] and schedule him for an EGD and colonoscopy with Dr. Adela Lank at [facility].  Thank you    Armbruster, Willaim Rayas, MD at 09/24/2022  2:30 PM  Status: Signed  Agree with assessment and plan as outlined.  Sounds like he was referred her specifically for hospital based anesthesia for these procedures [facility]. While he does not meet specific criteria we have for hospital cases, he does have multiple comorbidities and pulmonary fibrosis, not unreasonable to do his cases at the hospital if they are more comfortable with that. As such, I think I would schedule them for the hospital to do these and cancel the [facility] procedures, if that is their preference. Thanks

## 2022-09-30 NOTE — Telephone Encounter (Signed)
Called and spoke with patient's wife to cancel and reschedule procedures to [facility]. Reviewed information that patient does not necessarily meet criteria for hospital based procedures, but can be done there if that is what they prefer. I explained that there is a hospital wait list and we are booked out several months at a time. Pt would be contacted as appts became available. [name] had questions regarding coverage of the procedures in a hospital based setting. I told her that I was not sure about this and they would let her know once a new authorization was obtained. [name] does not think that hospital case will be covered if it not medically necessary for the patient to have procedures completed there. [name] would like to keep patient's EGD and colonoscopy as scheduled in the [facility] for June. [name] had no concerns at the end of the call.

## 2022-10-01 NOTE — Telephone Encounter (Signed)
Okay thanks [employer]. I will ask Cathlyn Parsons to review his record as well to make sure anesthesia is okay with this.  John - can you review this patient's file to determine if you think we can proceed at the [facility] or if he would need the hospital - I don't see any clear indication to do this at the hospital based on our criteria unless you feel otherwise. Thanks

## 2022-10-02 ENCOUNTER — Telehealth: Payer: Self-pay | Admitting: *Deleted

## 2022-10-02 NOTE — Telephone Encounter (Signed)
Great thanks John.  [facility], okay to keep procedures as scheduled in the [facility] if he can let them know. Thanks

## 2022-10-02 NOTE — Telephone Encounter (Signed)
Dr. Adela Lank,  This pt is cleared for anesthetic care at [facility].  Regards,  Cathlyn Parsons

## 2022-10-02 NOTE — Telephone Encounter (Signed)
Called and informed pt's wife [name] of [facility] clearance. [name] verbalized understanding and had no concerns at the end of the call.

## 2022-10-02 NOTE — Telephone Encounter (Signed)
See alternate telephone encounter. Pt cleared for [facility] procedures.

## 2022-10-06 ENCOUNTER — Encounter: Payer: Self-pay | Admitting: Internal Medicine

## 2022-10-06 ENCOUNTER — Ambulatory Visit: Payer: No Typology Code available for payment source | Admitting: Internal Medicine

## 2022-10-06 VITALS — BP 140/80 | HR 72 | Ht 68.0 in | Wt 217.0 lb

## 2022-10-06 DIAGNOSIS — Z77098 Contact with and (suspected) exposure to other hazardous, chiefly nonmedicinal, chemicals: Secondary | ICD-10-CM

## 2022-10-06 DIAGNOSIS — Z5181 Encounter for therapeutic drug level monitoring: Secondary | ICD-10-CM | POA: Diagnosis not present

## 2022-10-06 DIAGNOSIS — J84112 Idiopathic pulmonary fibrosis: Secondary | ICD-10-CM | POA: Diagnosis not present

## 2022-10-06 LAB — CBC WITH DIFFERENTIAL/PLATELET
Basophils Absolute: 0.1 10*3/uL (ref 0.0–0.1)
Basophils Relative: 1.2 % (ref 0.0–3.0)
Eosinophils Absolute: 0.4 10*3/uL (ref 0.0–0.7)
Eosinophils Relative: 5.4 % — ABNORMAL HIGH (ref 0.0–5.0)
HCT: 43.1 % (ref 39.0–52.0)
Hemoglobin: 14.9 g/dL (ref 13.0–17.0)
Lymphocytes Relative: 19.5 % (ref 12.0–46.0)
Lymphs Abs: 1.5 10*3/uL (ref 0.7–4.0)
MCHC: 34.6 g/dL (ref 30.0–36.0)
MCV: 96.6 fl (ref 78.0–100.0)
Monocytes Absolute: 0.5 10*3/uL (ref 0.1–1.0)
Monocytes Relative: 6.2 % (ref 3.0–12.0)
Neutro Abs: 5.3 10*3/uL (ref 1.4–7.7)
Neutrophils Relative %: 67.7 % (ref 43.0–77.0)
Platelets: 173 10*3/uL (ref 150.0–400.0)
RBC: 4.46 Mil/uL (ref 4.22–5.81)
RDW: 12.8 % (ref 11.5–15.5)
WBC: 7.8 10*3/uL (ref 4.0–10.5)

## 2022-10-06 LAB — HEPATIC FUNCTION PANEL
ALT: 16 U/L (ref 0–53)
AST: 26 U/L (ref 0–37)
Albumin: 4.4 g/dL (ref 3.5–5.2)
Alkaline Phosphatase: 90 U/L (ref 39–117)
Bilirubin, Direct: 0.1 mg/dL (ref 0.0–0.3)
Total Bilirubin: 0.4 mg/dL (ref 0.2–1.2)
Total Protein: 7.7 g/dL (ref 6.0–8.3)

## 2022-10-06 NOTE — Progress Notes (Signed)
OV 11/18/2021  Subjective:  Patient ID: Luke Golden, male , DOB: Jul 09, 1945 , age 77 y.o. , MRN: [id number] , ADDRESS: [place] [id number] [facility]CP [facility] Patient Care Team: [facility] as PCP - General  This Provider for this visit: Treatment Team:  Attending Provider: Brand Males, MD PCP Dr Rondell Reams at [facility]   11/18/2021 -   Chief Complaint  Patient presents with   Consult    Pt recently had a PFT performed and is here today to go over the results. Pt does have complaints of an occasional cough and SOB that is worse with exertion.     HPI Luke Golden 77 y.o. -referred by the [facility].  History is obtained from talking to him and his wife.  They live in [place].  Wife works as a Leisure centre manager at [employer].  He is asked weight no more veteran.  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  Denies any agent orange exposure.  Former smoker.  According to the wife for the last few years he has had exertional fatigue and also shortness of breath.  But insidiously and gradually is gotten worse.  Sometime towards the end of 2022 at the beginning of 2023 he did have a viral syndrome [facility].  Then in the visit with the [facility] in April 2023 it appears that he started complaining of exertional fatigue and shortness of breath.  Was fairly significant.  They did a chest x-ray on him on September 12, 2021.  Only have the report.  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  He has old chest x-ray here that I personally visualized shows some chronic atelectatic changes.  They did a pulmonary function test and it shows normal FEV1 FVC TLC but isolated reduction in diffusion capacity to 64.2% overall wife says that he is got a lot of fatigue dyspnea lower stamina.  Things are getting worse.  His past medical history is significant for  short-term memory loss, left femoral DVT in 2000.  Denies any substance abuse or tobacco abuse alcohol use in the last 20 years.  He has obstructive sleep apnea moderate but he has quit using his CPAP in the last week.  This is because of facial fit issues.  He also has back pain PTSD coronary artery disease hyperlipidemia acid reflux hypertension.   According to him he had a CT scan of the chest but we could not find evidence for this.  According to my CMA has had a echocardiogram at the [place] but I could not find evidence for this.  He had a good walking desaturation test here.     CT Chest data - Nov 2015  CLINICAL DATA:  Chest pain.   EXAM: PORTABLE CHEST - 1 VIEW   COMPARISON:  July 05, 2013.   FINDINGS: Stable cardiomediastinal silhouette. No pneumothorax or pleural effusion is noted. Minimal bibasilar subsegmental atelectasis is noted. Old right rib fractures are noted.   IMPRESSION: Minimal bibasilar subsegmental atelectasis.     Electronically Signed   By: Sabino Dick M.D.   On: 04/23/2014 12:29  No results found.  Nuclear Medicine Stress tst 2017  IMPRESSION: 1. No scintigraphic evidence of prior infarction or pharmacologically induced ischemia.   2. Normal left ventricular wall motion.   3. Left ventricular ejection  fraction 59%   4. Non invasive risk stratification*: Low   *2012 Appropriate Use Criteria for Coronary Revascularization Focused Update: J Am Coll Cardiol. [id number]. http://content.airportbarriers.com.aspx?articleid=[email or web address]     Electronically Signed   By: Sandi Mariscal M.D.   On: 01/28/2016 13:07       12/22/2021: Today-follow-up Patient presents today for follow-up after undergoing high-resolution CT scan, ILD panel and pulmonary function testing.  Pulmonary function testing was completed today and showed normal spirometry and lung volumes.  He did have a mild reduction in DLCO at 73%.  He was unable to complete  postbronchodilator spirometry.  ILD panel was negative aside from low positive ANA and QuantiFERON gold.  No active evidence of TB on recent imaging.  He has never been told in the past that he has TB or has been exposed to it.  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  Today, he reports feeling unchanged when compared to when he was here last.  Breathing is overall stable.  He does get winded, primarily with climbing and long distances.  He also has ongoing issues with fatigue; however, he had stopped using his CPAP, which could be a large contributing factor.  He has since restarted and feels like he has gotten more restful sleep and wakes up feeling better in the morning.  He denies any recent fevers, night sweats, hemoptysis, weight loss, anorexia, lower extremity edema, orthopnea. Denies joint pain, dry mouth or eyes. He has yet to complete ONO and echocardiogram.  Never tried any inhalers for his shortness of breath.   IMPRESSION: 1. Findings are indicative of interstitial lung disease, clearly progressive compared to remote prior study from 2012, with a spectrum of findings considered probable usual interstitial pneumonia (UIP) per current ATS guidelines. Repeat high-resolution chest CT is suggested in 12 months to assess for temporal changes in the appearance of the lung parenchyma. 2. Aortic atherosclerosis, in addition to left main and three-vessel coronary artery disease. Assessment for potential risk factor modification, dietary therapy or pharmacologic therapy may be warranted, if clinically indicated. 3. There are calcifications of the aortic valve and mitral annulus. Echocardiographic correlation for evaluation of potential valvular dysfunction may be warranted if clinically indicated. 4. Cholelithiasis.   Aortic Atherosclerosis (ICD10-I70.0).     Electronically Signed   By: Vinnie Langton M.D.    On: 12/09/2021 09:38  OV 01/27/2022  Subjective:  Patient ID: Luke Golden, male , DOB: 1946/02/06 , age 77 y.o. , MRN: [id number] , ADDRESS: [place] [id number] [facility]CP [facility] Patient Care Team: [facility] - General  This Provider for this visit: Treatment Team:  Attending Provider: Brand Males, MD    01/27/2022 -   Chief Complaint  Patient presents with   Follow-up    Pt states he has been doing okay since last visit. Pt is still SOB but states it is about the same since last visit.    HPI Luke Golden 77 y.o. -returns for follow-up.  I last saw him a few months ago and then he saw a Designer, jewellery.  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  INH and rifampin have been commenced.  After that he called our office or at least the wife called and reported that he  was having shakes and trembles and frequent falls.  Also complaining of dyspnea on exertion.  Neither of them are able to tell me that if the symptoms are worse than before or of the onset of it started after the starting of INH and rifampin.  Definitely the call happened for the shakes and falls and the tremors after he got started on INH and rifampin.  But they are unable to explicitly tell me the duration.  In addition he was given ILD question in July 2023 by the nurse practitioner.  He was supposed to bring it with him today but I do not have it.  The wife believes that she fill it up and brought it and gave it to [place].  He does admit to previous street drug use.  He also admits to previous heavy smoking.  He also admits to agent orange exposure for a year while he was in [place].  Of note the wife works at [employer].  She is now needing to care for him.  [facility] her to care for him.  But she needs a letter of support.  I did indicate to her about the FMLA  option.  But she does want a caregiver support letter.      OV 04/21/2022  Subjective:  Patient ID: Luke Golden, male , DOB: Oct 16, 1945 , age 26 y.o. , MRN: [id number] , ADDRESS: [place] [address] [facility]CP [facility] Patient Care Team: [facility] - General  This Provider for this visit: Treatment Team:  Attending Provider: Brand Males, MD    04/21/2022 -   Chief Complaint  Patient presents with   Follow-up    PFT performed today.  Pt finished taking the TB medication about 1 month ago. Still coughing up phlegm in the mornings that is clear in color. States that he does have complaints of SOB.  HPI Luke Golden 77 y.o. -returns for follow-up.  He is here to discuss his pulmonary function test and symptoms symptoms are stable.  Pulmonary function test is stable.  He has completed his INH treatment.  Gave him and his wife diagnosis of IPF based on above criteria.  This was reminded and read discussion because the short-term memory loss.  In the interim he did get COVID-19 in September 2023 and was on Paxlovid.  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  There are no new issues.   We discussed several aspects of his care plan.  We discussed clinical trials but given his short-term memory loss and inability to consent and his lack of interest in "being a [place] pig" they are not interested in that at this point in time.  They might revisit this in the future.  Discussed pirfenidone and nintedanib.  He does not want to do nintedanib because of the diarrhea side effect.  He feels he is overweight and would rather go with pirfenidone which would give him some nausea and anorexia and possible weight loss. Discussed the fact is 3 pills 3 times daily and just apply sunscreen and he is to space this out 5 to 6 hours apart.  His wife made notes about all this.  Liver function test monitoring is required.  They decided to go with  pirfenidone.   06/02/2022: Today - follow up - NP Patient presents today for follow up with his wife. He started Esbriet around 2 weeks ago. He is getting ready to start his  third week and maintenance dose of 3 pills, three times a day. He has tolerated it well so far. His wife does feel like he has been coughing more at night since he started it but otherwise, his symptoms are stable. Cough is only productive in AM with clear sputum. He does feel like his reflux is a little worse than it has been; otherwise, no GI issues. No fevers, chills, night sweats, hemoptysis. He knows he needs to be more active, which he is going to work on.    OV 08/18/2022  Subjective:  Patient ID: Luke Golden, male , DOB: 04/02/1946 , age 42 y.o. , MRN: [id number] , ADDRESS: [address] [place] [address] [facility]CP [facility] Patient Care Team: [facility] as PCP - General  This Provider for this visit: Treatment Team:  Attending Provider: Kalman Shan, MD    08/18/2022 -   Chief Complaint  Patient presents with   Follow-up    Pft review, cough all the times started in ( September ) does use robitussin. Post covid.      HPI Luke Golden 77 y.o. -returns for follow-up.  He presents with his wife [name].  His wife [name] is on a wheelchair because she fractured her feet in February 2024.  Normally she is the caregiver driving him but this time he drove her.  He tells me that over a month ago he stopped taking pirfenidone because he was tired and dizzy and constipated and occasional nausea.  He did not inform his wife about it.  Instead he was giving her the empty pillbox.  She only knew about it today when he revealed this information to me.  But from a shortness of breath standpoint he feels stable.  Shortness of breath score is stable.  However he does have significant amount of cough.  Robitussin controls it.  Wife does not want to pay for over-the-counter Robitussin but is  asking for a prescription so they can get the prescription from the [place] at low cost/no cost.  However review of his medications reveals that he is taking lisinopril.  He is willing to stop this.  He did agree that if stopping this and switching over to another medication of my recommendation does not help then he is willing to try other medications or further testing.   He was supposed to have interim pulmonary function test.  Apparently was done at the [facility] but we have not obtained it.  CMA reports it is very difficult to get pulmonary function test from the [place].  OV 10/06/2022  Subjective:  Patient ID: Luke Golden, male , DOB: 05/20/1946 , age 85 y.o. , MRN: [id number] , ADDRESS: [address] [place] [address] [facility]CP [facility] Patient Care Team: [facility] - General  This Provider for this visit: Treatment Team:  Attending Provider: Kalman Shan, MD    Follow-up  -  idiopathic pulmonary fibrosis [IPF-diagnosis given December 22, 2021 by nurse practitioner  -Male gender, age greater than 75, remote heavy smoking, agent orange exposure, probable UIP by CT chest with progression and negative serology.  -Restarted pirfenidone 08/18/2019 on his own  -QuantiFERON gold positive.  Seen Dr. Orvan Falconer 01/01/2022 and started on INH and rifampin x24-month  -Short-term memory loss and wife with significant caregiver burden  -Sleep apnea does not use CPAP  -Severe baseline chronic cough.  Stop lisinopril March 2024.   10/06/2022 -  Chief Complaint  Patient presents with   Follow-up    F/up on IPF     HPI Luke Golden 77 y.o. -returns for follow-up.  Presents with his wife [name].  After the last visit they complained about pirfenidone side effect so we switched him to nintedanib but they went home and spoke to the pharmacist and apparently the pharmacist said that he was on borrowed time and therefore he restarted his pirfenidone.  Today  says he is tolerating it well.  Although the wife and he said that he is sleeping a lot and he gets up late.  It is unclear if this got worse after the pirfenidone start.  It seems like it has been going on for quite a while.  Last visit I also told him to stop his lisinopril but his cough continues.  It is quite severe.  He takes Robitussin which controls it.  It is unclear if the cough is improved after stopping lisinopril the wife does say that she does not hear him cough is much but it could be because of Robitussin.  Shortness of breath appears to be the same but the main complaint is that there is a lot of fatigue and sleepiness.  He is not using his CPAP.  I did a sit/stand test on him 10 times.  He did desaturate to 87/86%.  And he recovered with rest.  He did get a little dyspneic.  He does not have portable oxygen he does not have night oxygen.    SYMPTOM SCALE - ILD 01/27/2022 04/21/2022  08/18/2022 225# - esbreit stoppd x 1 oth 10/06/2022 Back on esbriet since last OV  Current weight      O2 use ra ra  ra  Shortness of Breath 0 -> 5 scale with 5 being worst (score 6 If unable to do)     At rest 2 2 2 5   Simple tasks - showers, clothes change, eating, shaving 2 1 3 3   Household (dishes, doing bed, laundry) 3 2 4 3   Shopping 4.5 2 3 3   Walking level at own pace 4 1 4 3   Walking up Stairs 5 2 4 4   Total (30-36) Dyspnea Score 20.5 11 20 21   How bad is your cough? 2 2 4 4  - off ace inhibbit  How bad is your fatigue 4 intermitten 4 0  How bad is nausea 0 0 0 0  How bad is vomiting?  0 0 0 0  How bad is diarrhea? 0 0 0 0  How bad is anxiety? 2 3 4 2   How bad is depression 4 3 4 4   Any chronic pain - if so where and how bad x x x x       Simple office walk 185 feet x  3 laps goal with forehead probe 11/18/2021  04/21/2022  10/06/2022   O2 used ra ra ra  Number laps completed 3 3 2   Comments about pace Mod pace mod Sist stand x 10  Resting Pulse Ox/HR 100%% and 67/min 100% and 65  98% and HR 78  Final Pulse Ox/HR 98% and 107/min 99% and HR 98 87% and HR 91  Desaturated </= 88% no no yes  Desaturated <= 3% points no no yes  Got Tachycardic >/= 90/min yes yes yes  Symptoms at end of test None none 3 of 10 dyspnea  Miscellaneous comments x      PFT     Latest Ref Rng &  Units 04/21/2022    8:41 AM 12/22/2021   11:50 AM  PFT Results  FVC-Pre L 3.73  3.80   FVC-Predicted Pre % 96  98   Pre FEV1/FVC % % 83  83   FEV1-Pre L 3.11  3.13   FEV1-Predicted Pre % 112  112   DLCO uncorrected ml/min/mmHg 18.04  17.19   DLCO UNC% % 76  73   DLCO corrected ml/min/mmHg 18.04  17.19   DLCO COR %Predicted % 76  73   DLVA Predicted % 80  81   TLC L  5.98   TLC % Predicted %  89   RV % Predicted %  91        has a past medical history of Anxiety, Arthritis, Headache(784.0), History of colonic polyps, Hypertension, Peripheral vascular disease (HCC), Pneumonia, Pneumothorax, Pulmonary fibrosis (HCC), Short-term memory loss, and Shortness of breath.   reports that he quit smoking about 26 years ago. His smoking use included cigarettes and cigars. He has a 48.00 pack-year smoking history. He has quit using smokeless tobacco.  His smokeless tobacco use included chew.  Past Surgical History:  Procedure Laterality Date   CARDIAC CATHETERIZATION     2012 - [place],medical therapy- as follow up    COLONOSCOPY W/ BIOPSIES AND POLYPECTOMY     Hx; of   EYE SURGERY     cataracts removed - /w IOL- bilateral    FRACTURE SURGERY     R elbow- 2000   JOINT REPLACEMENT Bilateral    TONSILLECTOMY     as an adult    TOTAL KNEE ARTHROPLASTY  12/23/2011   Procedure: TOTAL KNEE ARTHROPLASTY;  Surgeon: Loreta Ave, MD;  Location: [facility] OR;  Service: Orthopedics;  Laterality: Left;   TOTAL KNEE ARTHROPLASTY Right 07/12/2013   Procedure: TOTAL KNEE ARTHROPLASTY;  Surgeon: Loreta Ave, MD;  Location: [facility] OR;  Service: Orthopedics;  Laterality: Right;    Allergies  Allergen  Reactions   Pirfenidone     Pt had skin bur   Lisinopril Cough   Cephalosporins Hives   Tylenol [Acetaminophen] Other (See Comments)    Feel weird      Immunization History  Administered Date(s) Administered   DTaP 09/01/2013   Fluad Quad(high Dose 65+) 05/07/2020   Influenza, High Dose Seasonal PF 04/27/2016, 04/13/2017, 04/03/2022   Influenza-Unspecified 08/10/2012, 03/14/2014   Pfizer Covid-19 Vaccine Bivalent Booster 49yrs & up 01/01/2022   Pneumococcal Conjugate-13 07/20/2014   Pneumococcal-Unspecified 03/22/2012   Rsv, Bivalent, Protein Subunit Rsvpref,pf Verdis Frederickson) 05/13/2022   Td 12/26/2021   Tdap 03/22/2012   Zoster Recombinat (Shingrix) 12/29/2017, 03/17/2018    Family History  Problem Relation Age of Onset   Heart disease Mother    Heart disease Father    Heart disease Brother    Colon cancer Neg Hx    Colon polyps Neg Hx    Esophageal cancer Neg Hx    Pancreatic cancer Neg Hx    Stomach cancer Neg Hx      Current Outpatient Medications:    albuterol (VENTOLIN HFA) 108 (90 Base) MCG/ACT inhaler, Inhale 2 puffs into the lungs every 6 (six) hours as needed for wheezing or shortness of breath., Disp: 8 g, Rfl: 2   amLODipine (NORVASC) 10 MG tablet, Take 10 mg by mouth daily., Disp: , Rfl:    aspirin EC 81 MG tablet, Take 81 mg by mouth daily., Disp: , Rfl:    busPIRone (BUSPAR) 10 MG tablet, Take 15-30 mg by  mouth See admin instructions. Take 15 mg by mouth in the morning and take 30 mg by mouth at bedtime, Disp: , Rfl:    famotidine (PEPCID) 20 MG tablet, Take 1 tablet (20 mg total) by mouth at bedtime., Disp: 30 tablet, Rfl: 5   memantine (NAMENDA) 10 MG tablet, TAKE ONE TABLET BY MOUTH AT BEDTIME TO SLOW MEMORY LOSS, Disp: , Rfl:    omeprazole (PRILOSEC) 40 MG capsule, Take 1 capsule (40 mg total) by mouth daily. To be taken 30 minutes before breakfast, Disp: 90 capsule, Rfl: 1   Pirfenidone (ESBRIET) 267 MG TABS, Month 1: Take 1 tab three times daily for 7  days, then 2 tabs three times daily for 7 days, then 3 tabs three times daily thereafter., Disp: 207 tablet, Rfl: 0   rosuvastatin (CRESTOR) 20 MG tablet, Take 10 mg by mouth daily. , Disp: , Rfl:    sildenafil (VIAGRA) 100 MG tablet, TAKE ONE TABLET BY MOUTH AS DIRECTED (TAKE 1 HOUR PRIOR TO SEXUAL ACTIVITY *DO NOT EXCEED 1 DOSE PER 24 HOUR PERIOD*), Disp: , Rfl:    simethicone (MYLICON) 80 MG chewable tablet, CHEW TWO TABLETS BY MOUTH THREE TIMES A DAY AFTER MEALS AND AT BEDTIME AS NEEDED FOR GAS, Disp: , Rfl:       Objective:   Vitals:   10/06/22 1303  BP: (!) 140/80  Pulse: 72  SpO2: 97%  Weight: 217 lb (98.4 kg)  Height: 5\' 8"  (1.727 m)    Estimated body mass index is 32.99 kg/m as calculated from the following:   Height as of this encounter: 5\' 8"  (1.727 m).   Weight as of this encounter: 217 lb (98.4 kg).  @WEIGHTCHANGE @  [employer]   10/06/22 1303  Weight: 217 lb (98.4 kg)     Physical Exam    General: No distress. Looks well Neuro: Alert and Oriented x 3. GCS 15. Speech normal Psych: Pleasant Resp:  Barrel Chest - no.  Wheeze - no, Crackles - mild baseal, No overt respiratory distress CVS: Normal heart sounds. Murmurs - no Ext: Stigmata of Connective Tissue Disease - no HEENT: Normal upper airway. PEERL +. No post nasal drip        Assessment:       ICD-10-CM   1. IPF (idiopathic pulmonary fibrosis) (HCC)  J84.112     2. Agent orange exposure  Z77.098     3. Medication monitoring encounter  Z51.81          Plan:     Patient Instructions     ICD-10-CM   1. IPF (idiopathic pulmonary fibrosis) (HCC)  J84.112     2. Agent orange exposure  Z77.098     3. Medication monitoring encounter  Z51.81     4. Cough due to ACE inhibitor  R05.8    T46.4X5A       #IPF  - Clinically stable versus slightly progressive.  You do have exercise hypoxemia.-   -You are back on pirfenidone after talking to the pharmacist.   -This fatigue and also  exertional hypoxemia  Plan - Check liver function test today 10/06/2022 -Continue pirfenidone at full dose but make sure apply sunscreen and space the medication at least 5 to 6 hours apart -Test overnight pulse oximetry on room air -Use portable oxygen 2 L nasal cannula  -CMA to do prescription -Do spirometry and DLCO in the next -8 weeks -Do high-resolution CT chest supine and prone in 8 weeks [last 29 November 2021]  #  Chronic cough  -This is likely due to pulmonary fibrosis  -It continues despite stopping lisinopril  Plan  - Please continue Robitussin as needed - Please talk to primary care physician a and ensure blood pressure is under good control -Do CBC with differential and RAST blood allergy testing today with IgE  #Follow-up - 8 weeks with Dr. Marchelle Gearing or nurse practitioner to review uptake with nintedanib  -Symptoms: Walking desaturation test at follow-up  = Return sooner if needed    SIGNATURE    Dr. Kalman Shan, M.D., F.C.C.P,  Pulmonary and Critical Care Medicine Staff Physician, [facility] Director - Interstitial Lung Disease  Program  Pulmonary Fibrosis [facility] at [facility] [place], [place], [address]  Pager: [phone], If no answer or between  15:00h - 7:00h: call [phone] Telephone: [phone]  1:32 PM 10/06/2022

## 2022-10-06 NOTE — Patient Instructions (Addendum)
ICD-10-CM   1. IPF (idiopathic pulmonary fibrosis) (HCC)  J84.112     2. Agent orange exposure  Z77.098     3. Medication monitoring encounter  Z51.81     4. Cough due to ACE inhibitor  R05.8    T46.4X5A       #IPF  - Clinically stable versus slightly progressive.  You do have exercise hypoxemia.-   -You are back on pirfenidone after talking to the pharmacist.   -This fatigue and also exertional hypoxemia  Plan - Check liver function test today 10/06/2022 -Continue pirfenidone at full dose but make sure apply sunscreen and space the medication at least 5 to 6 hours apart -Test overnight pulse oximetry on room air -Use portable oxygen 2 L nasal cannula  -CMA to do prescription -Do spirometry and DLCO in the next -8 weeks -Do high-resolution CT chest supine and prone in 8 weeks [last 29 November 2021]  #Chronic cough  -This is likely due to pulmonary fibrosis  -It continues despite stopping lisinopril  Plan  - Please continue Robitussin as needed - Please talk to primary care physician a and ensure blood pressure is under good control -Do CBC with differential and RAST blood allergy testing today with IgE  #Follow-up - 8 weeks with Dr. Marchelle Gearing or nurse practitioner to review uptake with nintedanib  -Symptoms: Walking desaturation test at follow-up  = Return sooner if needed printing

## 2022-10-06 NOTE — Addendum Note (Signed)
Addended by: Hedda Slade on: 10/06/2022 02:50 PM   Modules accepted: Orders

## 2022-10-07 LAB — IGE: IgE (Immunoglobulin E), Serum: <2 kU/L (ref <=114)

## 2022-10-10 LAB — ALLERGEN PROFILE, PERENNIAL ALLERGEN IGE

## 2022-10-13 ENCOUNTER — Telehealth: Payer: Self-pay | Admitting: Nurse Practitioner

## 2022-10-13 NOTE — Telephone Encounter (Signed)
Inbound call from patient, states they had a scheduling conflict, rescheduled the appointment 6/18 at 3:30 PM, and would like updated instructions mailed.   Thank you.

## 2022-10-14 NOTE — Telephone Encounter (Signed)
Noted  

## 2022-10-15 ENCOUNTER — Telehealth: Payer: Self-pay | Admitting: Internal Medicine

## 2022-10-15 DIAGNOSIS — J9611 Chronic respiratory failure with hypoxia: Secondary | ICD-10-CM

## 2022-10-15 DIAGNOSIS — J84112 Idiopathic pulmonary fibrosis: Secondary | ICD-10-CM

## 2022-10-15 NOTE — Telephone Encounter (Signed)
Patient still doesn't have his 24. Per Elease Hashimoto with [employer] the new 02 order needs to state Continuous with stationary along with POC.  I called the patient and he states that he doesn't have 02 and he really needs it

## 2022-10-19 NOTE — Telephone Encounter (Signed)
I placed new order as requested per Synetta Fail  Please sign the order in Epic, thanks

## 2022-10-19 NOTE — Telephone Encounter (Signed)
Triage/Leslie/Tay  Are you able to address this o2 issue?  Thanks    SIGNATURE    Dr. Kalman Shan, M.D., F.C.C.P,  Pulmonary and Critical Care Medicine Staff Physician, [facility] Center Director - Interstitial Lung Disease  Program  Pulmonary Fibrosis [facility] [place], [place], [address]   Pager: [phone], If no answer  -> Check AMION or Try [phone] Telephone (clinical office): [phone] Telephone (research): [phone]  1:29 PM 10/19/2022

## 2022-10-21 NOTE — Telephone Encounter (Signed)
[employer] [facility] calling they need O2 stat sent to the they said it wasn't in the referral when it got sent FAX#[phone]

## 2022-10-22 NOTE — Telephone Encounter (Signed)
Thanks. Closing message

## 2022-10-28 ENCOUNTER — Telehealth: Payer: Self-pay | Admitting: Internal Medicine

## 2022-10-28 DIAGNOSIS — J84112 Idiopathic pulmonary fibrosis: Secondary | ICD-10-CM

## 2022-10-28 NOTE — Telephone Encounter (Signed)
Pt wife called in bc Mr. Segur had a appt at the [place] today and they stated he is not eligible bc during his walk test his bc the lowest his oxygen levels went was 88. [facility] recommended [facility]

## 2022-11-03 NOTE — Telephone Encounter (Signed)
[facility] could not left message. Will try again later.

## 2022-11-03 NOTE — Telephone Encounter (Signed)
Patient's wife, [name] called back. Please call at [phone].

## 2022-11-04 NOTE — Telephone Encounter (Signed)
Ok to refer to pulmonary rehab  Odd they said no to oxygen when lowest pulse ox was 88%. Per CMS 88% is when people qualify for oxygen

## 2022-11-04 NOTE — Telephone Encounter (Signed)
Called and spoke with patient's wife [name]. She stated that the patient went to the [place] to qualify for his oxygen and per their walk test, he did not qualify for O2. They suggested pulmonary rehab instead. She wanted to know what MR's thoughts are on pulmonary rehab.   MR, can you please advise? Thanks!

## 2022-11-05 NOTE — Telephone Encounter (Signed)
Called and spoke with patient's wife,provided response from Dr. Marchelle Gearing.  Advised I would put in the referral to [facility] cardiac/pulmonary rehab and that they will contact them once they have an opening.  I let her know that there is often a waiting period.  She verbalized understanding.

## 2022-11-09 ENCOUNTER — Telehealth: Payer: Self-pay | Admitting: Internal Medicine

## 2022-11-09 NOTE — Telephone Encounter (Signed)
PT wife calling. Needs a referral to [facility] but the [place] Dr. Leonie Douglas Will not refer him until she sees the visit/ face to face notes/recomendation from Dr. Elvera Lennox.  Fax [phone]  Wife's # is [phone]

## 2022-11-11 ENCOUNTER — Encounter: Payer: No Typology Code available for payment source | Admitting: Gastroenterology

## 2022-11-11 NOTE — Telephone Encounter (Signed)
Spoke with [name] patient's wife regarding prior message . Advised patient that referral was sent to pulmonary rehab on 11/05/22 and patient is waiting for [facility] to give [facility] for [facility] insure to pay for it .[name] stated she sent a message over toi the [place] on 11/09/22 and waiting . Advised cindy to contact our office if we can help.   [name]'s voice was understanding. Nothing else further needed.

## 2022-11-16 ENCOUNTER — Telehealth: Payer: Self-pay | Admitting: Gastroenterology

## 2022-11-16 NOTE — Telephone Encounter (Signed)
PT has a colonoscopy scheduled for tomorrow. He is on pirfenidone and has to take it with food. He starts his clear liquids today but his wife wants to know what would be considered "food" and should he take this medication during this time.

## 2022-11-16 NOTE — Telephone Encounter (Signed)
He can take his medication as prescribed. If he wanted to take it with a cracker or something like that is small that is fine but nothing other than that for his clear liquid diet / prep today. Definitely no food tomorrow prior to his procedure, can take his pill after his colonoscopy tomorrow. Thanks

## 2022-11-16 NOTE — Telephone Encounter (Signed)
Called and relayed information to patient's wife, [name]. [name] verbalized understanding and had no concerns at the end of the call.

## 2022-11-17 ENCOUNTER — Encounter: Payer: Self-pay | Admitting: Gastroenterology

## 2022-11-17 ENCOUNTER — Ambulatory Visit: Payer: No Typology Code available for payment source | Admitting: Gastroenterology

## 2022-11-17 VITALS — BP 130/62 | HR 51 | Temp 98.4°F | Resp 13 | Ht 68.0 in | Wt 218.0 lb

## 2022-11-17 DIAGNOSIS — D123 Benign neoplasm of transverse colon: Secondary | ICD-10-CM | POA: Diagnosis not present

## 2022-11-17 DIAGNOSIS — Z86010 Personal history of colonic polyps: Secondary | ICD-10-CM | POA: Diagnosis not present

## 2022-11-17 DIAGNOSIS — K219 Gastro-esophageal reflux disease without esophagitis: Secondary | ICD-10-CM | POA: Diagnosis present

## 2022-11-17 DIAGNOSIS — Z09 Encounter for follow-up examination after completed treatment for conditions other than malignant neoplasm: Secondary | ICD-10-CM | POA: Diagnosis not present

## 2022-11-17 DIAGNOSIS — D122 Benign neoplasm of ascending colon: Secondary | ICD-10-CM | POA: Diagnosis not present

## 2022-11-17 DIAGNOSIS — K449 Diaphragmatic hernia without obstruction or gangrene: Secondary | ICD-10-CM

## 2022-11-17 MED ORDER — SODIUM CHLORIDE 0.9 % IV SOLN
500.0000 mL | Freq: Once | INTRAVENOUS | Status: DC
Start: 2022-11-17 — End: 2022-11-17

## 2022-11-17 NOTE — Patient Instructions (Signed)
YOU HAD AN ENDOSCOPIC PROCEDURE TODAY: Refer to the procedure report and other information in the discharge instructions given to you for any specific questions about what was found during the examination. If this information does not answer your questions, please call [facility] office at [phone] to clarify.  ° °YOU SHOULD EXPECT: Some feelings of bloating in the abdomen. Passage of more gas than usual. Walking can help get rid of the air that was put into your GI tract during the procedure and reduce the bloating. If you had a lower endoscopy (such as a colonoscopy or flexible sigmoidoscopy) you may notice spotting of blood in your stool or on the toilet paper. Some abdominal soreness may be present for a day or two, also. ° °DIET: Your first meal following the procedure should be a light meal and then it is ok to progress to your normal diet. A half-sandwich or bowl of soup is an example of a good first meal. Heavy or fried foods are harder to digest and may make you feel nauseous or bloated. Drink plenty of fluids but you should avoid alcoholic beverages for 24 hours. If you had a esophageal dilation, please see attached instructions for diet.   ° °ACTIVITY: Your care partner should take you home directly after the procedure. You should plan to take it easy, moving slowly for the rest of the day. You can resume normal activity the day after the procedure however YOU SHOULD NOT DRIVE, use power tools, machinery or perform tasks that involve climbing or major physical exertion for 24 hours (because of the sedation medicines used during the test).  ° °SYMPTOMS TO REPORT IMMEDIATELY: °A gastroenterologist can be reached at any hour. Please call [phone]  for any of the following symptoms:  °Following lower endoscopy (colonoscopy, flexible sigmoidoscopy) °Excessive amounts of blood in the stool  °Significant tenderness, worsening of abdominal pains  °Swelling of the abdomen that is new, acute  °Fever of 100° or  higher  °Following upper endoscopy (EGD, EUS, ERCP, esophageal dilation) °Vomiting of blood or coffee ground material  °New, significant abdominal pain  °New, significant chest pain or pain under the shoulder blades  °Painful or persistently difficult swallowing  °New shortness of breath  °Black, tarry-looking or red, bloody stools ° °FOLLOW UP:  °If any biopsies were taken you will be contacted by phone or by letter within the next 1-3 weeks. Call [phone]  if you have not heard about the biopsies in 3 weeks.  °Please also call with any specific questions about appointments or follow up tests. ° °
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
## 2022-11-17 NOTE — Progress Notes (Signed)
Vss nad trans to pacu 

## 2022-11-17 NOTE — Op Note (Signed)
[facility] Patient Name: Luke Golden Procedure Date: 11/17/2022 3:15 PM MRN: [id number] Endoscopist: Viviann Spare P. Adela Lank , MD, [id number] Age: 77 Referring MD:  Date of Birth: [birth year] Gender: Male Account #: [id number] Procedure:                Colonoscopy Indications:              High risk colon cancer surveillance: Personal                            history of colonic polyps - last exam 2014 Medicines:                Monitored Anesthesia Care Procedure:                Pre-Anesthesia Assessment:                           - Prior to the procedure, a History and Physical                            was performed, and patient medications and                            allergies were reviewed. The patient's tolerance of                            previous anesthesia was also reviewed. The risks                            and benefits of the procedure and the sedation                            options and risks were discussed with the patient.                            All questions were answered, and informed consent                            was obtained. Prior Anticoagulants: The patient has                            taken no anticoagulant or antiplatelet agents. ASA                            Grade Assessment: III - A patient with severe                            systemic disease. After reviewing the risks and                            benefits, the patient was deemed in satisfactory                            condition to undergo the procedure.  After obtaining informed consent, the colonoscope                            was passed under direct vision. Throughout the                            procedure, the patient's blood pressure, pulse, and                            oxygen saturations were monitored continuously. The                            Olympus SN [id number] was introduced through the anus                            and  advanced to the the cecum, identified by                            appendiceal orifice and ileocecal valve. The                            colonoscopy was performed without difficulty. The                            patient tolerated the procedure well. The quality                            of the bowel preparation was adequate. The                            ileocecal valve, appendiceal orifice, and rectum                            were photographed. Scope In: 3:43:22 PM Scope Out: 4:03:57 PM Scope Withdrawal Time: 0 hours 14 minutes 39 seconds  Total Procedure Duration: 0 hours 20 minutes 35 seconds  Findings:                 The perianal and digital rectal examinations were                            normal.                           Many medium-mouthed diverticula were found in the                            entire colon, highest burden in left and right                            colon.                           Three sessile polyps were found in the ascending  colon. The polyps were 2 to 6 mm in size. These                            polyps were removed with a cold snare. Resection                            and retrieval were complete.                           A 3 mm polyp was found in the transverse colon. The                            polyp was sessile. The polyp was removed with a                            cold snare. Resection and retrieval were complete.                           Internal hemorrhoids were found during retroflexion.                           The exam was otherwise without abnormality. Complications:            No immediate complications. Estimated blood loss:                            Minimal. Estimated Blood Loss:     Estimated blood loss was minimal. Impression:               - Diverticulosis in the entire examined colon.                           - Three 2 to 6 mm polyps in the ascending colon,                             removed with a cold snare. Resected and retrieved.                           - One 3 mm polyp in the transverse colon, removed                            with a cold snare. Resected and retrieved.                           - Internal hemorrhoids.                           - The examination was otherwise normal. Recommendation:           - Patient has a contact number available for                            emergencies. The signs and symptoms of potential  delayed complications were discussed with the                            patient. Return to normal activities tomorrow.                            Written discharge instructions were provided to the                            patient.                           - Resume previous diet.                           - Continue present medications.                           - Await pathology results. Likely no further                            surveillance is needed (pending pathology results,                            may not be due for another 5 years - at which time                            the patient will be 77 years old, an age at which                            most people stop routine surveillance exams) Viviann Spare P. Kayvion Arneson, MD 11/17/2022 4:13:12 PM This report has been signed electronically.

## 2022-11-17 NOTE — Op Note (Signed)
[facility] Patient Name: Luke Golden Procedure Date: 11/17/2022 3:16 PM MRN: [id number] Endoscopist: Viviann Spare P. Adela Lank , MD, [id number] Age: 77 Referring MD:  Date of Birth: 09-04-1945 Gender: Male Account #: [id number] Procedure:                Upper GI endoscopy Indications:              history of gastro-esophageal reflux disease - rule                            out Barrett's. on omeprazole as needed. Medicines:                Monitored Anesthesia Care Procedure:                Pre-Anesthesia Assessment:                           - Prior to the procedure, a History and Physical                            was performed, and patient medications and                            allergies were reviewed. The patient's tolerance of                            previous anesthesia was also reviewed. The risks                            and benefits of the procedure and the sedation                            options and risks were discussed with the patient.                            All questions were answered, and informed consent                            was obtained. Prior Anticoagulants: The patient has                            taken no anticoagulant or antiplatelet agents. ASA                            Grade Assessment: III - A patient with severe                            systemic disease. After reviewing the risks and                            benefits, the patient was deemed in satisfactory                            condition to undergo the procedure.  After obtaining informed consent, the endoscope was                            passed under direct vision. Throughout the                            procedure, the patient's blood pressure, pulse, and                            oxygen saturations were monitored continuously. The                            Olympus Scope [id number] was introduced through the                            mouth,  and advanced to the second part of duodenum.                            The upper GI endoscopy was accomplished without                            difficulty. The patient tolerated the procedure                            well. Scope In: Scope Out: Findings:                 Esophagogastric landmarks were identified: the                            Z-line was found at 36 cm, the gastroesophageal                            junction was found at 36 cm and the upper extent of                            the gastric folds was found at 39 cm from the                            incisors.                           A 3 cm hiatal hernia was present.                           The Z-line was slightly irregular and was found 36                            cm from the incisors, but did not meet criteria for                            Barrett's.                           The exam of the  esophagus was otherwise normal.                           The entire examined stomach was normal.                           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                           The exam of the duodenum was otherwise normal. Complications:            No immediate complications. Estimated blood loss:                            None. Estimated Blood Loss:     Estimated blood loss: none. Impression:               - Esophagogastric landmarks identified.                           - 3 cm hiatal hernia.                           - Z-line slightly irregular but did not meet                            criteria for Barrett's, 36 cm from the incisors.                           - Normal stomach.                           - Duodenal diverticulum.                           - Normal duodenum otherwise. Recommendation:           - Patient has a contact number available for                            emergencies. The signs and symptoms of potential                             delayed complications were discussed with the                            patient. Return to normal activities tomorrow.                            Written discharge instructions were provided to the                            patient.                           -  Resume previous diet.                           - Continue present medications.                           - Continue omeprazole as needed for reflux Viviann Spare P. Nitin Mckowen, MD 11/17/2022 4:07:33 PM This report has been signed electronically.

## 2022-11-17 NOTE — Progress Notes (Signed)
Called to room to assist during endoscopic procedure.  Patient ID and intended procedure confirmed with present staff. Received instructions for my participation in the procedure from the performing physician.  

## 2022-11-18 ENCOUNTER — Telehealth: Payer: Self-pay | Admitting: *Deleted

## 2022-11-18 NOTE — Telephone Encounter (Signed)
  Follow up Call-     11/17/2022    1:48 PM  Call back number  Post procedure Call Back phone  # [phone]  Permission to leave phone message Yes    [facility]

## 2022-11-30 ENCOUNTER — Telehealth: Payer: Self-pay | Admitting: Internal Medicine

## 2022-11-30 ENCOUNTER — Encounter: Payer: Self-pay | Admitting: Gastroenterology

## 2022-11-30 DIAGNOSIS — J84112 Idiopathic pulmonary fibrosis: Secondary | ICD-10-CM

## 2022-11-30 NOTE — Telephone Encounter (Signed)
[name] wife states patient needs refill for Esbriet. Pharmacy is [facility]. [name] phone number is [phone].

## 2022-11-30 NOTE — Telephone Encounter (Signed)
[name] wife states patient's CT scan needs to be billed thru the [place] not the insurance. [name] phone number is [phone].

## 2022-12-01 NOTE — Telephone Encounter (Signed)
Spoke with pt's wife and made her aware we have requested authorization from [place] and it is in process.  She states she was aware - she spoke to [place] today herself.  She has rescheduled CT for 7/12 and will make sure we have received new auth prior to that appt.  Nothing further needed.

## 2022-12-01 NOTE — Telephone Encounter (Signed)
[name] checking on message for CT scan. [name] phone number is [phone].

## 2022-12-01 NOTE — Telephone Encounter (Signed)
New VA authorization was requested on 6/18 and follow up email was sent to the [place] today for the status of the authorization.

## 2022-12-01 NOTE — Telephone Encounter (Signed)
Per Holly's request I called [facility] yesterday and left vm for Tonya with [facility].  She hasn't called me back and I have left her another vm this afternoon requesting status.

## 2022-12-02 ENCOUNTER — Ambulatory Visit (HOSPITAL_COMMUNITY): Payer: No Typology Code available for payment source

## 2022-12-09 ENCOUNTER — Telehealth: Payer: Self-pay | Admitting: Internal Medicine

## 2022-12-09 MED ORDER — PIRFENIDONE 267 MG PO TABS
801.0000 mg | ORAL_TABLET | Freq: Three times a day (TID) | ORAL | 1 refills | Status: DC
Start: 2022-12-09 — End: 2023-01-05

## 2022-12-09 NOTE — Telephone Encounter (Signed)
[name] wife states patient needs refill for Esbriet. Pharmacy is [facility]. Pt is going to run out

## 2022-12-09 NOTE — Telephone Encounter (Signed)
Refill for pirfenidone sent to [facility]. Called patient's wife, [name], to advise. She will reach out to [place] pharmacy to schedule shipment  Chesley Mires, PharmD, MPH, BCPS, CPP Clinical Pharmacist (Rheumatology and Pulmonology)

## 2022-12-09 NOTE — Telephone Encounter (Signed)
[facility] in big bold letter put urgent pls overnight

## 2022-12-11 ENCOUNTER — Ambulatory Visit (HOSPITAL_BASED_OUTPATIENT_CLINIC_OR_DEPARTMENT_OTHER)
Admission: RE | Admit: 2022-12-11 | Discharge: 2022-12-11 | Disposition: A | Discharge status: Home / Self Care | Payer: No Typology Code available for payment source | Source: Ambulatory Visit | Attending: Internal Medicine | Admitting: Internal Medicine

## 2022-12-11 DIAGNOSIS — J84112 Idiopathic pulmonary fibrosis: Secondary | ICD-10-CM | POA: Diagnosis present

## 2022-12-15 ENCOUNTER — Ambulatory Visit: Payer: No Typology Code available for payment source | Admitting: Internal Medicine

## 2022-12-15 ENCOUNTER — Ambulatory Visit (INDEPENDENT_AMBULATORY_CARE_PROVIDER_SITE_OTHER): Payer: No Typology Code available for payment source | Admitting: Internal Medicine

## 2022-12-15 ENCOUNTER — Encounter: Payer: Self-pay | Admitting: Internal Medicine

## 2022-12-15 ENCOUNTER — Telehealth: Payer: Self-pay | Admitting: Internal Medicine

## 2022-12-15 VITALS — BP 134/70 | HR 58 | Temp 97.7°F | Ht 68.0 in | Wt 221.0 lb

## 2022-12-15 DIAGNOSIS — J439 Emphysema, unspecified: Secondary | ICD-10-CM

## 2022-12-15 DIAGNOSIS — J45991 Cough variant asthma: Secondary | ICD-10-CM

## 2022-12-15 DIAGNOSIS — J84112 Idiopathic pulmonary fibrosis: Secondary | ICD-10-CM

## 2022-12-15 DIAGNOSIS — Z5181 Encounter for therapeutic drug level monitoring: Secondary | ICD-10-CM | POA: Diagnosis not present

## 2022-12-15 DIAGNOSIS — Z77098 Contact with and (suspected) exposure to other hazardous, chiefly nonmedicinal, chemicals: Secondary | ICD-10-CM

## 2022-12-15 LAB — HEPATIC FUNCTION PANEL
ALT: 18 U/L (ref 0–53)
AST: 28 U/L (ref 0–37)
Albumin: 4.4 g/dL (ref 3.5–5.2)
Alkaline Phosphatase: 93 U/L (ref 39–117)
Bilirubin, Direct: 0.1 mg/dL (ref 0.0–0.3)
Total Bilirubin: 0.5 mg/dL (ref 0.2–1.2)
Total Protein: 7.7 g/dL (ref 6.0–8.3)

## 2022-12-15 LAB — PULMONARY FUNCTION TEST
DL/VA % pred: 78 %
DL/VA: 3.12 ml/min/mmHg/L
DLCO cor % pred: 70 %
DLCO cor: 16.34 ml/min/mmHg
DLCO unc % pred: 70 %
DLCO unc: 16.34 ml/min/mmHg
FEF 25-75 Pre: 2.9 L/sec
FEF2575-%Pred-Pre: 149 %
FEV1-%Pred-Pre: 102 %
FEV1-Pre: 2.81 L
FEV1FVC-%Pred-Pre: 111 %
FEV6-%Pred-Pre: 97 %
FEV6-Pre: 3.49 L
FEV6FVC-%Pred-Pre: 107 %
FVC-%Pred-Pre: 91 %
FVC-Pre: 3.49 L
Pre FEV1/FVC ratio: 81 %
Pre FEV6/FVC Ratio: 100 %

## 2022-12-15 LAB — POCT EXHALED NITRIC OXIDE: FeNO level (ppb): 8

## 2022-12-15 MED ORDER — FLUTICASONE FUROATE-VILANTEROL 100-25 MCG/ACT IN AEPB: 1.0000 | INHALATION_SPRAY | Freq: Every day | RESPIRATORY_TRACT | 5 refills | Status: DC

## 2022-12-15 NOTE — Patient Instructions (Addendum)
ICD-10-CM   1. IPF (idiopathic pulmonary fibrosis) (HCC)  J84.112     2. Agent orange exposure  Z77.098     3. Medication monitoring encounter  Z51.81     4. Cough due to ACE inhibitor  R05.8    T46.4X5A       #IPF  - Clinically stable on pulmonary function testing and also CT scan of the chest x 1 year -Overall tolerating pirfenidone well but there is some fatigue which might or might not be related to pirfenidone -Noted that the [place] did not think you had access hypoxemia and refused to give you oxygen  Plan - Check liver function test today 12/15/2022 -Continue pirfenidone at full dose but make sure apply sunscreen and space the medication at least 5 to 6 hours apart  -I have sent a message to the pharmacy team to make sure your refills for pirfenidone do not have any gap -Re- REFER pulmonary rehabilitation  -If there is desaturations at pulm rehabilitation then we can definitely make a case again for portable oxygen  -Unclear why there is still a delay with the [facility] approving pulmonary rehabilitation     #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 Marchelle Gearing   -Symptoms: Walking desaturation test at follow-up  = Return sooner if needed

## 2022-12-15 NOTE — Progress Notes (Signed)
 Spiro/DLCO performed today. 

## 2022-12-15 NOTE — Progress Notes (Signed)
OV 11/18/2021  Subjective:  Patient ID: Luke Golden, male , DOB: 03-31-1946 , age 77 y.o. , MRN: [id number] , ADDRESS: [address] [place] [address] PCP [facility] Patient Care Team: [facility] as PCP - General  This Provider for this visit: Treatment Team:  Attending Provider: Kalman Shan, MD PCP Dr Jess Barters at [facility]   11/18/2021 -   Chief Complaint  Patient presents with   Consult    Pt recently had a PFT performed and is here today to go over the results. Pt does have complaints of an occasional cough and SOB that is worse with exertion.     HPI Luke Golden 77 y.o. -referred by the [facility].  History is obtained from talking to him and his wife.  They live in [place].  Wife works as a Psychologist, occupational at [employer].  He is asked weight no more veteran.  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  Denies any agent orange exposure.  Former smoker.  According to the wife for the last few years he has had exertional fatigue and also shortness of breath.  But insidiously and gradually is gotten worse.  Sometime towards the end of 2022 at the beginning of 2023 he did have a viral syndrome [facility].  Then in the visit with the [facility] in April 2023 it appears that he started complaining of exertional fatigue and shortness of breath.  Was fairly significant.  They did a chest x-ray on him on September 12, 2021.  Only have the report.  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  He has old chest x-ray here that I personally visualized shows some chronic atelectatic changes.  They did a pulmonary function test and it shows normal FEV1 FVC TLC but isolated reduction in diffusion capacity to 64.2% overall wife says that he is got a lot of fatigue dyspnea lower stamina.  Things are getting worse.  His past medical history is significant for short-term  memory loss, left femoral DVT in 2000.  Denies any substance abuse or tobacco abuse alcohol use in the last 20 years.  He has obstructive sleep apnea moderate but he has quit using his CPAP in the last week.  This is because of facial fit issues.  He also has back pain PTSD coronary artery disease hyperlipidemia acid reflux hypertension.   According to him he had a CT scan of the chest but we could not find evidence for this.  According to my CMA has had a echocardiogram at the [place] but I could not find evidence for this.  He had a good walking desaturation test here.     CT Chest data - Nov 2015  CLINICAL DATA:  Chest pain.   EXAM: PORTABLE CHEST - 1 VIEW   COMPARISON:  July 05, 2013.   FINDINGS: Stable cardiomediastinal silhouette. No pneumothorax or pleural effusion is noted. Minimal bibasilar subsegmental atelectasis is noted. Old right rib fractures are noted.   IMPRESSION: Minimal bibasilar subsegmental atelectasis.     Electronically Signed   By: Roque Lias M.D.   On: 04/23/2014 12:29  No results found.  Nuclear Medicine Stress tst 2017  IMPRESSION: 1. No scintigraphic evidence of prior infarction or pharmacologically induced ischemia.   2. Normal left ventricular wall motion.   3. Left ventricular ejection fraction 59%  4. Non invasive risk stratification*: Low   *2012 Appropriate Use Criteria for Coronary Revascularization Focused Update: J Am Coll Cardiol. 2012;59(9):857-881. http://content.dementiazones.com.aspx?articleid=[email or web address]     Electronically Signed   By: Simonne Come M.D.   On: 01/28/2016 13:07       12/22/2021: Today-follow-up Patient presents today for follow-up after undergoing high-resolution CT scan, ILD panel and pulmonary function testing.  Pulmonary function testing was completed today and showed normal spirometry and lung volumes.  He did have a mild reduction in DLCO at 73%.  He was unable to complete postbronchodilator  spirometry.  ILD panel was negative aside from low positive ANA and QuantiFERON gold.  No active evidence of TB on recent imaging.  He has never been told in the past that he has TB or has been exposed to it.  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  Today, he reports feeling unchanged when compared to when he was here last.  Breathing is overall stable.  He does get winded, primarily with climbing and long distances.  He also has ongoing issues with fatigue; however, he had stopped using his CPAP, which could be a large contributing factor.  He has since restarted and feels like he has gotten more restful sleep and wakes up feeling better in the morning.  He denies any recent fevers, night sweats, hemoptysis, weight loss, anorexia, lower extremity edema, orthopnea. Denies joint pain, dry mouth or eyes. He has yet to complete ONO and echocardiogram.  Never tried any inhalers for his shortness of breath.   IMPRESSION: 1. Findings are indicative of interstitial lung disease, clearly progressive compared to remote prior study from 2012, with a spectrum of findings considered probable usual interstitial pneumonia (UIP) per current ATS guidelines. Repeat high-resolution chest CT is suggested in 12 months to assess for temporal changes in the appearance of the lung parenchyma. 2. Aortic atherosclerosis, in addition to left main and three-vessel coronary artery disease. Assessment for potential risk factor modification, dietary therapy or pharmacologic therapy may be warranted, if clinically indicated. 3. There are calcifications of the aortic valve and mitral annulus. Echocardiographic correlation for evaluation of potential valvular dysfunction may be warranted if clinically indicated. 4. Cholelithiasis.   Aortic Atherosclerosis (ICD10-I70.0).     Electronically Signed   By: Trudie Reed M.D.   On: 12/09/2021  09:38  OV 01/27/2022  Subjective:  Patient ID: Luke Golden, male , DOB: [birth year] , age 77 y.o. , MRN: [id number] , ADDRESS: [address] [place] [address] PCP [facility] Patient Care Team: [facility] as PCP - General  This Provider for this visit: Treatment Team:  Attending Provider: Kalman Shan, MD    01/27/2022 -   Chief Complaint  Patient presents with   Follow-up    Pt states he has been doing okay since last visit. Pt is still SOB but states it is about the same since last visit.    HPI LINFORD QUINTELA 77 y.o. -returns for follow-up.  I last saw him a few months ago and then he saw a Publishing rights manager.  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  INH and rifampin have been commenced.  After that he called our office or at least the wife called and reported that he was having shakes and  trembles and frequent falls.  Also complaining of dyspnea on exertion.  Neither of them are able to tell me that if the symptoms are worse than before or of the onset of it started after the starting of INH and rifampin.  Definitely the call happened for the shakes and falls and the tremors after he got started on INH and rifampin.  But they are unable to explicitly tell me the duration.  In addition he was given ILD question in July 2023 by the nurse practitioner.  He was supposed to bring it with him today but I do not have it.  The wife believes that she fill it up and brought it and gave it to [place].  He does admit to previous street drug use.  He also admits to previous heavy smoking.  He also admits to agent orange exposure for a year while he was in [place].  Of note the wife works at [employer].  She is now needing to care for him.  [facility] her to care for him.  But she needs a letter of support.  I did indicate to her about the FMLA option.  But she  does want a caregiver support letter.      OV 04/21/2022  Subjective:  Patient ID: Luke Golden, male , DOB: [birth year] , age 65 y.o. , MRN: [id number] , ADDRESS: [address] [place] [address] PCP [facility] Patient Care Team: [facility] as PCP - General  This Provider for this visit: Treatment Team:  Attending Provider: Kalman Shan, MD    04/21/2022 -   Chief Complaint  Patient presents with   Follow-up    PFT performed today.  Pt finished taking the TB medication about 1 month ago. Still coughing up phlegm in the mornings that is clear in color. States that he does have complaints of SOB.  HPI ELIOR ROBINETTE 77 y.o. -returns for follow-up.  He is here to discuss his pulmonary function test and symptoms symptoms are stable.  Pulmonary function test is stable.  He has completed his INH treatment.  Gave him and his wife diagnosis of IPF based on above criteria.  This was reminded and read discussion because the short-term memory loss.  In the interim he did get COVID-19 in September 2023 and was on Paxlovid.  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  There are no new issues.   We discussed several aspects of his care plan.  We discussed clinical trials but given his short-term memory loss and inability to consent and his lack of interest in "being a [place] pig" they are not interested in that at this point in time.  They might revisit this in the future.  Discussed pirfenidone and nintedanib.  He does not want to do nintedanib because of the diarrhea side effect.  He feels he is overweight and would rather go with pirfenidone which would give him some nausea and anorexia and possible weight loss. Discussed the fact is 3 pills 3 times daily and just apply sunscreen and he is to space this out 5 to 6 hours apart.  His wife made notes about all this.  Liver function test monitoring is required.  They decided to go with  pirfenidone.   06/02/2022: Today - follow up - NP Patient presents today for follow up with his wife. He started Esbriet around 2 weeks ago. He is getting ready to start his third week and maintenance  dose of 3 pills, three times a day. He has tolerated it well so far. His wife does feel like he has been coughing more at night since he started it but otherwise, his symptoms are stable. Cough is only productive in AM with clear sputum. He does feel like his reflux is a little worse than it has been; otherwise, no GI issues. No fevers, chills, night sweats, hemoptysis. He knows he needs to be more active, which he is going to work on.    OV 08/18/2022  Subjective:  Patient ID: Luke Golden, male , DOB: [birth year] , age 44 y.o. , MRN: [id number] , ADDRESS: [address] [place] [address] PCP [facility] Patient Care Team: [facility] as PCP - General  This Provider for this visit: Treatment Team:  Attending Provider: Kalman Shan, MD    08/18/2022 -   Chief Complaint  Patient presents with   Follow-up    Pft review, cough all the times started in ( September ) does use robitussin. Post covid.      HPI BOBAK OGUINN 77 y.o. -returns for follow-up.  He presents with his wife [name].  His wife [name] is on a wheelchair because she fractured her feet in February 2024.  Normally she is the caregiver driving him but this time he drove her.  He tells me that over a month ago he stopped taking pirfenidone because he was tired and dizzy and constipated and occasional nausea.  He did not inform his wife about it.  Instead he was giving her the empty pillbox.  She only knew about it today when he revealed this information to me.  But from a shortness of breath standpoint he feels stable.  Shortness of breath score is stable.  However he does have significant amount of cough.  Robitussin controls it.  Wife does not want to pay for over-the-counter Robitussin but is  asking for a prescription so they can get the prescription from the [place] at low cost/no cost.  However review of his medications reveals that he is taking lisinopril.  He is willing to stop this.  He did agree that if stopping this and switching over to another medication of my recommendation does not help then he is willing to try other medications or further testing.   He was supposed to have interim pulmonary function test.  Apparently was done at the [facility] but we have not obtained it.  CMA reports it is very difficult to get pulmonary function test from the [place].  OV 10/06/2022  Subjective:  Patient ID: Luke Golden, male , DOB: [birth year] , age 82 y.o. , MRN: [id number] , ADDRESS: [address] [place] [address] PCP [facility] Patient Care Team: [facility] as PCP - General  This Provider for this visit: Treatment Team:  Attending Provider: Kalman Shan, MD     10/06/2022 -   Chief Complaint  Patient presents with   Follow-up    F/up on IPF     HPI BARAKA KLATT 77 y.o. -returns for follow-up.  Presents with his wife [name].  After the last visit they complained about pirfenidone side effect so we switched him to nintedanib but they went home and spoke to the pharmacist and apparently the pharmacist said that he was on borrowed time and therefore he restarted his pirfenidone.  Today says he is tolerating it well.  Although the wife and he said that  he is sleeping a lot and he gets up late.  It is unclear if this got worse after the pirfenidone start.  It seems like it has been going on for quite a while.  Last visit I also told him to stop his lisinopril but his cough continues.  It is quite severe.  He takes Robitussin which controls it.  It is unclear if the cough is improved after stopping lisinopril the wife does say that she does not hear him cough is much but it could be because of Robitussin.  Shortness of breath appears to be the  same but the main complaint is that there is a lot of fatigue and sleepiness.  He is not using his CPAP.  I did a sit/stand test on him 10 times.  He did desaturate to 87/86%.  And he recovered with rest.  He did get a little dyspneic.  He does not have portable oxygen he does not have night oxygen.    OV 12/15/2022  Subjective:  Patient ID: Luke Golden, male , DOB: [birth year] , age 70 y.o. 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   - Esbrrit start nov/dec 2023 -> stopped feb 2024: estarted pirfenidone 08/18/2022   -QuantiFERON gold positive.  Seen Dr. Orvan Falconer 01/01/2022 and started on INH and rifampin x68-month  -Short-term memory loss and wife with significant caregiver burden  -Sleep apnea does not use CPAP  -Severe baseline chronic cough.   - Stop lisinopril March 2024. - blood eos high 400 cells/mm 10/06/22; negative IgE and RAST allergy panel - normal feno 12/15/2022  - fibrois and some emphyseya on CT   -High risk prescription pirfenidone with need for intensive therapeutic monitoring  - Esbriet/Pirfenidone requires intensive drug monitoring due to high concerns for Adverse effects of , including  Drug Induced Liver Injury, significant GI side effects that include but not limited to Diarrhea, Nausea, Vomiting,  and other system side effects that include Fatigue, headaches, weight loss and other side effects such as skin rash. These will be monitored with  blood work such as LFT initially once a month for 6 months and then  quarterly   12/15/2022 -   Chief Complaint  Patient presents with   Follow-up    Pft, ct f/u,      HPI ALOYS HUPFER 77 y.o. -returns for follow-up.  Presents with his wife.  Wife is independent historian in fact most of the historian.   IPF: Dyspnea stable.  Pulmonary function test stable.  Did CT scan of the chest personally visualized and agree.  IPF itself is stable.  He has been on pirfenidone for over 6 months at this point.  He and his wife are pleased to hear this  High risk prescription with pirfenidone: Requires intensive therapeutic monitoring because of significant GI side effects and drug-induced liver injury.  He will have liver test today.  Of note wife is concerned that office delayed in getting the approval from the [facility].  The last prescription for the pirfenidone he has is today.  After  this he is getting a overnight shipment.  I have sent a message to the pharmacy team to see if he can do any improvement in process   Cough: Continues to be problematic.  Cough is severe despite stopping lisinopril last visit.  Allergy panel was negative.  But his blood eosinophils are high although his nitric oxide test today is normal.  There is some mild emphysema.  We talked about empirically covering this with inhaled corticosteroid/long-acting beta agonist.  He is open to this idea.  I communicated this with the pharmacy team because they have to get approval from the [place].  If this does not work we could try gabapentin or Stiolto.  Last resort would be chronic prednisone.  Biologic against eosinophils would be an option but would be very tough to get approval   Dyspnea: With exercise hypoxemia this is stable.  At last visit he had excess hypoxemia with sit/stand test but they could not reproduce it at the [place] and the [place] refused to give him oxygen.  Will get him to pulmonary rehabilitation.  Even this the approval from the [place] has been delayed but if he can do this then we will  test his exercise hypoxemia test at the rehab and we can reassess his oxygen need at that time  Fatigue: This continues to be problematic I do not think pirfenidone is made this worse.  His fatigue scores are significantly high.  Will see what happens with pulmonary rehabilitation.  SYMPTOM SCALE - ILD 01/27/2022 04/21/2022  08/18/2022 225# - esbreit stoppd x 1 oth 10/06/2022 Back on esbriet since last OV 12/15/2022 Esbriet [facility] refused o2 [facility] yewt to authorize rehab  Current weight       O2 use ra ra  ra ra  Shortness of Breath 0 -> 5 scale with 5 being worst (score 6 If unable to do)      At rest 2 2 2 5 3   Simple tasks - showers, clothes change, eating, shaving 2 1 3 3 3   Household (dishes, doing bed, laundry) 3 2 4 3 3   Shopping 4.5 2 3 3 4   Walking level at own pace 4 1 4 3 4   Walking up Stairs 5 2 4 4 4   Total (30-36) Dyspnea Score 20.5 11 20 21 21   How bad is your cough? 2 2 4 4  - off ace inhibbit Feno 8 ppbc, cug 5, start breo empiric  How bad is your fatigue 4 intermitten 4 0 4  How bad is nausea 0 0 0 0 0  How bad is vomiting?  0 0 0 0 0  How bad is diarrhea? 0 0 0 0   How bad is anxiety? 2 3 4 2  0  How bad is depression 4 3 4 4 2   Any chronic pain - if so where and how bad x x x x 2       Simple office walk 185 feet x  3 laps goal with forehead probe 11/18/2021  04/21/2022  10/06/2022  12/15/2022 FENO 8ppbc  O2 used ra ra ra   Number laps completed 3 3 2    Comments about pace Mod pace mod Sist stand x 10   Resting Pulse Ox/HR 100%% and 67/min 100% and 65 98% and HR 78   Final Pulse Ox/HR 98% and 107/min 99% and HR 98 87% and HR 91   Desaturated </= 88% no no yes   Desaturated <= 3% points no no yes   Got  Tachycardic >/= 90/min yes yes yes   Symptoms at end of test None none 3 of 10 dyspnea   Miscellaneous comments x  [facility] refused o2 Rx     CT Chest data from date: 12/14/22  - personally visualized and independently interpreted : yes - my findings are: same as  below   LAB RESULTS last 96 hours CT Chest High Resolution  Result Date: 12/14/2022 CLINICAL DATA:  Idiopathic pulmonary fibrosis. EXAM: CT CHEST WITHOUT CONTRAST TECHNIQUE: Multidetector CT imaging of the chest was performed following the standard protocol without intravenous contrast. High resolution imaging of the lungs, as well as inspiratory and expiratory imaging, was performed. RADIATION DOSE REDUCTION: This exam was performed according to the departmental dose-optimization program which includes automated exposure control, adjustment of the mA and/or kV according to patient size and/or use of iterative reconstruction technique. COMPARISON:  12/08/2021 FINDINGS: Cardiovascular: Normal heart size. No pericardial effusions. Normal caliber thoracic aorta. Calcification of the aorta and coronary arteries. Mediastinum/Nodes: Thyroid gland is unremarkable. Esophagus is decompressed. No significant lymphadenopathy. Calcified right hilar and mediastinal lymph nodes are present consistent with postinflammatory change. Lungs/Pleura: Subpleural fibrosis and emphysematous change most prominent in the peripheral and basilar regions consistent with the history of usual interstitial pneumonitis. No significant airspace or ground-glass changes to suggest active alveolitis. Appearances are similar to prior study without significant radiographic progression. Calcified granulomas in the lungs. No significant pulmonary nodule or consolidation. Mild cylindrical bronchiectasis with bronchial wall thickening. Upper Abdomen: No acute abnormality. Musculoskeletal: Degenerative changes in the spine. No destructive bone lesions. Old rib fractures. IMPRESSION: 1. 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 No significant progression since previous study. 2. Mild bronchiectasis and bronchial wall thickening. 3. Aortic atherosclerosis. Electronically Signed   By:  Burman Nieves M.D.   On: 12/14/2022 15:10    LAB RESULTS last 90 days Recent Results (from the past 2160 hour(s))  Hepatic function panel     Status: None   Collection Time: 10/06/22  2:11 PM  Result Value Ref Range   Total Bilirubin 0.4 0.2 - 1.2 mg/dL   Bilirubin, Direct 0.1 0.0 - 0.3 mg/dL   Alkaline Phosphatase 90 39 - 117 U/L   AST 26 0 - 37 U/L   ALT 16 0 - 53 U/L   Total Protein 7.7 6.0 - 8.3 g/dL   Albumin 4.4 3.5 - 5.2 g/dL  Perennial allergen profile IgE     Status: None   Collection Time: 10/06/22  2:11 PM  Result Value Ref Range   Class Description Allergens Comment     Comment:     Levels of Specific IgE       Class  Description of Class     ---------------------------  -----  --------------------                    < 0.10         0         Negative            0.10 -    0.31         0/I       Equivocal/Low            0.32 -    0.55         I         Low            0.56 -  1.40         II        Moderate            1.41 -    3.90         III       High            3.91 -   19.00         IV        Very High           19.01 -  100.00         V         Very High                   >100.00         VI        Very High    D Pteronyssinus IgE <0.10 Class 0 kU/L   D Farinae IgE <0.10 Class 0 kU/L   Cat Dander IgE <0.10 Class 0 kU/L   Dog Dander IgE <0.10 Class 0 kU/L   Cow Dander IgE <0.10 Class 0 kU/L   Goose Feathers IgE <0.10 Class 0 kU/L   Chicken Feathers IgE <0.10 Class 0 kU/L   Duck Feathers IgE <0.10 Class 0 kU/L   Penicillium Chrysogen IgE <0.10 Class 0 kU/L   Cladosporium Herbarum IgE <0.10 Class 0 kU/L   Aspergillus Fumigatus IgE <0.10 Class 0 kU/L   Mucor Racemosus IgE <0.10 Class 0 kU/L   Candida Albicans IgE <0.10 Class 0 kU/L   Alternaria Alternata IgE <0.10 Class 0 kU/L   Setomelanomma Rostrat <0.10 Class 0 kU/L   Aureobasidi Pullulans IgE <0.10 Class 0 kU/L   Phoma Betae IgE <0.10 Class 0 kU/L   Stemphylium Herbarum IgE <0.10 Class 0 kU/L   Mouse  Urine IgE <0.10 Class 0 kU/L  IgE     Status: None   Collection Time: 10/06/22  2:11 PM  Result Value Ref Range   IgE (Immunoglobulin E), Serum <2 <OR=114 kU/L  CBC w/Diff     Status: Abnormal   Collection Time: 10/06/22  2:11 PM  Result Value Ref Range   WBC 7.8 4.0 - 10.5 K/uL   RBC 4.46 4.22 - 5.81 Mil/uL   Hemoglobin 14.9 13.0 - 17.0 g/dL   HCT 16.1 09.6 - 04.5 %   MCV 96.6 78.0 - 100.0 fl   MCHC 34.6 30.0 - 36.0 g/dL   RDW 40.9 81.1 - 91.4 %   Platelets 173.0 150.0 - 400.0 K/uL   Neutrophils Relative % 67.7 43.0 - 77.0 %   Lymphocytes Relative 19.5 12.0 - 46.0 %   Monocytes Relative 6.2 3.0 - 12.0 %   Eosinophils Relative 5.4 (H) 0.0 - 5.0 %   Basophils Relative 1.2 0.0 - 3.0 %   Neutro Abs 5.3 1.4 - 7.7 K/uL   Lymphs Abs 1.5 0.7 - 4.0 K/uL   Monocytes Absolute 0.5 0.1 - 1.0 K/uL   Eosinophils Absolute 0.4 0.0 - 0.7 K/uL   Basophils Absolute 0.1 0.0 - 0.1 K/uL  Pulmonary function test     Status: None (Preliminary result)   Collection Time: 12/15/22  9:51 AM  Result Value Ref Range   FVC-Pre 3.49 L   FVC-%Pred-Pre 91 %   FEV1-Pre 2.81 L   FEV1-%Pred-Pre 102 %   FEV6-Pre 3.49 L   FEV6-%Pred-Pre 97 %   Pre FEV1/FVC ratio 81 %   FEV1FVC-%Pred-Pre  111 %   Pre FEV6/FVC Ratio 100 %   FEV6FVC-%Pred-Pre 107 %   FEF 25-75 Pre 2.90 L/sec   FEF2575-%Pred-Pre 149 %   DLCO unc 16.34 ml/min/mmHg   DLCO unc % pred 70 %   DLCO cor 16.34 ml/min/mmHg   DLCO cor % pred 70 %   DL/VA 6.01 ml/min/mmHg/L   DL/VA % pred 78 %  POCT EXHALED NITRIC OXIDE     Status: Normal   Collection Time: 12/15/22 11:41 AM  Result Value Ref Range   FeNO level (ppb) 8      PFT     Latest Ref Rng & Units 12/15/2022    9:51 AM 04/21/2022    8:41 AM 12/22/2021   11:50 AM  PFT Results  FVC-Pre L 3.49  P 3.73  3.80   FVC-Predicted Pre % 91  P 96  98   Pre FEV1/FVC % % 81  P 83  83   FEV1-Pre L 2.81  P 3.11  3.13   FEV1-Predicted Pre % 102  P 112  112   DLCO uncorrected ml/min/mmHg 16.34  P  18.04  17.19   DLCO UNC% % 70  P 76  73   DLCO corrected ml/min/mmHg 16.34  P 18.04  17.19   DLCO COR %Predicted % 70  P 76  73   DLVA Predicted % 78  P 80  81   TLC L   5.98   TLC % Predicted %   89   RV % Predicted %   91     P Preliminary result       has a past medical history of Anxiety, Arthritis, Headache(784.0), History of colonic polyps, Hypertension, Peripheral vascular disease (HCC), Pneumonia, Pneumothorax, Pulmonary fibrosis (HCC), Short-term memory loss, and Shortness of breath.   reports that he quit smoking about 26 years ago. His smoking use included cigarettes and cigars. He started smoking about 58 years ago. He has a 48 pack-year smoking history. He has quit using smokeless tobacco.  His smokeless tobacco use included chew.  Past Surgical History:  Procedure Laterality Date   CARDIAC CATHETERIZATION     2012 - [place],medical therapy- as follow up    COLONOSCOPY W/ BIOPSIES AND POLYPECTOMY     Hx; of   EYE SURGERY     cataracts removed - /w IOL- bilateral    FRACTURE SURGERY     R elbow- 2000   JOINT REPLACEMENT Bilateral    TONSILLECTOMY     as an adult    TOTAL KNEE ARTHROPLASTY  12/23/2011   Procedure: TOTAL KNEE ARTHROPLASTY;  Surgeon: Loreta Ave, MD;  Location: [facility] OR;  Service: Orthopedics;  Laterality: Left;   TOTAL KNEE ARTHROPLASTY Right 07/12/2013   Procedure: TOTAL KNEE ARTHROPLASTY;  Surgeon: Loreta Ave, MD;  Location: [facility] OR;  Service: Orthopedics;  Laterality: Right;    Allergies  Allergen Reactions   Lisinopril Cough   Cephalosporins Hives   Tylenol [Acetaminophen] Other (See Comments)    Feel weird      Immunization History  Administered Date(s) Administered   DTaP 09/01/2013   Fluad Quad(high Dose 65+) 05/07/2020   Influenza, High Dose Seasonal PF 04/27/2016, 04/13/2017, 04/03/2022   Influenza-Unspecified 08/10/2012, 03/14/2014   Pfizer Covid-19 Vaccine Bivalent Booster 41yrs & up 01/01/2022   Pneumococcal  Conjugate-13 07/20/2014   Pneumococcal-Unspecified 03/22/2012   Rsv, Bivalent, Protein Subunit Rsvpref,pf Verdis Frederickson) 05/13/2022   Td 12/26/2021   Tdap 03/22/2012   Zoster Recombinant(Shingrix) 12/29/2017, 03/17/2018  Family History  Problem Relation Age of Onset   Heart disease Mother    Heart disease Father    Heart disease Brother    Colon cancer Neg Hx    Colon polyps Neg Hx    Esophageal cancer Neg Hx    Pancreatic cancer Neg Hx    Stomach cancer Neg Hx      Current Outpatient Medications:    albuterol (VENTOLIN HFA) 108 (90 Base) MCG/ACT inhaler, Inhale 2 puffs into the lungs every 6 (six) hours as needed for wheezing or shortness of breath., Disp: 8 g, Rfl: 2   amLODipine (NORVASC) 10 MG tablet, Take 10 mg by mouth daily., Disp: , Rfl:    aspirin EC 81 MG tablet, Take 81 mg by mouth daily., Disp: , Rfl:    divalproex (DEPAKOTE) 250 MG DR tablet, Take 250 mg by mouth., Disp: , Rfl:    famotidine (PEPCID) 20 MG tablet, Take 1 tablet (20 mg total) by mouth at bedtime., Disp: 30 tablet, Rfl: 5   fluticasone furoate-vilanterol (BREO ELLIPTA) 100-25 MCG/ACT AEPB, Inhale 1 puff into the lungs daily., Disp: 60 each, Rfl: 5   memantine (NAMENDA) 10 MG tablet, TAKE ONE TABLET BY MOUTH AT BEDTIME TO SLOW MEMORY LOSS, Disp: , Rfl:    omeprazole (PRILOSEC) 40 MG capsule, Take 1 capsule (40 mg total) by mouth daily. To be taken 30 minutes before breakfast, Disp: 90 capsule, Rfl: 1   Pirfenidone (ESBRIET) 267 MG TABS, Take 3 tablets (801 mg total) by mouth 3 (three) times daily with meals., Disp: 810 tablet, Rfl: 1   rosuvastatin (CRESTOR) 20 MG tablet, Take 10 mg by mouth daily. , Disp: , Rfl:    sildenafil (VIAGRA) 100 MG tablet, TAKE ONE TABLET BY MOUTH AS DIRECTED (TAKE 1 HOUR PRIOR TO SEXUAL ACTIVITY *DO NOT EXCEED 1 DOSE PER 24 HOUR PERIOD*), Disp: , Rfl:    simethicone (MYLICON) 80 MG chewable tablet, , Disp: , Rfl:    busPIRone (BUSPAR) 10 MG tablet, Take 15-30 mg by mouth See admin  instructions. Take 15 mg by mouth in the morning and take 30 mg by mouth at bedtime (Patient not taking: Reported on 12/15/2022), Disp: , Rfl:       Objective:   Vitals:   12/15/22 1059  BP: 134/70  Pulse: (!) 58  Temp: 97.7 F (36.5 C)  TempSrc: Oral  SpO2: 97%  Weight: 221 lb (100.2 kg)  Height: 5\' 8"  (1.727 m)    Estimated body mass index is 33.6 kg/m as calculated from the following:   Height as of this encounter: 5\' 8"  (1.727 m).   Weight as of this encounter: 221 lb (100.2 kg).  @WEIGHTCHANGE @  [employer]   12/15/22 1059  Weight: 221 lb (100.2 kg)     Physical Exam   General: No distress. Looks well O2 at rest: no Cane present: no Sitting in wheel chair: no Frail: no Obese: no Neuro: Alert and Oriented x 3. GCS 15. Speech normal Psych: Pleasant Resp:  Barrel Chest - no.  Wheeze - no, Crackles - YES some, No overt respiratory distress CVS: Normal heart sounds. Murmurs - no Ext: Stigmata of Connective Tissue Disease - no HEENT: Normal upper airway. PEERL +. No post nasal drip        Assessment:       ICD-10-CM   1. IPF (idiopathic pulmonary fibrosis) (HCC)  J84.112 POCT EXHALED NITRIC OXIDE    AMB referral to pulmonary rehabilitation    Hepatic  function panel    Hepatic function panel    2. Agent orange exposure  Z77.098     3. Medication monitoring encounter  Z51.81 Hepatic function panel    Hepatic function panel    4. Pulmonary emphysema, unspecified emphysema type (HCC)  J43.9     5. Cough variant asthma  J45.991          Plan:     Patient Instructions     ICD-10-CM   1. IPF (idiopathic pulmonary fibrosis) (HCC)  J84.112     2. Agent orange exposure  Z77.098     3. Medication monitoring encounter  Z51.81     4. Cough due to ACE inhibitor  R05.8    T46.4X5A       #IPF  - Clinically stable on pulmonary function testing and also CT scan of the chest x 1 year -Overall tolerating pirfenidone well but there is some fatigue  which might or might not be related to pirfenidone -Noted that the [place] did not think you had access hypoxemia and refused to give you oxygen  Plan - Check liver function test today 12/15/2022 -Continue pirfenidone at full dose but make sure apply sunscreen and space the medication at least 5 to 6 hours apart  -I have sent a message to the pharmacy team to make sure your refills for pirfenidone do not have any gap -Re- REFER pulmonary rehabilitation  -If there is desaturations at pulm rehabilitation then we can definitely make a case again for portable oxygen  -Unclear why there is still a [facility] with [facility]     #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 Marchelle Gearing   -Symptoms: Walking desaturation test at follow-up  = Return sooner if needed   FOLLOWUP Return in about 14 weeks (around 03/23/2023) for 30 min visit, ILD, after Cleda Daub and DLCO, with Dr Marchelle Gearing, Face to Face Visit.    SIGNATURE    Dr. Kalman Shan, M.D., F.C.C.P,  Pulmonary and Critical Care Medicine Staff Physician, [facility] Director - Interstitial Lung Disease  Program  Pulmonary Fibrosis [facility] at [facility] [place], [place], [address]  Pager: [phone], If no answer or between  15:00h - 7:00h: call [phone] Telephone: [phone]  11:59  AM 12/15/2022   HIGh Complexity  OFFICE   2021 E/M guidelines, first released in 2021, with minor revisions added in 2023. Must meet the requirements for 2 out of 3 dimensions to qualify.    Number and complexity of problems addressed Amount and/or complexity of data reviewed Risk of complications and/or morbidity  Severe exacerbation of chronic illness  Acute or chronic illnesses that may pose a threat to life or bodily function, e.g., multiple trauma, acute MI, pulmonary embolus, severe respiratory distress, progressive rheumatoid arthritis, psychiatric illness with potential threat to self or others, peritonitis,  acute renal failure, abrupt change in neurological status Must meet the requirements for 2 of 3 of the categories)  Category 1: Tests and documents, historian  Any combination of 3 of the following:  Assessment requiring an independent historian  R  Review of results of each unique test  Ordering of each unique test    Category 2: Interpretation of tests    Independent interpretation of a test performed by another physician/other qualified health care professional (not separately reported)  Category 3: Discuss management/tests  Discussion of management or test interpretation with external physician/other qualified health care professional/appropriate source (not separately reported)  HIGH risk of morbidity from additional diagnostic testing or treatment Examples only:  Drug therapy requiring intensive monitoring for toxicity  Decision for elective major surgery with identified pateint or procedure risk factors  Decision regarding hospitalization or escalation of level of care  Decision for DNR or to de-escalate care   Parenteral controlled  substances

## 2022-12-15 NOTE — Patient Instructions (Signed)
 Spiro/DLCO performed today. 

## 2022-12-15 NOTE — Telephone Encounter (Signed)
Sending BIV request for ICS/LABA to pharmacy PA team.  Pirfenidone was shipped overnight because I was OOO. Refill request had been routed back to triage with note that I was OO) but apparently was routed back to [place] again - leading to delay. Sorry about that!  Chesley Mires, PharmD, MPH, BCPS, CPP Clinical Pharmacist (Rheumatology and Pulmonology)

## 2022-12-15 NOTE — Telephone Encounter (Signed)
Rx team  1) starting him on some Breo because of cough variant asthma.  It appears it needs to get approved through the [facility].  In any inhaled corticosteroid/long-acting beta agonist inhaler such as Symbicort or Dulera or Advair will do.  Please facilitate  2) his pirfenidone is getting shipped overnight.  They want to make sure that we are not delaying

## 2022-12-17 ENCOUNTER — Other Ambulatory Visit (HOSPITAL_COMMUNITY): Payer: Self-pay

## 2022-12-17 NOTE — Telephone Encounter (Signed)
No retail pharmacy coverage found, patient will have to [facility] with [facility] to see about preferred alternatives.

## 2022-12-18 NOTE — Telephone Encounter (Signed)
Closing encounter Message has been handled in another encounter

## 2022-12-18 NOTE — Telephone Encounter (Signed)
Called and spoke with [place].  Patient has been approved for Pirfenidone through the [place] and medications has been received.  Nothing further at this time.

## 2022-12-30 ENCOUNTER — Encounter (HOSPITAL_COMMUNITY): Payer: Self-pay

## 2022-12-31 ENCOUNTER — Telehealth: Payer: Self-pay | Admitting: Internal Medicine

## 2022-12-31 NOTE — Telephone Encounter (Signed)
What medication is this for?

## 2022-12-31 NOTE — Telephone Encounter (Signed)
Called to speak with someone regarding a PA that was faxed over.  Please call to discuss at [phone], ext, [phone]

## 2023-01-01 NOTE — Telephone Encounter (Signed)
I'm assuming the medication is Esbriet? That was the most recent to be prescribed

## 2023-01-04 NOTE — Telephone Encounter (Signed)
Routed to Specialty Team.

## 2023-01-04 NOTE — Telephone Encounter (Signed)
Ext [phone] Pt PA is for Luke Golden is calling back to see has anything been received ?

## 2023-01-05 ENCOUNTER — Encounter: Payer: Self-pay | Admitting: Internal Medicine

## 2023-01-05 ENCOUNTER — Other Ambulatory Visit (HOSPITAL_COMMUNITY): Payer: Self-pay

## 2023-01-05 DIAGNOSIS — J84112 Idiopathic pulmonary fibrosis: Secondary | ICD-10-CM

## 2023-01-05 MED ORDER — PIRFENIDONE 267 MG PO TABS
801.0000 mg | ORAL_TABLET | Freq: Three times a day (TID) | ORAL | 1 refills | Status: DC
Start: 2023-01-05 — End: 2023-04-06

## 2023-01-05 NOTE — Telephone Encounter (Signed)
Patient is insured through the [place], we are unable to complete PA. Office will have to complete form with the [place]. Nothing has been received at this time.

## 2023-01-11 ENCOUNTER — Encounter (HOSPITAL_COMMUNITY): Payer: Self-pay

## 2023-01-11 ENCOUNTER — Encounter (HOSPITAL_COMMUNITY)
Admission: RE | Admit: 2023-01-11 | Discharge: 2023-01-11 | Disposition: A | Discharge status: Home / Self Care | Payer: No Typology Code available for payment source | Source: Ambulatory Visit | Attending: Internal Medicine | Admitting: Internal Medicine

## 2023-01-11 VITALS — BP 148/70 | HR 59 | Wt 223.5 lb

## 2023-01-11 DIAGNOSIS — Z5189 Encounter for other specified aftercare: Secondary | ICD-10-CM | POA: Insufficient documentation

## 2023-01-11 DIAGNOSIS — J84112 Idiopathic pulmonary fibrosis: Secondary | ICD-10-CM | POA: Diagnosis present

## 2023-01-11 DIAGNOSIS — Z87891 Personal history of nicotine dependence: Secondary | ICD-10-CM | POA: Diagnosis not present

## 2023-01-11 HISTORY — DX: Cerebral infarction, unspecified: I63.9

## 2023-01-11 NOTE — Progress Notes (Signed)
Luke Golden 77 y.o. male Pulmonary Rehab Orientation Note This patient who was referred to Pulmonary Rehab by Dr. Marchelle Gearing with the diagnosis of IPF arrived today in Cardiac and Pulmonary Rehab. He arrived ambulatory with assistive device with ataxic gait. He does not carry portable oxygen. Per patient, Luke Golden uses oxygen never. Color good, skin warm and dry. Patient is oriented to time and place. Patient's medical history, psychosocial health, and medications reviewed. Psychosocial assessment reveals patient lives with spouse. Luke Golden is currently retired. Patient hobbies include watching tv and working on cars and welding . Patient reports his stress level is low. Areas of stress/anxiety include health. Patient does exhibit signs of depression. Signs of depression include fatigue. Luke Golden states that he feels his depression is stable at this time. PHQ2/9 score 5/15. Luke Golden shows good  coping skills with positive outlook on life. Offered emotional support and reassurance. Will continue to monitor. Physical assessment performed by Nurse pick: Luke Lineman RN. Please see their orientation physical assessment note. Luke Golden reports he  does take medications as prescribed. Patient states he  follows a regular  diet. The patient reports no specific efforts to gain or lose weight.. Patient's weight will be monitored closely. Demonstration and practice of PLB using pulse oximeter. Anterio able to return demonstration satisfactorily. Safety and hand hygiene in the exercise area reviewed with patient. Luke Golden voices understanding of the information reviewed. Department expectations discussed with patient and achievable goals were set. The patient shows enthusiasm about attending the program and we look forward to working with Luke Golden. Luke Golden completed a 6 min walk test today and is scheduled to begin exercise on 01/21/23@ 1:15 .   [id number] Guss Bunde, BSRT
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
## 2023-01-11 NOTE — Progress Notes (Signed)
Pulmonary Rehab Orientation Physical Assessment Note  Physical assessment reveals  Pt is alert and oriented x 3.  Heart rate is normal. Lung assessment completed by Durel Salts RT. Reports non-productive cough. Bowel sounds present.  Pt denies abdominal discomfort, nausea, vomiting or diarrhea. Grip strength equal, strong. Distal pulses palpable with bilateral swelling to the lower extremities with the left greater than right.  Typically wears compression socks however he does not have them on today. Luke Golden, BSN Cardiac and Emergency planning/management officer

## 2023-01-21 ENCOUNTER — Encounter (HOSPITAL_COMMUNITY): Payer: No Typology Code available for payment source

## 2023-01-21 ENCOUNTER — Telehealth (HOSPITAL_COMMUNITY): Payer: Self-pay | Admitting: *Deleted

## 2023-01-21 NOTE — Telephone Encounter (Signed)
Called pt to check in since he did not come to his first day of exercise. Wife sts he fell at the dentist today. Not seriously hurt. He was not using his RW. Wife sts he will come to class next Tuesday. Ethelda Chick BS, ACSM-CEP 01/21/2023 3:46 PM

## 2023-01-26 ENCOUNTER — Encounter (HOSPITAL_COMMUNITY)
Admission: RE | Admit: 2023-01-26 | Discharge: 2023-01-26 | Disposition: A | Discharge status: Home / Self Care | Payer: No Typology Code available for payment source | Source: Ambulatory Visit | Attending: Internal Medicine | Admitting: Internal Medicine

## 2023-01-26 DIAGNOSIS — Z5189 Encounter for other specified aftercare: Secondary | ICD-10-CM | POA: Diagnosis not present

## 2023-01-26 DIAGNOSIS — J84112 Idiopathic pulmonary fibrosis: Secondary | ICD-10-CM

## 2023-01-26 NOTE — Progress Notes (Signed)
Daily Session Note  Patient Details  Name: Luke Golden MRN: [id number] Date of Birth: 1946/02/26 Referring Provider:   Doristine Devoid Pulmonary Rehab Walk Test from 01/11/2023 in [facility]  Referring Provider Ramaswamy       Encounter Date: 01/26/2023  Check In:  Session Check In - 01/26/23 1332       Check-In   Supervising physician immediately available to respond to emergencies [facility] MD immediately available    Physician(s) Carlyon Shadow, NP    Location [facility]    Staff Present Samantha Belarus, RD, Dutch Gray, RN, BSN;Randi Reeve BS, ACSM-CEP, Exercise Physiologist;Kaylee Earlene Plater, MS, ACSM-CEP, Exercise Physiologist;Casey Katrinka Blazing, RT    Virtual Visit No    Medication changes reported     No    Fall or balance concerns reported    No    Tobacco Cessation No Change    Warm-up and Cool-down Performed as group-led instruction    Resistance Training Performed Yes    VAD Patient? No    PAD/SET Patient? No      Pain Assessment   Currently in Pain? No/denies    Multiple Pain Sites No             Capillary Blood Glucose: No results found for this or any previous visit (from the past 24 hour(s)).    Social History   Tobacco Use  Smoking Status Former   Current packs/day: 0.00   Average packs/day: 1.5 packs/day for 32.0 years (48.0 ttl pk-yrs)   Types: Cigarettes, Cigars   Start date: 42   Quit date: 1998   Years since quitting: 26.6  Smokeless Tobacco Former   Types: Chew    Goals Met:  Exercise tolerated well No report of concerns or symptoms today Strength training completed today  Goals Unmet:  Not Applicable  Comments: Service time is from 1311 to 1441    Dr. Mechele Collin is Medical Director for Pulmonary Rehab at [facility].

## 2023-01-28 ENCOUNTER — Encounter (HOSPITAL_COMMUNITY)
Admission: RE | Admit: 2023-01-28 | Discharge: 2023-01-28 | Disposition: A | Discharge status: Home / Self Care | Payer: No Typology Code available for payment source | Source: Ambulatory Visit | Attending: Internal Medicine | Admitting: Internal Medicine

## 2023-01-28 VITALS — Wt 221.8 lb

## 2023-01-28 DIAGNOSIS — Z5189 Encounter for other specified aftercare: Secondary | ICD-10-CM | POA: Diagnosis not present

## 2023-01-28 DIAGNOSIS — J84112 Idiopathic pulmonary fibrosis: Secondary | ICD-10-CM

## 2023-01-28 NOTE — Progress Notes (Signed)
Daily Session Note  Patient Details  Name: Luke Golden MRN: [id number] Date of Birth: [birth year] Referring Provider:   Doristine Devoid Pulmonary Rehab Walk Test from 01/11/2023 in [facility]  Referring Provider Ramaswamy       Encounter Date: 01/28/2023  Check In:  Session Check In - 01/28/23 1337       Check-In   Supervising physician immediately available to respond to emergencies [facility] MD immediately available    Physician(s) Bernadene Person, NP    Location [facility]    Staff Present Elissa Lovett BS, ACSM-CEP, Exercise Physiologist;Kaylee Earlene Plater, MS, ACSM-CEP, Exercise Physiologist;Evvie Behrmann Thedore Mins, RN, BSN    Virtual Visit No    Medication changes reported     No    Fall or balance concerns reported    No    Tobacco Cessation No Change    Warm-up and Cool-down Performed as group-led instruction    Resistance Training Performed Yes    VAD Patient? No    PAD/SET Patient? No      Pain Assessment   Currently in Pain? No/denies    Multiple Pain Sites No             Capillary Blood Glucose: No results found for this or any previous visit (from the past 24 hour(s)).    Social History   Tobacco Use  Smoking Status Former   Current packs/day: 0.00   Average packs/day: 1.5 packs/day for 32.0 years (48.0 ttl pk-yrs)   Types: Cigarettes, Cigars   Start date: 47   Quit date: 1998   Years since quitting: 26.6  Smokeless Tobacco Former   Types: Chew    Goals Met:  Proper associated with RPD/PD & O2 Sat Independence with exercise equipment Exercise tolerated well No report of concerns or symptoms today Strength training completed today  Goals Unmet:  Not Applicable  Comments: Service time is from 1321 to 1445.    Dr. Mechele Collin is Medical Director for Pulmonary Rehab at [facility].

## 2023-02-02 ENCOUNTER — Encounter (HOSPITAL_COMMUNITY): Payer: Self-pay

## 2023-02-02 ENCOUNTER — Encounter (HOSPITAL_COMMUNITY): Payer: No Typology Code available for payment source

## 2023-02-03 NOTE — Progress Notes (Signed)
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 the program and follow exercise prescription;Relaxation education    Comments Jisaiah scored high on his PHQ9 screening and  states he feels like he does have some depression at this time. 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 He is currently working with a therapist and using psychotropic medications. He also relies on his faith to help him through  hard times.    Expected Outcomes For Inman to participate in PR free of any psychosocial barriers or concerns    Continue Psychosocial Services  Follow up required by staff              Psychosocial Re-Evaluation:  Psychosocial Re-Evaluation     Row Name 01/27/23 1351             Psychosocial Re-Evaluation   Current issues with Current Psychotropic Meds;Current Depression;History of Depression       Comments No changes since Luke Golden's orientation. Luke Golden has attended 1 class so far. Luke Golden denies any needs at this time.       Expected Outcomes For Luke Golden to reduce symptoms of depression & attend PR without any psychosocial barriers or concerns       Interventions Encouraged to attend Pulmonary Rehabilitation for the exercise       Continue Psychosocial Services  Follow up required by staff                Psychosocial Discharge (Final Psychosocial Re-Evaluation):  Psychosocial Re-Evaluation - 01/27/23 1351       Psychosocial Re-Evaluation   Current issues with Current Psychotropic Meds;Current Depression;History of Depression    Comments No changes since Luke Golden's orientation. Luke Golden has attended 1 class so far. Luke Golden denies any needs at this time.    Expected Outcomes For Luke Golden to reduce symptoms of depression & attend PR without any psychosocial barriers or concerns    Interventions Encouraged to attend Pulmonary Rehabilitation for the exercise    Continue Psychosocial Services  Follow up required by staff             Education: Education Goals: Education classes will be provided on a weekly basis, covering required topics. Participant will state understanding/return demonstration of topics presented.  Learning Barriers/Preferences:  Learning Barriers/Preferences - 01/11/23 1321       Learning Barriers/Preferences   Learning Barriers Hearing    Learning Preferences Group Instruction;Individual Instruction             Education Topics: Introduction to Pulmonary Rehab Group instruction provided by PowerPoint, verbal discussion, and written material to support subject matter. Instructor reviews what Pulmonary Rehab is, the purpose  of the program, and how patients are referred.     Know Your Numbers Group instruction that is supported by a PowerPoint presentation. Instructor discusses importance of knowing and understanding resting, exercise, and post-exercise oxygen saturation, heart rate, and blood pressure. Oxygen saturation, heart rate, blood pressure, rating of perceived exertion, and dyspnea are reviewed along with a normal range for these values.    Exercise for the Pulmonary Patient Group instruction that is supported by a PowerPoint presentation. Instructor discusses benefits of exercise, core components of exercise, frequency, duration, and intensity of an exercise routine, importance of utilizing pulse oximetry during exercise, safety while exercising, and options of places to exercise outside of rehab.       MET Level  Group instruction provided by PowerPoint, verbal discussion, and written material to support subject matter. Instructor reviews what METs are and how to increase METs.    Pulmonary Medications Verbally interactive group education provided by instructor with focus on inhaled medications and proper administration.   Anatomy and Physiology of the Respiratory System Group instruction provided by PowerPoint, verbal discussion, and written material to support subject matter. Instructor reviews respiratory cycle and anatomical components of the respiratory system and their functions. Instructor also reviews differences in obstructive and restrictive respiratory diseases with  examples of each.    Oxygen Safety Group instruction provided by PowerPoint, verbal discussion, and written material to support subject matter. There is an overview of "What is Oxygen" and "Why do we need it".  Instructor also reviews how to create a safe environment for oxygen use, the importance of using oxygen as prescribed, and the risks of noncompliance. There is a brief discussion on traveling with oxygen and resources  the patient may utilize.   Oxygen Use Group instruction provided by PowerPoint, verbal discussion, and written material to discuss how supplemental oxygen is prescribed and different types of oxygen supply systems. Resources for more information are provided.    Breathing Techniques Group instruction that is supported by demonstration and informational handouts. Instructor discusses the benefits of pursed lip and diaphragmatic breathing and detailed demonstration on how to perform both.     Risk Factor Reduction Group instruction that is supported by a PowerPoint presentation. Instructor discusses the definition of a risk factor, different risk factors for pulmonary disease, and how the heart and lungs work together.   MD Day A group question and answer session with a medical doctor that allows participants to ask questions that relate to their pulmonary disease state.   Nutrition for the Pulmonary Patient Group instruction provided by PowerPoint slides, verbal discussion, and written materials to support subject matter. The instructor gives an explanation and review of healthy diet recommendations, which includes a discussion on weight management, recommendations for fruit and vegetable consumption, as well as protein, fluid, caffeine, fiber, sodium, sugar, and alcohol. Tips for eating when patients are short of breath are discussed.    Other Education Group or individual verbal, written, or video instructions that support the educational goals of the pulmonary rehab program.    Knowledge Questionnaire Score:  Knowledge Questionnaire Score - 01/11/23 1400       Knowledge Questionnaire Score   Pre Score 11/18             Core Components/Risk Factors/Patient Goals at Admission:  Personal Goals and Risk Factors at Admission - 01/11/23 1321       Core Components/Risk Factors/Patient Goals on Admission    Weight Management Weight Loss;Yes    Intervention Weight Management:  Develop a combined nutrition and exercise program designed to reach desired caloric intake, while maintaining appropriate intake of nutrient and fiber, sodium and fats, and appropriate energy expenditure required for the weight goal.;Weight Management: Provide education and appropriate resources to help participant work on and attain dietary goals.;Obesity: Provide education and appropriate resources to help participant work on and attain dietary goals.    Expected Outcomes Short Term: Continue to assess and modify interventions until short term weight is achieved;Long Term: Adherence to nutrition and physical activity/exercise program aimed toward attainment of established weight goal;Weight Loss: Understanding of general recommendations for a balanced deficit meal plan, which promotes 1-2 lb weight loss per week and includes a negative energy balance of [phone] kcal/d;Understanding recommendations for meals to include 15-35% energy as protein, 25-35% energy from fat, 35-60% energy from carbohydrates, less than 200mg  of dietary cholesterol, 20-35 gm of total fiber daily    Improve shortness of breath with ADL's Yes    Intervention Provide education, individualized exercise plan and daily activity instruction to help decrease symptoms of SOB with activities of daily living.    Expected Outcomes Short Term: Improve cardiorespiratory fitness to achieve a reduction of symptoms when performing ADLs;Long Term: Be able to perform more ADLs without symptoms or delay the  onset of symptoms    Increase knowledge of respiratory medications and ability to use respiratory devices properly  Yes    Intervention Provide education and demonstration as needed of appropriate use of medications, inhalers, and oxygen therapy.    Expected Outcomes Short Term: Achieves understanding of medications use. Understands that oxygen is a medication prescribed by physician. Demonstrates appropriate use of inhaler and oxygen therapy.;Long  Term: Maintain appropriate use of medications, inhalers, and oxygen therapy.    Hypertension Yes    Intervention Provide education on lifestyle modifcations including regular physical activity/exercise, weight management, moderate sodium restriction and increased consumption of fresh fruit, vegetables, and low fat dairy, alcohol moderation, and smoking cessation.;Monitor prescription use compliance.    Expected Outcomes Short Term: Continued assessment and intervention until BP is < 140/17mm HG in hypertensive participants. < 130/30mm HG in hypertensive participants with diabetes, heart failure or chronic kidney disease.;Long Term: Maintenance of blood pressure at goal levels.             Core Components/Risk Factors/Patient Goals Review:   Goals and Risk Factor Review     Row Name 01/27/23 1423             Core Components/Risk Factors/Patient Goals Review   Personal Goals Review Weight Management/Obesity;Improve shortness of breath with ADL's;Develop more efficient breathing techniques such as purse lipped breathing and diaphragmatic breathing and practicing self-pacing with activity.;Hypertension;Increase knowledge of respiratory medications and ability to use respiratory devices properly.       Review Unable to assess goals. Luke Golden has completed 1 class so far.       Expected Outcomes For Luke Golden to lose weight, improve his shortness of breath with ADLs, develop more efficient breathing techniques, increase his knowledge of respiratory medications, and have his hypertension well controlled                Core Components/Risk Factors/Patient Goals at Discharge (Final Review):   Goals and Risk Factor Review - 01/27/23 1423       Core Components/Risk Factors/Patient Goals Review   Personal Goals Review Weight Management/Obesity;Improve shortness of breath with ADL's;Develop more efficient breathing techniques such as purse lipped breathing and diaphragmatic breathing and practicing  self-pacing with activity.;Hypertension;Increase knowledge of respiratory medications and ability to use respiratory devices properly.    Review Unable to assess goals. Luke Golden has completed 1 class so far.    Expected Outcomes For Luke Golden to lose weight, improve his shortness of breath with ADLs, develop more efficient breathing techniques, increase his knowledge of respiratory medications, and have his hypertension well controlled             ITP Comments:Pt is making expected progress toward Pulmonary Rehab goals after completing 2 sessions. Recommend continued exercise, life style modification, education, and utilization of breathing techniques to increase stamina and strength, while also decreasing shortness of breath with exertion.  Dr. Mechele Collin is Medical Director for Pulmonary Rehab at [facility].     Comments: Dr. Mechele Collin is Medical Director for Pulmonary Rehab at [facility].

## 2023-02-04 ENCOUNTER — Encounter (HOSPITAL_COMMUNITY)
Admission: RE | Admit: 2023-02-04 | Discharge: 2023-02-04 | Disposition: A | Discharge status: Home / Self Care | Payer: No Typology Code available for payment source | Source: Ambulatory Visit | Attending: Internal Medicine | Admitting: Internal Medicine

## 2023-02-04 DIAGNOSIS — J84112 Idiopathic pulmonary fibrosis: Secondary | ICD-10-CM | POA: Insufficient documentation

## 2023-02-04 NOTE — Progress Notes (Signed)
Daily Session Note  Patient Details  Name: Luke Golden MRN: [id number] Date of Birth: April 10, 1946 Referring Provider:   Doristine Devoid Pulmonary Rehab Walk Test from 01/11/2023 in [facility]  Referring Provider Ramaswamy       Encounter Date: 02/04/2023  Check In:  Session Check In - 02/04/23 1409       Check-In   Supervising physician immediately available to respond to emergencies [facility] MD immediately available    Physician(s) Metta Clines, NP    Location [facility]    Staff Present Elissa Lovett BS, ACSM-CEP, Exercise Physiologist;Kaylee Earlene Plater, MS, ACSM-CEP, Exercise Physiologist;Coady Train Synthia Innocent, RN, BSN    Virtual Visit No    Medication changes reported     No    Fall or balance concerns reported    No    Tobacco Cessation No Change    Warm-up and Cool-down Performed as group-led instruction    Resistance Training Performed Yes    VAD Patient? No    PAD/SET Patient? No      Pain Assessment   Currently in Pain? No/denies    Multiple Pain Sites No             Capillary Blood Glucose: No results found for this or any previous visit (from the past 24 hour(s)).    Social History   Tobacco Use  Smoking Status Former   Current packs/day: 0.00   Average packs/day: 1.5 packs/day for 32.0 years (48.0 ttl pk-yrs)   Types: Cigarettes, Cigars   Start date: 55   Quit date: 1998   Years since quitting: 26.6  Smokeless Tobacco Former   Types: Chew    Goals Met:  Proper associated with RPD/PD & O2 Sat Independence with exercise equipment Exercise tolerated well No report of concerns or symptoms today Strength training completed today  Goals Unmet:  Not Applicable  Comments: Service time is from 1312 to 1444.    Dr. Mechele Collin is Medical Director for Pulmonary Rehab at [facility]

## 2023-02-08 ENCOUNTER — Other Ambulatory Visit: Payer: Self-pay

## 2023-02-08 DIAGNOSIS — J84112 Idiopathic pulmonary fibrosis: Secondary | ICD-10-CM

## 2023-02-08 DIAGNOSIS — R0609 Other forms of dyspnea: Secondary | ICD-10-CM

## 2023-02-09 ENCOUNTER — Encounter (HOSPITAL_COMMUNITY)
Admission: RE | Admit: 2023-02-09 | Discharge: 2023-02-09 | Disposition: A | Discharge status: Home / Self Care | Payer: No Typology Code available for payment source | Source: Ambulatory Visit | Attending: Internal Medicine

## 2023-02-09 ENCOUNTER — Ambulatory Visit (INDEPENDENT_AMBULATORY_CARE_PROVIDER_SITE_OTHER): Payer: No Typology Code available for payment source | Admitting: Internal Medicine

## 2023-02-09 DIAGNOSIS — J84112 Idiopathic pulmonary fibrosis: Secondary | ICD-10-CM

## 2023-02-09 DIAGNOSIS — R0609 Other forms of dyspnea: Secondary | ICD-10-CM

## 2023-02-09 LAB — PULMONARY FUNCTION TEST
DL/VA % pred: 77 %
DL/VA: 3.06 ml/min/mmHg/L
DLCO cor % pred: 69 %
DLCO cor: 16.1 ml/min/mmHg
DLCO unc % pred: 69 %
DLCO unc: 16.1 ml/min/mmHg
FEF 25-75 Pre: 2.41 L/s
FEF2575-%Pred-Pre: 124 %
FEV1-%Pred-Pre: 95 %
FEV1-Pre: 2.62 L
FEV1FVC-%Pred-Pre: 109 %
FEV6-%Pred-Pre: 93 %
FEV6-Pre: 3.32 L
FEV6FVC-%Pred-Pre: 107 %
FVC-%Pred-Pre: 86 %
FVC-Pre: 3.32 L
Pre FEV1/FVC ratio: 79 %
Pre FEV6/FVC Ratio: 100 %

## 2023-02-09 NOTE — Progress Notes (Signed)
Daily Session Note  Patient Details  Name: Luke Golden MRN: [id number] Date of Birth: [birth year] Referring Provider:   Doristine Devoid Pulmonary Rehab Walk Test from [facility]/11/2023 in [facility]  Referring Provider Ramaswamy       Encounter Date: 02/09/2023  Check In:  Session Check In - 02/09/23 1510       Check-In   Supervising physician immediately available to respond to emergencies [facility] MD immediately available    Physician(s) Metta Clines, NP    [facility]    Staff Present Samantha Belarus, RD, Dutch Gray, RN, BSN;Randi Reeve BS, ACSM-CEP, Exercise Physiologist;Cayenne Breault Earlene Plater, MS, ACSM-CEP, Exercise Physiologist;Casey Katrinka Blazing, RT    Virtual Visit No    Medication changes reported     No    Fall or balance concerns reported    Yes    Comments pt uses rollator for balance    Tobacco Cessation No Change    Warm-up and Cool-down Performed as group-led instruction    Resistance Training Performed Yes    VAD Patient? No    PAD/SET Patient? No      Pain Assessment   Currently in Pain? No/denies    Multiple Pain Sites No             Capillary Blood Glucose: No results found for this or any previous visit (from the past 24 hour(s)).    Social History   Tobacco Use  Smoking Status Former   Current packs/day: 0.00   Average packs/day: 1.5 packs/day for 32.0 years (48.0 ttl pk-yrs)   Types: Cigarettes, Cigars   Start date: 17   Quit date: 1998   Years since quitting: 26.7  Smokeless Tobacco Former   Types: Chew    Goals Met:  Proper associated with RPD/PD & O2 Sat Exercise tolerated well No report of concerns or symptoms today Strength training completed today  Goals Unmet:  Not Applicable  Comments: Service time is from 1310 to 1440.    Dr. Mechele Collin is Medical Director for Pulmonary Rehab at [facility].

## 2023-02-09 NOTE — Patient Instructions (Signed)
Spiro/DLCO performed today. 

## 2023-02-09 NOTE — Progress Notes (Signed)
Spiro/DLCO performed today. 

## 2023-02-11 ENCOUNTER — Encounter (HOSPITAL_COMMUNITY)
Admission: RE | Admit: 2023-02-11 | Discharge: 2023-02-11 | Disposition: A | Discharge status: Home / Self Care | Payer: No Typology Code available for payment source | Source: Ambulatory Visit | Attending: Internal Medicine | Admitting: Internal Medicine

## 2023-02-11 DIAGNOSIS — J84112 Idiopathic pulmonary fibrosis: Secondary | ICD-10-CM

## 2023-02-11 NOTE — Progress Notes (Signed)
Daily Session Note  Patient Details  Name: Luke Golden MRN: [id number] Date of Birth: [birth year] Referring Provider:   Doristine Devoid Pulmonary Rehab Walk Test from 01/11/2023 in [facility]  Referring Provider Ramaswamy       Encounter Date: 02/11/2023  Check In:  Session Check In - 02/11/23 1337       Check-In   Supervising physician immediately available to respond to emergencies [facility] MD immediately available    Physician(s) Bernadene Person, NP    [facility]    Staff Present Samantha Belarus, RD, Dutch Gray, RN, BSN;Nikolaj Geraghty BS, ACSM-CEP, Exercise Physiologist;Kaylee Earlene Plater, MS, ACSM-CEP, Exercise Physiologist;Casey Katrinka Blazing, RT    Virtual Visit No    Medication changes reported     No    Fall or balance concerns reported    Yes    Comments pt uses rollator for balance    Tobacco Cessation No Change    Warm-up and Cool-down Performed as group-led instruction    Resistance Training Performed Yes    VAD Patient? No    PAD/SET Patient? No      Pain Assessment   Currently in Pain? No/denies    Multiple Pain Sites No             Capillary Blood Glucose: No results found for this or any previous visit (from the past 24 hour(s)).    Social History   Tobacco Use  Smoking Status Former   Current packs/day: 0.00   Average packs/day: 1.5 packs/day for 32.0 years (48.0 ttl pk-yrs)   Types: Cigarettes, Cigars   Start date: 67   Quit date: 1998   Years since quitting: 26.7  Smokeless Tobacco Former   Types: Chew    Goals Met:  Exercise tolerated well No report of concerns or symptoms today Strength training completed today  Goals Unmet:  Not Applicable  Comments: Service time is from 1301 to 1444.    Dr. Mechele Collin is Medical Director for Pulmonary Rehab at [facility].

## 2023-02-16 ENCOUNTER — Encounter (HOSPITAL_COMMUNITY): Payer: No Typology Code available for payment source

## 2023-02-16 ENCOUNTER — Telehealth (HOSPITAL_COMMUNITY): Payer: Self-pay

## 2023-02-16 NOTE — Telephone Encounter (Signed)
Pt wife [name] called and stated pt will not be able to attend PR today due to feeling weak, BP high and low O2.  Canceled pt appt and pass call to RN Corrie Dandy to talk with wife.

## 2023-02-16 NOTE — Telephone Encounter (Signed)
Pt's wife called Pulmonary Rehab to inform us he is getting over a respiratory illness and will not be here today. Wife was asking for advice regarding VS and O2. Both WNL. Advised her to reach out to his PCP regarding symptoms and stay hydrated. Advised if symptoms worsen to go to UC/ED. Pt's wife understands without assistance.

## 2023-02-17 ENCOUNTER — Encounter: Payer: Self-pay | Admitting: Internal Medicine

## 2023-02-18 ENCOUNTER — Encounter (HOSPITAL_COMMUNITY)
Admission: RE | Admit: 2023-02-18 | Discharge: 2023-02-18 | Disposition: A | Discharge status: Home / Self Care | Payer: No Typology Code available for payment source | Source: Ambulatory Visit | Attending: Internal Medicine | Admitting: Internal Medicine

## 2023-02-18 DIAGNOSIS — J84112 Idiopathic pulmonary fibrosis: Secondary | ICD-10-CM | POA: Diagnosis not present

## 2023-02-18 NOTE — Progress Notes (Signed)
Pt came to [facility] today and informed staff that he fell at home. His wife stated that he was working out and fell, she had to assist him up. Luke Golden also stated he fell in the elevator on his way out of Pulm Rehab last week. His wife stated she had to run out to catch the bus and tell the bus driver to wait on Luke Golden and she found him in the elevator. Both reported no injuries on either fall.

## 2023-02-18 NOTE — Progress Notes (Signed)
Daily Session Note  Patient Details  Name: Luke Golden MRN: [id number] Date of Birth: July 16, 1945 Referring Provider:   Doristine Devoid Pulmonary Rehab Walk Test from 01/11/2023 in [facility], [facility]  Referring Provider Ramaswamy       Encounter Date: 02/18/2023  Check In:  Session Check In - 02/18/23 1433       Check-In   Supervising physician immediately available to respond to emergencies [facility] MD immediately available    Physician(s) Carlos Levering    [facility]    Staff Present Essie Hart, RN, BSN;Randi Idelle Crouch BS, ACSM-CEP, Exercise Physiologist;Julie Paolini Earlene Plater, MS, ACSM-CEP, Exercise Physiologist;Casey Katrinka Blazing, RT    Virtual Visit No    Medication changes reported     No    Fall or balance concerns reported    Yes    Comments pt fell twice in the last week    Tobacco Cessation No Change    Warm-up and Cool-down Performed as group-led instruction    Resistance Training Performed Yes    VAD Patient? No    PAD/SET Patient? No      Pain Assessment   Currently in Pain? No/denies    Multiple Pain Sites No             Capillary Blood Glucose: No results found for this or any previous visit (from the past 24 hour(s)).    Social History   Tobacco Use  Smoking Status Former   Current packs/day: 0.00   Average packs/day: 1.5 packs/day for 32.0 years (48.0 ttl pk-yrs)   Types: Cigarettes, Cigars   Start date: 19   Quit date: 1998   Years since quitting: 26.7  Smokeless Tobacco Former   Types: Chew    Goals Met:  Proper associated with RPD/PD & O2 Sat Exercise tolerated well No report of concerns or symptoms today Strength training completed today  Goals Unmet:  Not Applicable  Comments: Service time is from 1310 to 1440.    Dr. Mechele Collin is Medical Director for Pulmonary Rehab at [facility].

## 2023-02-22 NOTE — Telephone Encounter (Signed)
It appears the heart rate is low and the blood pressure is on the soft side.  This is a primary care or cardiology issue.  He is on blood pressure medications amlodipine which can contribute to softer blood pressures.  Do talk to your doctor about that  Regarding breathing exercise machines the something called POWERbreathe that you can get on [employer] or incentive spirometry's.  These range from $20 - $100       Current Outpatient Medications:    albuterol (VENTOLIN HFA) 108 (90 Base) MCG/ACT inhaler, Inhale 2 puffs into the lungs every 6 (six) hours as needed for wheezing or shortness of breath., Disp: 8 g, Rfl: 2   amLODipine (NORVASC) 10 MG tablet, Take 10 mg by mouth daily. (Patient not taking: Reported on 01/11/2023), Disp: , Rfl:    aspirin EC 81 MG tablet, Take 81 mg by mouth daily., Disp: , Rfl:    busPIRone (BUSPAR) 10 MG tablet, Take 15-30 mg by mouth See admin instructions. Take 15 mg by mouth in the morning and take 30 mg by mouth at bedtime (Patient not taking: Reported on 12/15/2022), Disp: , Rfl:    divalproex (DEPAKOTE) 250 MG DR tablet, Take 250 mg by mouth., Disp: , Rfl:    famotidine (PEPCID) 20 MG tablet, Take 1 tablet (20 mg total) by mouth at bedtime., Disp: 30 tablet, Rfl: 5   fluticasone furoate-vilanterol (BREO ELLIPTA) 100-25 MCG/ACT AEPB, Inhale 1 puff into the lungs daily., Disp: 60 each, Rfl: 5   memantine (NAMENDA) 10 MG tablet, TAKE ONE TABLET BY MOUTH AT BEDTIME TO SLOW MEMORY LOSS, Disp: , Rfl:    omeprazole (PRILOSEC) 40 MG capsule, Take 1 capsule (40 mg total) by mouth daily. To be taken 30 minutes before breakfast, Disp: 90 capsule, Rfl: 1   Pirfenidone (ESBRIET) 267 MG TABS, Take 3 tablets (801 mg total) by mouth 3 (three) times daily with meals., Disp: 810 tablet, Rfl: 1   rosuvastatin (CRESTOR) 20 MG tablet, Take 10 mg by mouth daily. , Disp: , Rfl:    sildenafil (VIAGRA) 100 MG tablet, TAKE ONE TABLET BY MOUTH AS DIRECTED (TAKE 1 HOUR PRIOR TO SEXUAL ACTIVITY  *DO NOT EXCEED 1 DOSE PER 24 HOUR PERIOD*), Disp: , Rfl:    simethicone (MYLICON) 80 MG chewable tablet, , Disp: , Rfl:

## 2023-02-23 ENCOUNTER — Encounter (HOSPITAL_COMMUNITY): Payer: No Typology Code available for payment source

## 2023-02-25 ENCOUNTER — Encounter (HOSPITAL_COMMUNITY)
Admission: RE | Admit: 2023-02-25 | Discharge: 2023-02-25 | Disposition: A | Discharge status: Home / Self Care | Payer: No Typology Code available for payment source | Source: Ambulatory Visit | Attending: Internal Medicine | Admitting: Internal Medicine

## 2023-02-25 VITALS — Wt 221.1 lb

## 2023-02-25 DIAGNOSIS — J84112 Idiopathic pulmonary fibrosis: Secondary | ICD-10-CM | POA: Diagnosis not present

## 2023-02-25 NOTE — Progress Notes (Signed)
Daily Session Note  Patient Details  Name: Luke Golden MRN: [id number] Date of Birth: 09-17-1945 Referring Provider:   Doristine Devoid Pulmonary Rehab Walk Test from 01/11/2023 in [facility]  Referring Provider Ramaswamy       Encounter Date: 02/25/2023  Check In:  Session Check In - 02/25/23 1322       Check-In   Supervising physician immediately available to respond to emergencies [facility] MD immediately available    Physician(s) Edd Fabian, NP    Location [facility]    Staff Present Essie Hart, RN, BSN;Randi Idelle Crouch BS, ACSM-CEP, Exercise Physiologist;Margurete Guaman Earlene Plater, MS, ACSM-CEP, Exercise Physiologist;Casey Katrinka Blazing, RT    Virtual Visit No    Medication changes reported     No    Fall or balance concerns reported    Yes    Comments pt fell twice in the last week    Tobacco Cessation No Change    Warm-up and Cool-down Performed as group-led instruction    Resistance Training Performed Yes    VAD Patient? No    PAD/SET Patient? No      Pain Assessment   Currently in Pain? No/denies    Multiple Pain Sites No             Capillary Blood Glucose: No results found for this or any previous visit (from the past 24 hour(s)).    Social History   Tobacco Use  Smoking Status Former   Current packs/day: 0.00   Average packs/day: 1.5 packs/day for 32.0 years (48.0 ttl pk-yrs)   Types: Cigarettes, Cigars   Start date: 36   Quit date: 1998   Years since quitting: 26.7  Smokeless Tobacco Former   Types: Chew    Goals Met:  Proper associated with RPD/PD & O2 Sat Exercise tolerated well No report of concerns or symptoms today Strength training completed today  Goals Unmet:  Not Applicable  Comments: Service time is from 1306 to 1438.    Dr. Mechele Collin is Medical Director for Pulmonary Rehab at [facility].

## 2023-03-02 ENCOUNTER — Encounter (HOSPITAL_COMMUNITY): Payer: No Typology Code available for payment source

## 2023-03-03 NOTE — Progress Notes (Signed)
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 CAD, OSA, HTN, pulmonary fibrosis, dementia. Link Snuffer continues to consume a wide variety of foods. He does snack frequently. He continues Esbriet. He is down 2.4# since starting with our program; will continue to monitor weight throughout pulmonary rehab. Link Snuffer will benefit from participation in pulmonary rehab for for nutrition, exercise, and lifestyle modification.               Nutrition Goals Discharge (Final Nutrition Goals Re-Evaluation):  Nutrition Goals Re-Evaluation - 02/26/23 1443       Goals   Current Weight 221 lb 1.9 oz (100.3 kg)    Comment A1c WNL, LDL 117    Expected Outcome Eddie has medical history of CAD, OSA, HTN, pulmonary fibrosis,  dementia. Link Snuffer continues to consume a wide variety of foods. He does snack frequently. He continues Esbriet. He is down 2.4# since starting with our program; will continue to monitor weight throughout pulmonary rehab. Link Snuffer will benefit from participation in pulmonary rehab for for nutrition, exercise, and lifestyle modification.             Psychosocial: Target Goals:  Acknowledge presence or absence of significant depression and/or stress, maximize coping skills, provide positive support system. Participant is able to verbalize types and ability to use techniques and skills needed for reducing stress and depression.  Initial Review & Psychosocial Screening:  Initial Psych Review & Screening - 01/11/23 1318       Initial Review   Current issues with Current Psychotropic Meds;Current Depression      Family Dynamics   Good Support System? Yes      Barriers   Psychosocial barriers to participate in program The patient should benefit from training in stress management and relaxation.      Screening Interventions   Interventions Encouraged to exercise;To provide support and resources with identified psychosocial needs    Expected Outcomes Short Term goal: Identification and review with participant of any Quality of Life or Depression concerns found by scoring the questionnaire.;Long Term goal: The participant improves quality of Life and PHQ9 Scores as seen by post scores and/or verbalization of changes             Quality of Life Scores:  Scores of 19 and below usually indicate a poorer quality of life in these areas.  A difference of  2-3 points is a clinically meaningful difference.  A difference of 2-3 points in the total score of the Quality of Life Index has been associated with significant improvement in overall quality of life, self-image, physical symptoms, and general health in studies assessing change in quality of life.  PHQ-9: Review Flowsheet       01/11/2023  04/02/2022 01/29/2022  Depression screen PHQ 2/9  Decreased Interest 3 0 0  Down, Depressed, Hopeless 2 1 0  PHQ - 2 Score 5 1 0  Altered sleeping 3 - -  Tired, decreased energy 3 - -  Change in appetite 3 - -  Feeling bad or failure about yourself  0 - -  Trouble concentrating 1 - -  Moving slowly or fidgety/restless 0 - -  Suicidal thoughts 0 - -  PHQ-9 Score 15 - -  Difficult doing work/chores Somewhat difficult - -    Details           Interpretation of Total Score  Total Score Depression Severity:  1-4 = Minimal depression, 5-9 = Mild depression, 10-14 = Moderate depression, 15-19 = Moderately severe depression, 20-27 = Severe depression   Psychosocial Evaluation and Intervention:  Psychosocial Evaluation - 01/11/23 1320       Psychosocial Evaluation & Interventions   Interventions Encouraged to exercise with the program and follow exercise prescription;Relaxation education    Comments Emitt scored high on his PHQ9 screening and  states he feels like he does have some depression at this time. 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 He is currently working with a therapist and using psychotropic medications. He also relies on his faith to help him through hard times.    Expected Outcomes For Dru to participate in PR free of any psychosocial barriers or concerns    Continue Psychosocial Services  Follow up required by staff             Psychosocial Re-Evaluation:  Psychosocial Re-Evaluation     Row Name 01/27/23 1351 02/26/23 0958           Psychosocial Re-Evaluation   Current issues with Current Psychotropic Meds;Current Depression;History of Depression Current Psychotropic Meds;Current Depression;History of Depression      Comments No changes since Eddie's orientation. Eddie  has attended 1 class so far. Link Snuffer denies any needs at this time. Link Snuffer is doing well in PR. He states he feels his depression is stable at this time. No new psychosocial  barriers or concerns at this time.      Expected Outcomes For Eddie to reduce symptoms of depression & attend PR without any psychosocial barriers or concerns For Eddie to continue to participate in PR free of any psychosocial barriers or concerns.      Interventions Encouraged to attend Pulmonary Rehabilitation for the exercise Encouraged to attend Pulmonary Rehabilitation for the exercise      Continue Psychosocial Services  Follow up required by staff Follow up required by staff               Psychosocial Discharge (Final Psychosocial Re-Evaluation):  Psychosocial Re-Evaluation - 02/26/23 0958       Psychosocial Re-Evaluation   Current issues with Current Psychotropic Meds;Current Depression;History of Depression    Comments Link Snuffer is doing well in PR. He states he feels his depression is stable at this time. No new psychosocial barriers or concerns at this time.    Expected Outcomes For Eddie to continue to participate in PR free of any psychosocial barriers or concerns.    Interventions Encouraged to attend Pulmonary Rehabilitation for the exercise    Continue Psychosocial Services  Follow up required by staff             Education: Education Goals: Education classes will be provided on a weekly basis, covering required topics. Participant will state understanding/return demonstration of topics presented.  Learning Barriers/Preferences:  Learning Barriers/Preferences - 01/11/23 1321       Learning Barriers/Preferences   Learning Barriers Hearing    Learning Preferences Group Instruction;Individual Instruction             Education Topics: Know Your Numbers Group instruction that is supported by a PowerPoint presentation. Instructor discusses importance of knowing and understanding resting, exercise, and post-exercise oxygen saturation, heart rate, and blood pressure. Oxygen saturation, heart rate, blood pressure, rating of perceived exertion, and dyspnea are  reviewed along with a normal range for these values.    Exercise for the Pulmonary Patient Group instruction that is supported by a PowerPoint presentation. Instructor discusses benefits of exercise, core components of exercise, frequency, duration, and intensity of an exercise routine, importance of utilizing pulse oximetry during exercise, safety while exercising, and options of places to exercise outside of rehab.  Flowsheet Row PULMONARY REHAB OTHER RESPIRATORY from 02/25/2023 in [facility] for Heart, Vascular, & Lung Health  Date 02/25/23  Educator EP  Instruction Review Code 1- Verbalizes Understanding       MET Level  Group instruction provided by PowerPoint, verbal discussion, and written material to support subject matter. Instructor reviews what METs are and how to increase METs.    Pulmonary Medications Verbally interactive group education provided by instructor with focus on inhaled medications and proper administration. Flowsheet Row PULMONARY REHAB OTHER RESPIRATORY from 02/18/2023 in [facility] for Heart, Vascular, & Lung Health  Date 02/18/23  Educator RT  Instruction Review Code 1- Verbalizes Understanding       Anatomy and Physiology of the Respiratory System Group instruction provided by PowerPoint, verbal discussion, and written material to support subject matter. Instructor reviews respiratory cycle and anatomical components of the respiratory system and their functions. Instructor also reviews differences in obstructive and restrictive respiratory diseases with examples of each.  Flowsheet Row  PULMONARY REHAB OTHER RESPIRATORY from 02/11/2023 in [facility] for Heart, Vascular, & Lung Health  Date 02/11/23  Educator Baird Lyons, RT  Instruction Review Code 1- Verbalizes Understanding       Oxygen Safety Group instruction provided by PowerPoint, verbal discussion, and written material to  support subject matter. There is an overview of "What is Oxygen" and "Why do we need it".  Instructor also reviews how to create a safe environment for oxygen use, the importance of using oxygen as prescribed, and the risks of noncompliance. There is a brief discussion on traveling with oxygen and resources the patient may utilize.   Oxygen Use Group instruction provided by PowerPoint, verbal discussion, and written material to discuss how supplemental oxygen is prescribed and different types of oxygen supply systems. Resources for more information are provided.    Breathing Techniques Group instruction that is supported by demonstration and informational handouts. Instructor discusses the benefits of pursed lip and diaphragmatic breathing and detailed demonstration on how to perform both.     Risk Factor Reduction Group instruction that is supported by a PowerPoint presentation. Instructor discusses the definition of a risk factor, different risk factors for pulmonary disease, and how the heart and lungs work together.   Pulmonary Diseases Group instruction provided by PowerPoint, verbal discussion, and written material to support subject matter. Instructor gives an overview of the different type of pulmonary diseases. There is also a discussion on risk factors and symptoms as well as ways to manage the diseases.   Stress and Energy Conservation Group instruction provided by PowerPoint, verbal discussion, and written material to support subject matter. Instructor gives an overview of stress and the impact it can have on the body. Instructor also reviews ways to reduce stress. There is also a discussion on energy conservation and ways to conserve energy throughout the day.   Warning Signs and Symptoms Group instruction provided by PowerPoint, verbal discussion, and written material to support subject matter. Instructor reviews warning signs and symptoms of stroke, heart attack, cold and flu.  Instructor also reviews ways to prevent the spread of infection.   Other Education Group or individual verbal, written, or video instructions that support the educational goals of the pulmonary rehab program. Flowsheet Row PULMONARY REHAB OTHER RESPIRATORY from 02/04/2023 in [facility] for Heart, Vascular, & Lung Health  Date 02/04/23  Educator RT  Instruction Review Code 1- Verbalizes Understanding        Knowledge Questionnaire Score:  Knowledge Questionnaire Score - 01/11/23 1400       Knowledge Questionnaire Score   Pre Score 11/18             Core Components/Risk Factors/Patient Goals at Admission:  Personal Goals and Risk Factors at Admission - 01/11/23 1321       Core Components/Risk Factors/Patient Goals on Admission    Weight Management Weight Loss;Yes    Intervention Weight Management: Develop a combined nutrition and exercise program designed to reach desired caloric intake, while maintaining appropriate intake of nutrient and fiber, sodium and fats, and appropriate energy expenditure required for the weight goal.;Weight Management: Provide education and appropriate resources to help participant work on and attain dietary goals.;Obesity: Provide education and appropriate resources to help participant work on and attain dietary goals.    Expected Outcomes Short Term: Continue to assess and modify interventions until short term weight is achieved;Long Term: Adherence to nutrition and physical activity/exercise program aimed toward attainment of established weight goal;Weight Loss:  Understanding of general recommendations for a balanced deficit meal plan, which promotes 1-2 lb weight loss per week and includes a negative energy balance of [phone] kcal/d;Understanding recommendations for meals to include 15-35% energy as protein, 25-35% energy from fat, 35-60% energy from carbohydrates, less than 200mg  of dietary cholesterol, 20-35 gm of total fiber  daily    Improve shortness of breath with ADL's Yes    Intervention Provide education, individualized exercise plan and daily activity instruction to help decrease symptoms of SOB with activities of daily living.    Expected Outcomes Short Term: Improve cardiorespiratory fitness to achieve a reduction of symptoms when performing ADLs;Long Term: Be able to perform more ADLs without symptoms or delay the onset of symptoms    Increase knowledge of respiratory medications and ability to use respiratory devices properly  Yes    Intervention Provide education and demonstration as needed of appropriate use of medications, inhalers, and oxygen therapy.    Expected Outcomes Short Term: Achieves understanding of medications use. Understands that oxygen is a medication prescribed by physician. Demonstrates appropriate use of inhaler and oxygen therapy.;Long Term: Maintain appropriate use of medications, inhalers, and oxygen therapy.    Hypertension Yes    Intervention Provide education on lifestyle modifcations including regular physical activity/exercise, weight management, moderate sodium restriction and increased consumption of fresh fruit, vegetables, and low fat dairy, alcohol moderation, and smoking cessation.;Monitor prescription use compliance.    Expected Outcomes Short Term: Continued assessment and intervention until BP is < 140/44mm HG in hypertensive participants. < 130/61mm HG in hypertensive participants with diabetes, heart failure or chronic kidney disease.;Long Term: Maintenance of blood pressure at goal levels.             Core Components/Risk Factors/Patient Goals Review:   Goals and Risk Factor Review     Row Name 01/27/23 1423 02/26/23 1004           Core Components/Risk Factors/Patient Goals Review   Personal Goals Review Weight Management/Obesity;Improve shortness of breath with ADL's;Develop more efficient breathing techniques such as purse lipped breathing and diaphragmatic  breathing and practicing self-pacing with activity.;Hypertension;Increase knowledge of respiratory medications and ability to use respiratory devices properly. Weight Management/Obesity;Improve shortness of breath with ADL's;Develop more efficient breathing techniques such as purse lipped breathing and diaphragmatic breathing and practicing self-pacing with activity.      Review Unable to assess goals. Link Snuffer has completed 1 class so far. Goal progressing for weight loss. Link Snuffer is working with our dietician for weight loss goals. Goal progressing on improving her shortness of breath with ADLs. Goal progressing on developing more efficient breathing techniques such as purse lipped breathing and diaphragmatic breathing; and practicing self-pacing with activity. Link Snuffer has to be reminded to use purse lip breathing when he gets SOB. Goal met for increasing knowledge of respiratory medications and ability to use respiratory devices properly. He has demonstrated proper use of MDI to the respiratory therapist. Goal progressing on hypertension. Eddies B/P's have been stable in class. We will continue to monitor them throughout the program.      Expected Outcomes For Eddie to lose weight, improve his shortness of breath with ADLs, develop more efficient breathing techniques, increase his knowledge of respiratory medications, and have his hypertension well controlled For Eddie to lose weight, improve his shortness of breath with ADLs, develop more efficient breathing techniques, and have his hypertension well controlled               Core Components/Risk Factors/Patient Goals at Discharge (  Final Review):   Goals and Risk Factor Review - 02/26/23 1004       Core Components/Risk Factors/Patient Goals Review   Personal Goals Review Weight Management/Obesity;Improve shortness of breath with ADL's;Develop more efficient breathing techniques such as purse lipped breathing and diaphragmatic breathing and practicing  self-pacing with activity.    Review Goal progressing for weight loss. Link Snuffer is working with our dietician for weight loss goals. Goal progressing on improving her shortness of breath with ADLs. Goal progressing on developing more efficient breathing techniques such as purse lipped breathing and diaphragmatic breathing; and practicing self-pacing with activity. Link Snuffer has to be reminded to use purse lip breathing when he gets SOB. Goal met for increasing knowledge of respiratory medications and ability to use respiratory devices properly. He has demonstrated proper use of MDI to the respiratory therapist. Goal progressing on hypertension. Eddies B/P's have been stable in class. We will continue to monitor them throughout the program.    Expected Outcomes For Eddie to lose weight, improve his shortness of breath with ADLs, develop more efficient breathing techniques, and have his hypertension well controlled             ITP Comments:Pt is making expected progress toward Pulmonary Rehab goals after completing 7 sessions. Recommend continued exercise, life style modification, education, and utilization of breathing techniques to increase stamina and strength, while also decreasing shortness of breath with exertion.  Dr. Mechele Collin is Medical Director for Pulmonary Rehab at [facility].     Comments: Dr. Mechele Collin is Medical Director for Pulmonary Rehab at [facility].

## 2023-03-04 ENCOUNTER — Encounter (HOSPITAL_COMMUNITY)
Admission: RE | Admit: 2023-03-04 | Discharge: 2023-03-04 | Disposition: A | Discharge status: Home / Self Care | Payer: No Typology Code available for payment source | Source: Ambulatory Visit | Attending: Internal Medicine | Admitting: Internal Medicine

## 2023-03-04 DIAGNOSIS — J84112 Idiopathic pulmonary fibrosis: Secondary | ICD-10-CM | POA: Diagnosis present

## 2023-03-04 NOTE — Progress Notes (Signed)
Daily Session Note  Patient Details  Name: Luke Golden MRN: [id number] Date of Birth: Sep 20, 1945 Referring Provider:   Doristine Devoid Pulmonary Rehab Walk Test from 01/11/2023 in [facility] for Heart, Vascular, & Lung Health  Referring Provider Ramaswamy       Encounter Date: 03/04/2023  Check In:  Session Check In - 03/04/23 1420       Check-In   Supervising physician immediately available to respond to emergencies [facility] MD immediately available    Physician(s) Joni Reining, NP    Location [facility]    Staff Present Essie Hart, RN, BSN;Randi Idelle Crouch BS, ACSM-CEP, Exercise Physiologist;Muntaha Vermette Earlene Plater, MS, ACSM-CEP, Exercise Physiologist;Casey Katrinka Blazing, RT    Virtual Visit No    Medication changes reported     No    Fall or balance concerns reported    Yes    Tobacco Cessation No Change    Warm-up and Cool-down Performed as group-led instruction    Resistance Training Performed Yes    VAD Patient? No    PAD/SET Patient? No      Pain Assessment   Currently in Pain? No/denies    Multiple Pain Sites No             Capillary Blood Glucose: No results found for this or any previous visit (from the past 24 hour(s)).    Social History   Tobacco Use  Smoking Status Former   Current packs/day: 0.00   Average packs/day: 1.5 packs/day for 32.0 years (48.0 ttl pk-yrs)   Types: Cigarettes, Cigars   Start date: 75   Quit date: 1998   Years since quitting: 26.7  Smokeless Tobacco Former   Types: Chew    Goals Met:  Proper associated with RPD/PD & O2 Sat Exercise tolerated well No report of concerns or symptoms today Strength training completed today  Goals Unmet:  Not Applicable  Comments: Service time is from 1311 to 1435.    Dr. Mechele Collin is Medical Director for Pulmonary Rehab at [facility].

## 2023-03-09 ENCOUNTER — Encounter (HOSPITAL_COMMUNITY)
Admission: RE | Admit: 2023-03-09 | Discharge: 2023-03-09 | Disposition: A | Discharge status: Home / Self Care | Payer: No Typology Code available for payment source | Source: Ambulatory Visit | Attending: Internal Medicine | Admitting: Internal Medicine

## 2023-03-09 VITALS — Wt 221.3 lb

## 2023-03-09 DIAGNOSIS — J84112 Idiopathic pulmonary fibrosis: Secondary | ICD-10-CM | POA: Diagnosis not present

## 2023-03-09 NOTE — Progress Notes (Signed)
Daily Session Note  Patient Details  Name: Luke Golden MRN: [id number] Date of Birth: [birth year] Referring Provider:   Doristine Devoid Pulmonary Rehab Walk Test from 01/11/2023 in [facility] for Heart, Vascular, & Lung Health  Referring Provider Ramaswamy       Encounter Date: 03/09/2023  Check In:  Session Check In - 03/09/23 1434       Check-In   Supervising physician immediately available to respond to emergencies [facility] MD immediately available    Physician(s) Joni Reining, NP    Location [facility]    Staff Present Elissa Lovett BS, ACSM-CEP, Exercise Physiologist;Nyilah Kight Earlene Plater, MS, ACSM-CEP, Exercise Physiologist;Casey Thedore Mins, RN, BSN    Virtual Visit No    Medication changes reported     No    Fall or balance concerns reported    Yes    Comments Pt uses walker sometimes to get around. Pt will leave walker to the side.    Tobacco Cessation No Change    Warm-up and Cool-down Performed as group-led instruction    Resistance Training Performed Yes    VAD Patient? No    PAD/SET Patient? No      Pain Assessment   Currently in Pain? No/denies    Multiple Pain Sites No             Capillary Blood Glucose: No results found for this or any previous visit (from the past 24 hour(s)).    Social History   Tobacco Use  Smoking Status Former   Current packs/day: 0.00   Average packs/day: 1.5 packs/day for 32.0 years (48.0 ttl pk-yrs)   Types: Cigarettes, Cigars   Start date: 23   Quit date: 1998   Years since quitting: 26.7  Smokeless Tobacco Former   Types: Chew    Goals Met:  Proper associated with RPD/PD & O2 Sat Exercise tolerated well No report of concerns or symptoms today Strength training completed today  Goals Unmet:  Not Applicable  Comments: Service time is from 1306 to 1432.    Dr. Mechele Collin is Medical Director for Pulmonary Rehab at [facility].

## 2023-03-11 ENCOUNTER — Telehealth (HOSPITAL_COMMUNITY): Payer: Self-pay

## 2023-03-11 ENCOUNTER — Telehealth (INDEPENDENT_AMBULATORY_CARE_PROVIDER_SITE_OTHER): Payer: No Typology Code available for payment source | Admitting: Internal Medicine

## 2023-03-11 ENCOUNTER — Encounter (HOSPITAL_COMMUNITY): Payer: No Typology Code available for payment source

## 2023-03-11 ENCOUNTER — Encounter: Payer: Self-pay | Admitting: Internal Medicine

## 2023-03-11 DIAGNOSIS — J84112 Idiopathic pulmonary fibrosis: Secondary | ICD-10-CM

## 2023-03-11 DIAGNOSIS — R0609 Other forms of dyspnea: Secondary | ICD-10-CM

## 2023-03-11 DIAGNOSIS — Z77098 Contact with and (suspected) exposure to other hazardous, chiefly nonmedicinal, chemicals: Secondary | ICD-10-CM | POA: Diagnosis not present

## 2023-03-11 DIAGNOSIS — R5383 Other fatigue: Secondary | ICD-10-CM | POA: Diagnosis not present

## 2023-03-11 NOTE — Telephone Encounter (Signed)
Per pt wife [name] pt will not be able to attend PR today due to not feeling well.

## 2023-03-11 NOTE — Progress Notes (Signed)
OV 11/18/2021  Subjective:  Patient ID: Luke Golden, male , DOB: 01/19/1946 , age 77 y.o. , MRN: [id number] , ADDRESS: [address] [place] [address] PCP [facility] Patient Care Team: [facility] as PCP - General  This Provider for this visit: Treatment Team:  Attending Provider: Kalman Shan, MD PCP Dr Jess Barters at [facility]   11/18/2021 -   Chief Complaint  Patient presents with   Consult    Pt recently had a PFT performed and is here today to go over the results. Pt does have complaints of an occasional cough and SOB that is worse with exertion.     HPI Luke Golden 77 y.o. -referred by the [facility].  History is obtained from talking to him and his wife.  They live in [place].  Wife works as a Psychologist, occupational at [employer].  He is asked weight no more veteran.  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  Denies any agent orange exposure.  Former smoker.  According to the wife for the last few years he has had exertional fatigue and also shortness of breath.  But insidiously and gradually is gotten worse.  Sometime towards the end of 2022 at the beginning of 2023 he did have a viral syndrome [facility].  Then in the visit with the [facility] in April 2023 it appears that he started complaining of exertional fatigue and shortness of breath.  Was fairly significant.  They did a chest x-ray on him on September 12, 2021.  Only have the report.  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  He has old chest x-ray here that I personally visualized shows some chronic atelectatic changes.  They did a pulmonary function test and it shows normal FEV1 FVC TLC but isolated reduction in diffusion capacity to 64.2% overall wife says that he is got a lot of fatigue dyspnea lower stamina.  Things are getting worse.  His past medical history is significant for short-term  memory loss, left femoral DVT in 2000.  Denies any substance abuse or tobacco abuse alcohol use in the last 20 years.  He has obstructive sleep apnea moderate but he has quit using his CPAP in the last week.  This is because of facial fit issues.  He also has back pain PTSD coronary artery disease hyperlipidemia acid reflux hypertension.   According to him he had a CT scan of the chest but we could not find evidence for this.  According to my CMA has had a echocardiogram at the [place] but I could not find evidence for this.  He had a good walking desaturation test here.     CT Chest data - Nov 2015  CLINICAL DATA:  Chest pain.   EXAM: PORTABLE CHEST - 1 VIEW   COMPARISON:  July 05, 2013.   FINDINGS: Stable cardiomediastinal silhouette. No pneumothorax or pleural effusion is noted. Minimal bibasilar subsegmental atelectasis is noted. Old right rib fractures are noted.   IMPRESSION: Minimal bibasilar subsegmental atelectasis.     Electronically Signed   By: Roque Lias M.D.   On: 04/23/2014 12:29  No results found.  Nuclear Medicine Stress tst 2017  IMPRESSION: 1. No scintigraphic evidence of prior infarction or pharmacologically induced ischemia.   2. Normal left ventricular wall motion.   3. Left ventricular ejection fraction 59%  4. Non invasive risk stratification*: Low   *2012 Appropriate Use Criteria for Coronary Revascularization Focused Update: J Am Coll Cardiol. 2012;59(9):857-881. http://content.dementiazones.com.aspx?articleid=[email or web address]     Electronically Signed   By: Simonne Come M.D.   On: 01/28/2016 13:07       12/22/2021: Today-follow-up Patient presents today for follow-up after undergoing high-resolution CT scan, ILD panel and pulmonary function testing.  Pulmonary function testing was completed today and showed normal spirometry and lung volumes.  He did have a mild reduction in DLCO at 73%.  He was unable to complete postbronchodilator  spirometry.  ILD panel was negative aside from low positive ANA and QuantiFERON gold.  No active evidence of TB on recent imaging.  He has never been told in the past that he has TB or has been exposed to it.  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  Today, he reports feeling unchanged when compared to when he was here last.  Breathing is overall stable.  He does get winded, primarily with climbing and long distances.  He also has ongoing issues with fatigue; however, he had stopped using his CPAP, which could be a large contributing factor.  He has since restarted and feels like he has gotten more restful sleep and wakes up feeling better in the morning.  He denies any recent fevers, night sweats, hemoptysis, weight loss, anorexia, lower extremity edema, orthopnea. Denies joint pain, dry mouth or eyes. He has yet to complete ONO and echocardiogram.  Never tried any inhalers for his shortness of breath.   IMPRESSION: 1. Findings are indicative of interstitial lung disease, clearly progressive compared to remote prior study from 2012, with a spectrum of findings considered probable usual interstitial pneumonia (UIP) per current ATS guidelines. Repeat high-resolution chest CT is suggested in 12 months to assess for temporal changes in the appearance of the lung parenchyma. 2. Aortic atherosclerosis, in addition to left main and three-vessel coronary artery disease. Assessment for potential risk factor modification, dietary therapy or pharmacologic therapy may be warranted, if clinically indicated. 3. There are calcifications of the aortic valve and mitral annulus. Echocardiographic correlation for evaluation of potential valvular dysfunction may be warranted if clinically indicated. 4. Cholelithiasis.   Aortic Atherosclerosis (ICD10-I70.0).     Electronically Signed   By: Trudie Reed M.D.   On: 12/09/2021  09:38  OV 01/27/2022  Subjective:  Patient ID: Luke Golden, male , DOB: [birth year] , age 77 y.o. , MRN: [id number] , ADDRESS: [address] [place] [address] PCP [facility] Patient Care Team: [facility] as PCP - General  This Provider for this visit: Treatment Team:  Attending Provider: Kalman Shan, MD    01/27/2022 -   Chief Complaint  Patient presents with   Follow-up    Pt states he has been doing okay since last visit. Pt is still SOB but states it is about the same since last visit.    HPI Luke Golden 77 y.o. -returns for follow-up.  I last saw him a few months ago and then he saw a Publishing rights manager.  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  INH and rifampin have been commenced.  After that he called our office or at least the wife called and reported that he was having shakes and  trembles and frequent falls.  Also complaining of dyspnea on exertion.  Neither of them are able to tell me that if the symptoms are worse than before or of the onset of it started after the starting of INH and rifampin.  Definitely the call happened for the shakes and falls and the tremors after he got started on INH and rifampin.  But they are unable to explicitly tell me the duration.  In addition he was given ILD question in July 2023 by the nurse practitioner.  He was supposed to bring it with him today but I do not have it.  The wife believes that she fill it up and brought it and gave it to [place].  He does admit to previous street drug use.  He also admits to previous heavy smoking.  He also admits to agent orange exposure for a year while he was in [place].  Of note the wife works at [employer].  She is now needing to care for him.  [facility] her to care for him.  But she needs a letter of support.  I did indicate to her about the FMLA option.  But she  does want a caregiver support letter.      OV 04/21/2022  Subjective:  Patient ID: Luke Golden, male , DOB: 1946/02/15 , age 84 y.o. , MRN: [id number] , ADDRESS: [address] [place] [address] PCP [facility] Patient Care Team: [facility] as PCP - General  This Provider for this visit: Treatment Team:  Attending Provider: Kalman Shan, MD    04/21/2022 -   Chief Complaint  Patient presents with   Follow-up    PFT performed today.  Pt finished taking the TB medication about 1 month ago. Still coughing up phlegm in the mornings that is clear in color. States that he does have complaints of SOB.  HPI Luke Golden 77 y.o. -returns for follow-up.  He is here to discuss his pulmonary function test and symptoms symptoms are stable.  Pulmonary function test is stable.  He has completed his INH treatment.  Gave him and his wife diagnosis of IPF based on above criteria.  This was reminded and read discussion because the short-term memory loss.  In the interim he did get COVID-19 in September 2023 and was on Paxlovid.  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  There are no new issues.   We discussed several aspects of his care plan.  We discussed clinical trials but given his short-term memory loss and inability to consent and his lack of interest in "being a [place] pig" they are not interested in that at this point in time.  They might revisit this in the future.  Discussed pirfenidone and nintedanib.  He does not want to do nintedanib because of the diarrhea side effect.  He feels he is overweight and would rather go with pirfenidone which would give him some nausea and anorexia and possible weight loss. Discussed the fact is 3 pills 3 times daily and just apply sunscreen and he is to space this out 5 to 6 hours apart.  His wife made notes about all this.  Liver function test monitoring is required.  They decided to go with  pirfenidone.   06/02/2022: Today - follow up - NP Patient presents today for follow up with his wife. He started Esbriet around 2 weeks ago. He is getting ready to start his third week and maintenance  dose of 3 pills, three times a day. He has tolerated it well so far. His wife does feel like he has been coughing more at night since he started it but otherwise, his symptoms are stable. Cough is only productive in AM with clear sputum. He does feel like his reflux is a little worse than it has been; otherwise, no GI issues. No fevers, chills, night sweats, hemoptysis. He knows he needs to be more active, which he is going to work on.    OV 08/18/2022  Subjective:  Patient ID: Luke Golden, male , DOB: [birth year] , age 49 y.o. , MRN: [id number] , ADDRESS: [address] [place] [address] PCP [facility] Patient Care Team: [facility] as PCP - General  This Provider for this visit: Treatment Team:  Attending Provider: Kalman Shan, MD    08/18/2022 -   Chief Complaint  Patient presents with   Follow-up    Pft review, cough all the times started in ( September ) does use robitussin. Post covid.      HPI Luke Golden 77 y.o. -returns for follow-up.  He presents with his wife Luke Golden.  His wife Luke Golden is on a wheelchair because she fractured her feet in February 2024.  Normally she is the caregiver driving him but this time he drove her.  He tells me that over a month ago he stopped taking pirfenidone because he was tired and dizzy and constipated and occasional nausea.  He did not inform his wife about it.  Instead he was giving her the empty pillbox.  She only knew about it today when he revealed this information to me.  But from a shortness of breath standpoint he feels stable.  Shortness of breath score is stable.  However he does have significant amount of cough.  Robitussin controls it.  Wife does not want to pay for over-the-counter Robitussin but is  asking for a prescription so they can get the prescription from the [place] at low cost/no cost.  However review of his medications reveals that he is taking lisinopril.  He is willing to stop this.  He did agree that if stopping this and switching over to another medication of my recommendation does not help then he is willing to try other medications or further testing.   He was supposed to have interim pulmonary function test.  Apparently was done at the [facility] but we have not obtained it.  CMA reports it is very difficult to get pulmonary function test from the [place].  OV 10/06/2022  Subjective:  Patient ID: Luke Golden, male , DOB: 1946/02/10 , age 66 y.o. , MRN: [id number] , ADDRESS: [address] [place] [address] PCP [facility] Patient Care Team: [facility] as PCP - General  This Provider for this visit: Treatment Team:  Attending Provider: Kalman Shan, MD     10/06/2022 -   Chief Complaint  Patient presents with   Follow-up    F/up on IPF     HPI Luke Golden 77 y.o. -returns for follow-up.  Presents with his wife Luke Golden.  After the last visit they complained about pirfenidone side effect so we switched him to nintedanib but they went home and spoke to the pharmacist and apparently the pharmacist said that he was on borrowed time and therefore he restarted his pirfenidone.  Today says he is tolerating it well.  Although the wife and he said that  he is sleeping a lot and he gets up late.  It is unclear if this got worse after the pirfenidone start.  It seems like it has been going on for quite a while.  Last visit I also told him to stop his lisinopril but his cough continues.  It is quite severe.  He takes Robitussin which controls it.  It is unclear if the cough is improved after stopping lisinopril the wife does say that she does not hear him cough is much but it could be because of Robitussin.  Shortness of breath appears to be the  same but the main complaint is that there is a lot of fatigue and sleepiness.  He is not using his CPAP.  I did a sit/stand test on him 10 times.  He did desaturate to 87/86%.  And he recovered with rest.  He did get a little dyspneic.  He does not have portable oxygen he does not have night oxygen.    OV 12/15/2022  Subjective:  Patient ID: Luke Golden, male , DOB: [birth year] , age 16 y.o. , MRN: [id number] , ADDRESS: [address] [place] [address] PCP [facility] Patient Care Team: [facility] as PCP - General  This Provider for this visit: Treatment Team:  Attending Provider: Kalman Shan, MD    12/15/2022 -   Chief Complaint  Patient presents with   Follow-up    Pft, ct f/u,      HPI Luke Golden 77 y.o. -returns for follow-up.  Presents with his wife.  Wife is independent historian in fact most of the historian.   IPF: Dyspnea stable.  Pulmonary function test stable.  Did CT scan of the chest personally visualized and agree.  IPF itself is stable.  He has been on pirfenidone for over 6 months at this point.  He and his wife are pleased to hear this  High risk prescription with pirfenidone: Requires intensive therapeutic monitoring because of significant GI side effects and drug-induced liver injury.  He will have liver test today.  Of note wife is concerned that office delayed in getting the approval from the [facility].  The last prescription for the pirfenidone he has is today.  After this he is getting a overnight shipment.  I have sent a message to the pharmacy team to see if he can do any improvement in process   Cough: Continues to be problematic.  Cough is severe despite stopping lisinopril last visit.  Allergy panel was negative.  But his blood eosinophils are high although his nitric oxide test today is normal.  There is some mild emphysema.  We talked about empirically covering this with inhaled corticosteroid/long-acting  beta agonist.  He is open to this idea.  I communicated this with the pharmacy team because they have to get approval from the [place].  If this does not work we could try gabapentin or Stiolto.  Last resort would be chronic prednisone.  Biologic against eosinophils would be an option but would be very tough to get approval   Dyspnea: With exercise hypoxemia this is stable.  At last visit he had excess hypoxemia with sit/stand test but they could not reproduce it at the [place] and the [place] refused to give him oxygen.  Will get him to pulmonary rehabilitation.  Even this the approval from the [place] has been delayed but if he can do this then we will test his exercise hypoxemia test at the rehab  and we can reassess his oxygen need at that time  Fatigue: This continues to be problematic I do not think pirfenidone is made this worse.  His fatigue scores are significantly high.  Will see what happens with pulmonary rehabilitation.    CT Chest data from date: 12/14/22  - personally visualized and independently interpreted : yes - my findings are: same as below   LAB RESULTS last 96 hours CT Chest High Resolution  Result Date: 12/14/2022 CLINICAL DATA:  Idiopathic pulmonary fibrosis. EXAM: CT CHEST WITHOUT CONTRAST TECHNIQUE: Multidetector CT imaging of the chest was performed following the standard protocol without intravenous contrast. High resolution imaging of the lungs, as well as inspiratory and expiratory imaging, was performed. RADIATION DOSE REDUCTION: This exam was performed according to the departmental dose-optimization program which includes automated exposure control, adjustment of the mA and/or kV according to patient size and/or use of iterative reconstruction technique. COMPARISON:  12/08/2021 FINDINGS: Cardiovascular: Normal heart size. No pericardial effusions. Normal caliber thoracic aorta. Calcification of the aorta and coronary arteries. Mediastinum/Nodes: Thyroid gland is unremarkable. Esophagus is  decompressed. No significant lymphadenopathy. Calcified right hilar and mediastinal lymph nodes are present consistent with postinflammatory change. Lungs/Pleura: Subpleural fibrosis and emphysematous change most prominent in the peripheral and basilar regions consistent with the history of usual interstitial pneumonitis. No significant airspace or ground-glass changes to suggest active alveolitis. Appearances are similar to prior study without significant radiographic progression. Calcified granulomas in the lungs. No significant pulmonary nodule or consolidation. Mild cylindrical bronchiectasis with bronchial wall thickening. Upper Abdomen: No acute abnormality. Musculoskeletal: Degenerative changes in the spine. No destructive bone lesions. Old rib fractures. IMPRESSION: 1. 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 No significant progression since previous study. 2. Mild bronchiectasis and bronchial wall thickening. 3. Aortic atherosclerosis. Electronically Signed   By: Burman Nieves M.D.   On: 12/14/2022 15:10      OV 03/11/2023  Subjective:  Patient ID: Luke Golden, male , DOB: 1945-06-16 , age 77 y.o. , MRN: [id number] , ADDRESS: [address] [place] [address] PCP [facility] Patient Care Team: [facility] as PCP - General  This Provider for this visit: Treatment Team:  Attending Provider: Kalman Shan, MD   Follow-up  -  idiopathic pulmonary fibrosis [IPF-diagnosis given December 22, 2021 by nurse practitioner  -Male gender, age greater than 77, remote heavy smoking, agent orange exposure, probable UIP by CT chest with progression and negative serology.   - Esbrrit start nov/dec 2023 -> stopped feb 2024: estarted pirfenidone 08/18/2022   -QuantiFERON gold positive.  Seen Dr. Orvan Falconer 01/01/2022 and started on INH and rifampin x37-month  -Short-term memory loss and wife with  significant caregiver burden  -Sleep apnea does not use CPAP  -Severe baseline chronic cough.   - Stop lisinopril March 2024. - blood eos high 400 cells/mm 10/06/22; negative IgE and RAST allergy panel - normal feno 12/15/2022  - fibrois and some emphyseya on CT   -High risk prescription pirfenidone with need for intensive therapeutic monitoring  - Esbriet/Pirfenidone requires intensive drug monitoring due to high concerns for Adverse effects of , including  Drug Induced Liver Injury, significant GI side effects that include but not limited to Diarrhea, Nausea, Vomiting,  and other system side effects that include Fatigue, headaches, weight loss and other side effects such as skin rash. These will be monitored with  blood work such as LFT initially once a month for 6  months and then quarterly   Type of visit: Video Virtual Visit Identification of patient Luke Golden with [birth year] and MRN [id number] - 2 person identifier Risks: Risks, benefits, limitations of telephone visit explained. Patient understood and verbalized agreement to proceed Anyone else on call: He and his wife Patient location: His home This provider location: 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 -    IPF: Dyspnea stable.  Pulmonary function test stable.  Did CT scan of the chest in July 2024 and the pulm function test was stable compared to a year ago.  Did spirometry and DLCO September 2024.  The DLCO is stable but the spirometry FVC might be slightly down.  He feels stable especially after rehab.  I did indicate to him that the pulmonary function test might be showing a decline.  Informed that to the wife as well.  Given the potential decline in lung function he agreed to repeat spirometry and DLCO in 3 months but also in the interim get BNP and echocardiogram.   High risk prescription with pirfenidone: Does not  like to take it but takes them. Wife ensures his compliance. Does get dizzy when he stands up. Told them this is not from medicine. Does feel fatigued but no GI complaints.  The fatigue is better after diarrhea.   Cough:  given dx of cough varian asthma (empiric based on high blood eos but normal feno).  He also has mild associated emphysema.  Is not taking breo because inhaler was "locking up" but cough is less problematic.  Cough is much better. But still wants inahlers.  Agreed to send him a prescription.  Dyspnea: W stable and improved after pulmonary rehabilitation.  He is not desaturating there.    Fatigue: doing pulmonary rehab. Not needing o2 there. Fatigue improved with rehab. Improvement making him fee like a kid and then over-exerts and then gets tired. Apparently he works hard at rehab but then needing frequent re-direction.  Rehab notes reviewed.    SYMPTOM SCALE - ILD 01/27/2022 04/21/2022  08/18/2022 225# - esbreit stoppd x 1 oth 10/06/2022 Back on esbriet since last OV 12/15/2022 Esbriet [facility] refused o2 [facility] yewt to authorize rehab  Current weight       O2 use ra ra  ra ra  Shortness of Breath 0 -> 5 scale with 5 being worst (score 6 If unable to do)      At rest 2 2 2 5 3   Simple tasks - showers, clothes change, eating, shaving 2 1 3 3 3   Household (dishes, doing bed, laundry) 3 2 4 3 3   Shopping 4.5 2 3 3 4   Walking level at own pace 4 1 4 3 4   Walking up Stairs 5 2 4 4 4   Total (30-36) Dyspnea Score 20.5 11 20 21 21   How bad is your cough? 2 2 4 4  - off ace inhibbit Feno 8 ppbc, cug 5, start breo empiric  How bad is your fatigue 4 intermitten 4 0 4  How bad is nausea 0 0 0 0 0  How bad is vomiting?  0 0 0 0 0  How bad is diarrhea? 0 0 0 0   How bad is anxiety? 2 3 4 2  0  How bad is depression 4 3 4 4 2   Any chronic pain - if so where and how bad  x x x x 2       Simple office walk 185 feet x  3 laps goal with forehead probe 11/18/2021  04/21/2022  10/06/2022   12/15/2022 FENO 8ppbc  O2 used ra ra ra   Number laps completed 3 3 2    Comments about pace Mod pace mod Sist stand x 10   Resting Pulse Ox/HR 100%% and 67/min 100% and 65 98% and HR 78   Final Pulse Ox/HR 98% and 107/min 99% and HR 98 87% and HR 91   Desaturated </= 88% no no yes   Desaturated <= 3% points no no yes   Got Tachycardic >/= 90/min yes yes yes   Symptoms at end of test None none 3 of 10 dyspnea   Miscellaneous comments x  [facility] refused o2 Rx    PFT     Latest Ref Rng & Units 02/08/2023   11:38 AM 12/15/2022    9:51 AM 04/21/2022    8:41 AM 12/22/2021   11:50 AM  PFT Results  FVC-Pre L 3.32  3.49  3.73  3.80   FVC-Predicted Pre % 86  91  96  98   Pre FEV1/FVC % % 79  81  83  83   FEV1-Pre L 2.62  2.81  3.11  3.13   FEV1-Predicted Pre % 95  102  112  112   DLCO uncorrected ml/min/mmHg 16.10  16.34  18.04  17.19   DLCO [facility]% % 69  70  76  73   DLCO corrected ml/min/mmHg 16.10  16.34  18.04  17.19   DLCO COR %Predicted % 69  70  76  73   DLVA Predicted % 77  78  80  81   TLC L    5.98   TLC % Predicted %    89   RV % Predicted %    91      LAB RESULTS last 96 hours No results found.  LAB RESULTS last 90 days Recent Results (from the past 2160 hour(s))  Pulmonary function test     Status: None   Collection Time: 12/15/22  9:51 AM  Result Value Ref Range   FVC-Pre 3.49 L   FVC-%Pred-Pre 91 %   FEV1-Pre 2.81 L   FEV1-%Pred-Pre 102 %   FEV6-Pre 3.49 L   FEV6-%Pred-Pre 97 %   Pre FEV1/FVC ratio 81 %   FEV1FVC-%Pred-Pre 111 %   Pre FEV6/FVC Ratio 100 %   FEV6FVC-%Pred-Pre 107 %   FEF 25-75 Pre 2.90 L/sec   FEF2575-%Pred-Pre 149 %   DLCO unc 16.34 ml/min/mmHg   DLCO unc % pred 70 %   DLCO cor 16.34 ml/min/mmHg   DLCO cor % pred 70 %   DL/VA 1.61 ml/min/mmHg/L   DL/VA % pred 78 %  POCT EXHALED NITRIC OXIDE     Status: Normal   Collection Time: 12/15/22 11:41 AM  Result Value Ref Range   FeNO level (ppb) 8   Hepatic function panel     Status: None    Collection Time: 12/15/22 11:52 AM  Result Value Ref Range   Total Bilirubin 0.5 0.2 - 1.2 mg/dL   Bilirubin, Direct 0.1 0.0 - 0.3 mg/dL   Alkaline Phosphatase 93 39 - 117 U/L   AST 28 0 - 37 U/L   ALT 18 0 - 53 U/L   Total Protein 7.7 6.0 - 8.3 g/dL   Albumin 4.4 3.5 - 5.2 g/dL  Pulmonary function test  Status: None   Collection Time: 02/08/23 11:38 AM  Result Value Ref Range   FVC-Pre 3.32 L   FVC-%Pred-Pre 86 %   FEV1-Pre 2.62 L   FEV1-%Pred-Pre 95 %   FEV6-Pre 3.32 L   FEV6-%Pred-Pre 93 %   Pre FEV1/FVC ratio 79 %   FEV1FVC-%Pred-Pre 109 %   Pre FEV6/FVC Ratio 100 %   FEV6FVC-%Pred-Pre 107 %   FEF 25-75 Pre 2.41 L/sec   FEF2575-%Pred-Pre 124 %   DLCO unc 16.10 ml/min/mmHg   DLCO unc % pred 69 %   DLCO cor 16.10 ml/min/mmHg   DLCO cor % pred 69 %   DL/VA 1.91 ml/min/mmHg/L   DL/VA % pred 77 %         has a past medical history of Anxiety, Arthritis, Headache(784.0), History of colonic polyps, Hypertension, Peripheral vascular disease (HCC), Pneumonia, Pneumothorax, Pulmonary fibrosis (HCC), Short-term memory loss, Shortness of breath, and Stroke (HCC).   reports that he quit smoking about 26 years ago. His smoking use included cigarettes and cigars. He started smoking about 58 years ago. He has a 48 pack-year smoking history. He has quit using smokeless tobacco.  His smokeless tobacco use included chew.  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/23/2011   Procedure: TOTAL KNEE ARTHROPLASTY;  Surgeon: Loreta Ave, MD;  Location: [facility] OR;  Service: Orthopedics;  Laterality: Left;   TOTAL KNEE ARTHROPLASTY Right 07/12/2013   Procedure:  TOTAL KNEE ARTHROPLASTY;  Surgeon: Loreta Ave, MD;  Location[facility] OR;  Service: Orthopedics;  Laterality: Right;    Allergies  Allergen Reactions   Lisinopril Cough   Cephalosporins Hives   Tylenol [Acetaminophen] Other (See Comments)    Feel weird      Immunization History  Administered Date(s) Administered   DTaP 09/01/2013   Fluad Quad(high Dose 65+) 05/07/2020   Influenza, High Dose Seasonal PF 04/27/2016, 04/13/2017, 04/03/2022   Influenza-Unspecified 08/10/2012, 03/14/2014   Moderna Covid-19 Fall Seasonal Vaccine 88yrs & older 01/01/2023   Pfizer Covid-19 Vaccine Bivalent Booster 42yrs & up 01/01/2022   Pneumococcal Conjugate-13 07/20/2014   Pneumococcal-Unspecified 03/22/2012   Rsv, Bivalent, Protein Subunit Rsvpref,pf Verdis Frederickson) 05/13/2022   Td 12/26/2021   Tdap 03/22/2012   Zoster Recombinant(Shingrix) 12/29/2017, 03/17/2018    Family History  Problem Relation Age of Onset   Heart disease Mother    Heart disease Father    Heart disease Brother    Colon cancer Neg Hx    Colon polyps Neg Hx    Esophageal cancer Neg Hx    Pancreatic cancer Neg Hx    Stomach cancer Neg Hx      Current Outpatient Medications:    albuterol (VENTOLIN HFA) 108 (90 Base) MCG/ACT inhaler, Inhale 2 puffs into the lungs every 6 (six) hours as needed for wheezing or shortness of breath., Disp: 8 g, Rfl: 2   amLODipine (NORVASC) 10 MG tablet, Take 10 mg by mouth daily. (Patient not taking: Reported on 01/11/2023), Disp: , Rfl:    aspirin EC 81 MG tablet, Take 81  mg by mouth daily., Disp: , Rfl:    busPIRone (BUSPAR) 10 MG tablet, Take 15-30 mg by mouth See admin instructions. Take 15 mg by mouth in the morning and take 30 mg by mouth at bedtime (Patient not taking: Reported on 12/15/2022), Disp: , Rfl:    divalproex (DEPAKOTE) 250 MG DR tablet, Take 250 mg by mouth., Disp: , Rfl:    famotidine (PEPCID) 20 MG tablet, Take 1 tablet (20 mg total) by mouth at bedtime., Disp: 30 tablet, Rfl: 5    fluticasone furoate-vilanterol (BREO ELLIPTA) 100-25 MCG/ACT AEPB, Inhale 1 puff into the lungs daily., Disp: 60 each, Rfl: 5   memantine (NAMENDA) 10 MG tablet, TAKE ONE TABLET BY MOUTH AT BEDTIME TO SLOW MEMORY LOSS, Disp: , Rfl:    omeprazole (PRILOSEC) 40 MG capsule, Take 1 capsule (40 mg total) by mouth daily. To be taken 30 minutes before breakfast, Disp: 90 capsule, Rfl: 1   Pirfenidone (ESBRIET) 267 MG TABS, Take 3 tablets (801 mg total) by mouth 3 (three) times daily with meals., Disp: 810 tablet, Rfl: 1   rosuvastatin (CRESTOR) 20 MG tablet, Take 10 mg by mouth daily. , Disp: , Rfl:    sildenafil (VIAGRA) 100 MG tablet, TAKE ONE TABLET BY MOUTH AS DIRECTED (TAKE 1 HOUR PRIOR TO SEXUAL ACTIVITY *DO NOT EXCEED 1 DOSE PER 24 HOUR PERIOD*), Disp: , Rfl:    simethicone (MYLICON) 80 MG chewable tablet, , Disp: , Rfl:       Objective:   There were no vitals filed for this visit.  Estimated body mass index is 33.62 kg/m as calculated from the following:   Height as of 12/15/22: 5\' 8"  (1.727 m).   Weight as of 03/02/23: 221 lb 1.9 oz (100.3 kg).  @WEIGHTCHANGE @  There were no vitals filed for this visit.   Physical Exam   General: No distress. Looks wel. O2 at rest: no Cane present: no Sitting in wheel chair: no Frail: no Obese: no Neuro: Alert and Oriented x 3. GCS 15. Speech normal Psych: Pleasant        Assessment:       ICD-10-CM   1. IPF (idiopathic pulmonary fibrosis) (HCC)  V25.366 Pulmonary function test    B Nat Peptide    Hepatic function panel    2. Agent orange exposure  Z77.098     3. DOE (dyspnea on exertion)  R06.09 ECHOCARDIOGRAM COMPLETE    4. Other fatigue  R53.83 ECHOCARDIOGRAM COMPLETE         Plan:     Patient Instructions     ICD-10-CM   1. IPF (idiopathic pulmonary fibrosis) (HCC)  J84.112     2. Agent orange exposure  Z77.098     3. DOE (dyspnea on exertion)  R06.09     4. Other fatigue  R53.83        #IPF  - Clinically  stable on pulmonary function testing and also CT scan of the chest x 1 year July 2023 through July 2024 but the pulmonary function test October 2024 might or might not be stable versus progression slight.  -Overall tolerating pirfenidone well but there is some fatigue which might or might not be related to pirfenidone  -Glad the fatigue is better with pulmonary rehabilitation and her attending pulmonary habitation.  -Not needing oxygen with exercise.  Plan - Check liver function test sometime in the next week  -Continue pirfenidone at full dose but make sure apply sunscreen and space the medication at least  5 to 6 hours apart  -Continue pulmonary rehabilitation.  -Check spirometry and DLCO in 3 months  # Shortness of breath with exertion  Plan - Check echocardiogram next few to several weeks - Check blood BNP some sometime next week  #Chronic cough with wheezing #cough varian asthma with mild emphysema  -This is likely due to pulmonary fibrosis but also cough varian asthma -It continues despite stopping lisinopril - Blood eosinophils high May 2024 but RAST allergy panel is normal -In October 2024 cough is better despite not taking Breo  Plan  - Please continue Robitussin as needed - Refill Breo 101 puff once daily [as requested by you] - continue albuterol as needed  - if this does not work, we can try another strateggy  #Fatigue  - Likely multifactorial with pirfenidone playing a role -Improved after pulmonary rehabilitation  Plan - Continue pulm rehabilitation  #Follow-up - 12 weeks with Dr. Marchelle Gearing   -Symptoms: Walking desaturation test at follow-up  = Return sooner if needed   FOLLOWUP Return in about 3 months (around 06/11/2023) for after Cleda Daub and DLCO, ILD, with Dr Marchelle Gearing, Face to Face Visit.    SIGNATURE    Dr. Kalman Shan, M.D., F.C.C.P,  Pulmonary and Critical Care Medicine Staff Physician, [facility] Center Director -  Interstitial Lung Disease  Program  Pulmonary Fibrosis [facility] [place], [place], [address]  Pager: [phone], If no answer or between  15:00h - 7:00h: call [phone] Telephone: [phone]  4:56 PM 03/11/2023

## 2023-03-11 NOTE — Patient Instructions (Addendum)
ICD-10-CM   1. IPF (idiopathic pulmonary fibrosis) (HCC)  J84.112     2. Agent orange exposure  Z77.098     3. DOE (dyspnea on exertion)  R06.09     4. Other fatigue  R53.83        #IPF  - Clinically stable on pulmonary function testing and also CT scan of the chest x 1 year July 2023 through July 2024 but the pulmonary function test October 2024 might or might not be stable versus progression slight.  -Overall tolerating pirfenidone well but there is some fatigue which might or might not be related to pirfenidone  -Glad the fatigue is better with pulmonary rehabilitation and her attending pulmonary habitation.  -Not needing oxygen with exercise.  Plan - Check liver function test sometime in the next week  -Continue pirfenidone at full dose but make sure apply sunscreen and space the medication at least 5 to 6 hours apart  -Continue pulmonary rehabilitation.  -Check spirometry and DLCO in 3 months  # Shortness of breath with exertion  Plan - Check echocardiogram next few to several weeks - Check blood BNP some sometime next week  #Chronic cough with wheezing #cough varian asthma with mild emphysema  -This is likely due to pulmonary fibrosis but also cough varian asthma -It continues despite stopping lisinopril - Blood eosinophils high May 2024 but RAST allergy panel is normal -In October 2024 cough is better despite not taking Breo  Plan  - Please continue Robitussin as needed - Refill Breo 101 puff once daily [as requested by you] - continue albuterol as needed  - if this does not work, we can try another strateggy  #Fatigue  - Likely multifactorial with pirfenidone playing a role -Improved after pulmonary rehabilitation  Plan - Continue pulm rehabilitation  #Follow-up - 12 weeks with Dr. Marchelle Gearing   -Symptoms: Walking desaturation test at follow-up  = Return sooner if needed

## 2023-03-16 ENCOUNTER — Encounter (HOSPITAL_COMMUNITY): Payer: No Typology Code available for payment source

## 2023-03-16 ENCOUNTER — Telehealth (HOSPITAL_COMMUNITY): Payer: Self-pay

## 2023-03-16 NOTE — Telephone Encounter (Signed)
Pt wife [name] called and stated pt will not be able to attend PR today due to gas pain.

## 2023-03-18 ENCOUNTER — Encounter (HOSPITAL_COMMUNITY): Payer: No Typology Code available for payment source

## 2023-03-18 ENCOUNTER — Telehealth: Payer: Self-pay | Admitting: Internal Medicine

## 2023-03-18 NOTE — Telephone Encounter (Signed)
Pt wife calling in bc she couldnt get pt to come in for his labs, Pt not feeling good, Pt waking up at night with staggered breathing

## 2023-03-23 ENCOUNTER — Telehealth: Payer: Self-pay | Admitting: Internal Medicine

## 2023-03-23 ENCOUNTER — Encounter (HOSPITAL_COMMUNITY)
Admission: RE | Admit: 2023-03-23 | Discharge: 2023-03-23 | Disposition: A | Discharge status: Home / Self Care | Payer: No Typology Code available for payment source | Source: Ambulatory Visit | Attending: Internal Medicine

## 2023-03-23 ENCOUNTER — Telehealth (HOSPITAL_COMMUNITY): Payer: Self-pay | Admitting: *Deleted

## 2023-03-23 DIAGNOSIS — J84112 Idiopathic pulmonary fibrosis: Secondary | ICD-10-CM | POA: Diagnosis not present

## 2023-03-23 NOTE — Telephone Encounter (Signed)
PT's wife calling stating PT is not feeling well. He is dizzy and states it is the meds he is on. She is the sole caregiver and she knows he needs to have the blood work done that Dr. Elvera Lennox ordered. She wonders if there is a mobile service that will come out to draw his blood. Please call her @ [phone]    [phone]

## 2023-03-23 NOTE — Telephone Encounter (Signed)
MR- are you okay with [place] extending his pulm rehab another 12 wks?

## 2023-03-23 NOTE — Telephone Encounter (Signed)
Patient's wife is calling because patient would like for his pulmonary rehab to be extend. He needs a referral to be put in to the [place] to get another 12 weeks of pulmonary rehab.

## 2023-03-23 NOTE — Progress Notes (Signed)
Daily Session Note  Patient Details  Name: Luke Golden MRN: [id number] Date of Birth: [birth year] Referring Provider:   Doristine Devoid Pulmonary Rehab Walk Test from 01/11/2023 in [facility]  Referring Provider Ramaswamy       Encounter Date: 03/23/2023  Check In:  Session Check In - 03/23/23 1328       Check-In   Supervising physician immediately available to respond to emergencies [facility] MD immediately available    Physician(s) Edd Fabian, NP    Location [facility]    Staff Present Elissa Lovett BS, ACSM-CEP, Exercise Physiologist;Kaylee Earlene Plater, MS, ACSM-CEP, Exercise Physiologist;Casey Hermine Messick [place], RD, [place] Gray, RN, BSN    Virtual Visit No    Medication changes reported     No    Fall or balance concerns reported    Yes    Comments Pt uses walker sometimes to get around. Pt will leave walker to the side.    Tobacco Cessation No Change    Warm-up and Cool-down Performed as group-led instruction    Resistance Training Performed Yes    VAD Patient? No    PAD/SET Patient? No      Pain Assessment   Currently in Pain? No/denies    Multiple Pain Sites No             Capillary Blood Glucose: No results found for this or any previous visit (from the past 24 hour(s)).    Social History   Tobacco Use  Smoking Status Former   Current packs/day: 0.00   Average packs/day: 1.5 packs/day for 32.0 years (48.0 ttl pk-yrs)   Types: Cigarettes, Cigars   Start date: 43   Quit date: 1998   Years since quitting: 26.8  Smokeless Tobacco Former   Types: Chew    Goals Met:  Independence with exercise equipment Exercise tolerated well No report of concerns or symptoms today Strength training completed today  Goals Unmet:  Not Applicable  Comments: Service time is from 1313 to 1416  Dr. Mechele Collin is Medical Director for Pulmonary Rehab at [facility].

## 2023-03-23 NOTE — Telephone Encounter (Signed)
Yeah this is fine 

## 2023-03-23 NOTE — Progress Notes (Signed)
Home Exercise Prescription I have reviewed a Home Exercise Prescription with Liz Malady. He is currently not exercising. Encouraged pt to begin walking 15 min or using his cycle ergometer for 15 min. He agreed. Encouraged pt to add 2-3 non rehab days a week. Unfortunately his wife sts he is not motivated. The patient stated that their goals were to get back in shape. We reviewed exercise guidelines, target heart rate during exercise, RPE Scale, weather conditions, endpoints for exercise, warmup and cool down. The patient is encouraged to come to me with any questions. I will continue to follow up with the patient to assist them with progression and safety.  Spent 15 min discussing home exercise plan and goals.  Shani Fitch Harris, [place], ACSM-CEP 03/23/2023 3:25 PM

## 2023-03-23 NOTE — Telephone Encounter (Signed)
Called and spoke with the pt's spouse  She states pt will get his labs that we ordered at video visit 03/11/23 on this Thurs 03/25/23 Nothing further needed

## 2023-03-23 NOTE — Telephone Encounter (Signed)
Called and discussed pt's progress in program with [place]. Luke Golden has unfortunately had inconsistent attendance due to difficulty getting to rehab and not feeling well often. Discussed two options: extending pt in the program or pt focusing on exercising at home. Luke Asp will discuss with Luke Golden and let us know.  Luke Golden BS, ACSM-CEP 03/23/2023 3:40 PM

## 2023-03-25 ENCOUNTER — Encounter (HOSPITAL_COMMUNITY)
Admission: RE | Admit: 2023-03-25 | Discharge: 2023-03-25 | Disposition: A | Discharge status: Home / Self Care | Payer: No Typology Code available for payment source | Source: Ambulatory Visit | Attending: Internal Medicine | Admitting: Internal Medicine

## 2023-03-25 ENCOUNTER — Other Ambulatory Visit (INDEPENDENT_AMBULATORY_CARE_PROVIDER_SITE_OTHER): Payer: No Typology Code available for payment source

## 2023-03-25 DIAGNOSIS — J84112 Idiopathic pulmonary fibrosis: Secondary | ICD-10-CM

## 2023-03-25 LAB — HEPATIC FUNCTION PANEL
ALT: 11 U/L (ref 0–53)
AST: 19 U/L (ref 0–37)
Albumin: 4 g/dL (ref 3.5–5.2)
Alkaline Phosphatase: 77 U/L (ref 39–117)
Bilirubin, Direct: 0.2 mg/dL (ref 0.0–0.3)
Total Bilirubin: 0.8 mg/dL (ref 0.2–1.2)
Total Protein: 7.2 g/dL (ref 6.0–8.3)

## 2023-03-25 LAB — BRAIN NATRIURETIC PEPTIDE: Pro B Natriuretic peptide (BNP): 81 pg/mL (ref 0.0–100.0)

## 2023-03-25 NOTE — Telephone Encounter (Signed)
Both patient and patient's wife, [name](DPR) is aware of recommendations and voiced their understanding.  Nothing further needed.

## 2023-03-25 NOTE — Telephone Encounter (Signed)
Stop esbriet -> they need to call back in 1 week if symptoms resolved

## 2023-03-25 NOTE — Telephone Encounter (Signed)
Called and spoke to patient's spouse, [name](DPR). She stated that patient feels that Esbriet is causing dizziness and stomach pains.  He is currently taking 3 tabs TID.  [name] stated that patient completed labs today, therefore mobile service is not needed. [name] feels that patient is not eating enough. She stated that MR is aware of sx.   Routing to MR as FYI.

## 2023-03-25 NOTE — Progress Notes (Signed)
Incomplete Session Note  Patient Details  Name: Luke Golden MRN: [id number] Date of Birth: Sep 20, 1945 Referring Provider:   Doristine Devoid Pulmonary Rehab Walk Test from 01/11/2023 in [facility]  Referring Provider Tyran Cordes did not complete his rehab session.  Discussed with pt his lack of progress in program and acknowledged his and his wife's difficulty getting to rehab on bus system. Pt walks well with his rollator and would benefit from walking at home for exercise. After discussion pt decided to not continue program and walk at home. Will discharge pt from program. Ethelda Chick BS, ACSM-CEP 03/25/2023 4:03 PM

## 2023-03-25 NOTE — Telephone Encounter (Signed)
Spoke to patient's spouse, [name](DPR) and relayed below message/recommendations. She stated that patient would actually like to hold off on extending pulmonary rehab.  Nothing further needed.

## 2023-03-29 NOTE — Progress Notes (Signed)
Discharge Progress Report  Patient Details  Name: Luke Golden MRN: [id number] Date of Birth: [birth year] Referring Provider:   Doristine Devoid Pulmonary Rehab Walk Test from 01/11/2023 in [facility]  Referring Provider Ramaswamy        Number of Visits: 10  Reason for Discharge:  Early Exit:  Personal  Smoking History:  Social History   Tobacco Use  Smoking Status Former   Current packs/day: 0.00   Average packs/day: 1.5 packs/day for 32.0 years (48.0 ttl pk-yrs)   Types: Cigarettes, Cigars   Start date: 21   Quit date: 1998   Years since quitting: 26.8  Smokeless Tobacco Former   Types: Chew    Diagnosis:  IPF (idiopathic pulmonary fibrosis) (HCC)  ADL UCSD:  Pulmonary Assessment Scores     Row Name 01/11/23 1355         ADL UCSD   ADL Phase Entry     SOB Score total 54       CAT Score   CAT Score 17       mMRC Score   mMRC Score 3              Initial Exercise Prescription:  Initial Exercise Prescription - 01/11/23 1400       Date of Initial Exercise RX and Referring Provider   Date 01/11/23    Referring Provider Ramaswamy    Expected Discharge Date 04/08/23      Recumbant Bike   Level 2    Minutes 15    METs 1.6      NuStep   Level 2    SPM 60    Minutes 15    METs 1.5      Prescription Details   Frequency (times per week) 2    Duration Progress to 30 minutes of continuous aerobic without signs/symptoms of physical distress      Intensity   THRR 40-80% of Max Heartrate 57-114    Ratings of Perceived Exertion 11-13    Perceived Dyspnea 0-4      Progression   Progression Continue progressive overload as per policy without signs/symptoms or physical distress.      Resistance Training   Training Prescription Yes    Weight blue bands    Reps 10-15             Discharge Exercise Prescription (Final Exercise Prescription Changes):  Exercise Prescription  Changes - 03/23/23 1500       Home Exercise Plan   Plans to continue exercise at Home (comment)   walking or cycle ergometer   Frequency Add 2 additional days to program exercise sessions.    Initial Home Exercises Provided 03/23/23             Functional Capacity:  6 Minute Walk     Row Name 01/11/23 1427         6 Minute Walk   Phase Initial     Distance 972 feet     Walk Time 6 minutes     # of Rest Breaks 0     MPH 1.84     METS 1.63     RPE 9     Perceived Dyspnea  1     VO2 Peak 5.69     Symptoms No     Resting HR 59 bpm     Resting BP 148/70     Resting Oxygen Saturation  96 %  Exercise Oxygen Saturation  during 6 min walk 92 %     Max Ex. HR 79 bpm     Max Ex. BP 174/70     2 Minute Post BP 160/70       Interval HR   1 Minute HR 66     2 Minute HR 65     3 Minute HR 73     4 Minute HR 75     5 Minute HR 74     6 Minute HR 79     2 Minute Post HR 67     Interval Heart Rate? Yes       Interval Oxygen   Interval Oxygen? Yes     Baseline Oxygen Saturation % 96 %     1 Minute Oxygen Saturation % 99 %     1 Minute Liters of Oxygen 0 L     2 Minute Oxygen Saturation % 92 %     2 Minute Liters of Oxygen 0 L     3 Minute Oxygen Saturation % 97 %     3 Minute Liters of Oxygen 0 L     4 Minute Oxygen Saturation % 97 %     4 Minute Liters of Oxygen 0 L     5 Minute Oxygen Saturation % 96 %     5 Minute Liters of Oxygen 0 L     6 Minute Oxygen Saturation % 97 %     6 Minute Liters of Oxygen 0 L     2 Minute Post Oxygen Saturation % 99 %     2 Minute Post Liters of Oxygen 0 L              Psychological, QOL, Others - Outcomes: PHQ 2/9:    01/11/2023    1:58 PM 04/02/2022   11:37 AM 01/29/2022    9:15 AM  Depression screen PHQ 2/9  Decreased Interest 3 0 0  Down, Depressed, Hopeless 2 1 0  PHQ - 2 Score 5 1 0  Altered sleeping 3    Tired, decreased energy 3    Change in appetite 3    Feeling bad or failure about yourself  0    Trouble  concentrating 1    Moving slowly or fidgety/restless 0    Suicidal thoughts 0    PHQ-9 Score 15    Difficult doing work/chores Somewhat difficult      Quality of Life:   Personal Goals: Goals established at orientation with interventions provided to work toward goal.  Personal Goals and Risk Factors at Admission - 01/11/23 1321       Core Components/Risk Factors/Patient Goals on Admission    Weight Management Weight Loss;Yes    Intervention Weight Management: Develop a combined nutrition and exercise program designed to reach desired caloric intake, while maintaining appropriate intake of nutrient and fiber, sodium and fats, and appropriate energy expenditure required for the weight goal.;Weight Management: Provide education and appropriate resources to help participant work on and attain dietary goals.;Obesity: Provide education and appropriate resources to help participant work on and attain dietary goals.    Expected Outcomes Short Term: Continue to assess and modify interventions until short term weight is achieved;Long Term: Adherence to nutrition and physical activity/exercise program aimed toward attainment of established weight goal;Weight Loss: Understanding of general recommendations for a balanced deficit meal plan, which promotes 1-2 lb weight loss per week and includes a negative energy balance of [phone] kcal/d;Understanding recommendations  for meals to include 15-35% energy as protein, 25-35% energy from fat, 35-60% energy from carbohydrates, less than 200mg  of dietary cholesterol, 20-35 gm of total fiber daily    Improve shortness of breath with ADL's Yes    Intervention Provide education, individualized exercise plan and daily activity instruction to help decrease symptoms of SOB with activities of daily living.    Expected Outcomes Short Term: Improve cardiorespiratory fitness to achieve a reduction of symptoms when performing ADLs;Long Term: Be able to perform more ADLs  without symptoms or delay the onset of symptoms    Increase knowledge of respiratory medications and ability to use respiratory devices properly  Yes    Intervention Provide education and demonstration as needed of appropriate use of medications, inhalers, and oxygen therapy.    Expected Outcomes Short Term: Achieves understanding of medications use. Understands that oxygen is a medication prescribed by physician. Demonstrates appropriate use of inhaler and oxygen therapy.;Long Term: Maintain appropriate use of medications, inhalers, and oxygen therapy.    Hypertension Yes    Intervention Provide education on lifestyle modifcations including regular physical activity/exercise, weight management, moderate sodium restriction and increased consumption of fresh fruit, vegetables, and low fat dairy, alcohol moderation, and smoking cessation.;Monitor prescription use compliance.    Expected Outcomes Short Term: Continued assessment and intervention until BP is < 140/28mm HG in hypertensive participants. < 130/47mm HG in hypertensive participants with diabetes, heart failure or chronic kidney disease.;Long Term: Maintenance of blood pressure at goal levels.              Personal Goals Discharge:  Goals and Risk Factor Review     Row Name 01/27/23 1423 02/26/23 1004 03/29/23 0904         Core Components/Risk Factors/Patient Goals Review   Personal Goals Review Weight Management/Obesity;Improve shortness of breath with ADL's;Develop more efficient breathing techniques such as purse lipped breathing and diaphragmatic breathing and practicing self-pacing with activity.;Hypertension;Increase knowledge of respiratory medications and ability to use respiratory devices properly. Weight Management/Obesity;Improve shortness of breath with ADL's;Develop more efficient breathing techniques such as purse lipped breathing and diaphragmatic breathing and practicing self-pacing with activity. Weight  Management/Obesity;Improve shortness of breath with ADL's     Review Unable to assess goals. Luke Golden has completed 1 class so far. Goal progressing for weight loss. Luke Golden is working with our dietician for weight loss goals. Goal progressing on improving her shortness of breath with ADLs. Goal progressing on developing more efficient breathing techniques such as purse lipped breathing and diaphragmatic breathing; and practicing self-pacing with activity. Luke Golden has to be reminded to use purse lip breathing when he gets SOB. Goal met for increasing knowledge of respiratory medications and ability to use respiratory devices properly. He has demonstrated proper use of MDI to the respiratory therapist. Goal progressing on hypertension. Eddies B/P's have been stable in class. We will continue to monitor them throughout the program. Luke Golden was discharged from the program on 03/23/23. Luke Golden unfortunately missed a lot of classes due to being sick. He also had to take the bus to class and this was an all day event for him. He stated having to rely on the bus was too much for him and he could exercise at home. Luke Golden did not meet his goals that he had set for the program. He only attended 10 sessions. He was not here enough to make any progress.     Expected Outcomes For Luke Golden to lose weight, improve his shortness of breath with ADLs, develop more  efficient breathing techniques, increase his knowledge of respiratory medications, and have his hypertension well controlled For Luke Golden to lose weight, improve his shortness of breath with ADLs, develop more efficient breathing techniques, and have his hypertension well controlled To continue to exercise and modify his nutrition and lifestyle post discharge              Exercise Goals and Review:  Exercise Goals     Row Name 01/11/23 1331 01/26/23 0931           Exercise Goals   Increase Physical Activity Yes Yes      Intervention Provide advice, education, support and  counseling about physical activity/exercise needs.;Develop an individualized exercise prescription for aerobic and resistive training based on initial evaluation findings, risk stratification, comorbidities and participant's personal goals. Provide advice, education, support and counseling about physical activity/exercise needs.;Develop an individualized exercise prescription for aerobic and resistive training based on initial evaluation findings, risk stratification, comorbidities and participant's personal goals.      Expected Outcomes Short Term: Attend rehab on a regular basis to increase amount of physical activity.;Long Term: Add in home exercise to make exercise part of routine and to increase amount of physical activity.;Long Term: Exercising regularly at least 3-5 days a week. Short Term: Attend rehab on a regular basis to increase amount of physical activity.;Long Term: Add in home exercise to make exercise part of routine and to increase amount of physical activity.;Long Term: Exercising regularly at least 3-5 days a week.      Increase Strength and Stamina Yes Yes      Intervention Provide advice, education, support and counseling about physical activity/exercise needs.;Develop an individualized exercise prescription for aerobic and resistive training based on initial evaluation findings, risk stratification, comorbidities and participant's personal goals. Provide advice, education, support and counseling about physical activity/exercise needs.;Develop an individualized exercise prescription for aerobic and resistive training based on initial evaluation findings, risk stratification, comorbidities and participant's personal goals.      Expected Outcomes Short Term: Increase workloads from initial exercise prescription for resistance, speed, and METs.;Short Term: Perform resistance training exercises routinely during rehab and add in resistance training at home;Long Term: Improve cardiorespiratory  fitness, muscular endurance and strength as measured by increased METs and functional capacity ( ) Short Term: Increase workloads from initial exercise prescription for resistance, speed, and METs.;Short Term: Perform resistance training exercises routinely during rehab and add in resistance training at home;Long Term: Improve cardiorespiratory fitness, muscular endurance and strength as measured by increased METs and functional capacity ( )      Able to understand and use rate of perceived exertion (RPE) scale Yes Yes      Intervention Provide education and explanation on how to use RPE scale Provide education and explanation on how to use RPE scale      Expected Outcomes Short Term: Able to use RPE daily in rehab to express subjective intensity level;Long Term:  Able to use RPE to guide intensity level when exercising independently Short Term: Able to use RPE daily in rehab to express subjective intensity level;Long Term:  Able to use RPE to guide intensity level when exercising independently      Able to understand and use Dyspnea scale Yes Yes      Intervention Provide education and explanation on how to use Dyspnea scale Provide education and explanation on how to use Dyspnea scale      Expected Outcomes Short Term: Able to use Dyspnea scale daily in rehab to express subjective sense  of shortness of breath during exertion;Long Term: Able to use Dyspnea scale to guide intensity level when exercising independently Short Term: Able to use Dyspnea scale daily in rehab to express subjective sense of shortness of breath during exertion;Long Term: Able to use Dyspnea scale to guide intensity level when exercising independently      Knowledge and understanding of Target Heart Rate Range (THRR) Yes Yes      Intervention Provide education and explanation of THRR including how the numbers were predicted and where they are located for reference Provide education and explanation of THRR including how the  numbers were predicted and where they are located for reference      Expected Outcomes Short Term: Able to state/look up THRR;Long Term: Able to use THRR to govern intensity when exercising independently;Short Term: Able to use daily as guideline for intensity in rehab Short Term: Able to state/look up THRR;Long Term: Able to use THRR to govern intensity when exercising independently;Short Term: Able to use daily as guideline for intensity in rehab      Understanding of Exercise Prescription Yes Yes      Intervention Provide education, explanation, and written materials on patient's individual exercise prescription Provide education, explanation, and written materials on patient's individual exercise prescription      Expected Outcomes Short Term: Able to explain program exercise prescription;Long Term: Able to explain home exercise prescription to exercise independently Short Term: Able to explain program exercise prescription;Long Term: Able to explain home exercise prescription to exercise independently               Exercise Goals Re-Evaluation:  Exercise Goals Re-Evaluation     Row Name 01/26/23 0931 02/23/23 0958 03/25/23 1603         Exercise Goal Re-Evaluation   Exercise Goals Review Increase Physical Activity;Able to understand and use Dyspnea scale;Understanding of Exercise Prescription;Increase Strength and Stamina;Knowledge and understanding of Target Heart Rate Range (THRR);Able to understand and use rate of perceived exertion (RPE) scale Increase Physical Activity;Able to understand and use Dyspnea scale;Understanding of Exercise Prescription;Increase Strength and Stamina;Knowledge and understanding of Target Heart Rate Range (THRR);Able to understand and use rate of perceived exertion (RPE) scale Increase Physical Activity;Able to understand and use Dyspnea scale;Understanding of Exercise Prescription;Increase Strength and Stamina;Knowledge and understanding of Target Heart Rate  Range (THRR);Able to understand and use rate of perceived exertion (RPE) scale     Comments Pt missed his first exercise session due to a fall. He is scheduled to begin today. Will progress as tolerated. Luke Golden has completed 6 exercise sessions. He has missed 4 sessions for various reasons. He was exercising on the recumbent stepper for 30 min, level 3, METs 1.9. Last session he walked the track for 5 min, METs 1.92, and did the stepper for 15 min. He is inconsistent and slow to progress. Needs demonstrative and verbal cues for warm up and cool down. Will progress as able. Luke Golden completed 10 exercise sessions. He has missed 4 sessions for various reasons. Recently he walked the track intermittently for 30 min. He is inconsistent and slow to progress. Needs demonstrative and verbal cues for warm up and cool down. He struggled to progress with walking or on equipment and his inconsistency with attendance hindered him as well. Discharged from program to work on his walking at home (transportation is difficult).     Expected Outcomes Through exercise at rehab and home, the patient will decrease shortness of breath with daily activities and feel confident in carrying  out an exercise regimen at home Through exercise at rehab and home, the patient will decrease shortness of breath with daily activities and feel confident in carrying out an exercise regimen at home Through exercise at rehab and home, the patient will decrease shortness of breath with daily activities and feel confident in carrying out an exercise regimen at home              Nutrition & Weight - Outcomes:  Pre Biometrics - 01/11/23 1529       Pre Biometrics   Grip Strength 26 kg              Nutrition:  Nutrition Therapy & Goals - 03/25/23 1555       Nutrition Therapy   Diet Heart Healthy Diet    Drug/Food Interactions Statins/Certain Fruits      Personal Nutrition Goals   Nutrition Goal Patient to improve diet quality by  using the plate method as a guide for meal planning to include lean protein/plant protein, fruits, vegetables, whole grains, nonfat dairy as part of a well-balanced diet.    Comments Luke Golden has decided to stop the program due to difficulty with transportation. Luke Golden has medical history of CAD, OSA, HTN, pulmonary fibrosis, dementia. Luke Golden continues to consume a wide variety of foods including fruit, vegetables, lean proteins. He does snack frequently. He continues Esbriet. He is down 2.4# since starting with our program; will continue to monitor weight throughout pulmonary rehab.  Luke Golden will benefit from participation in pulmonary rehab for for nutrition, exercise, and lifestyle modification.      Intervention Plan   Intervention Prescribe, educate and counsel regarding individualized specific dietary modifications aiming towards targeted core components such as weight, hypertension, lipid management, diabetes, heart failure and other comorbidities.;Nutrition handout(s) given to patient.    Expected Outcomes Short Term Goal: Understand basic principles of dietary content, such as calories, fat, sodium, cholesterol and nutrients.;Long Term Goal: Adherence to prescribed nutrition plan.             Nutrition Discharge:   Education Questionnaire Score:  Knowledge Questionnaire Score - 01/11/23 1400       Knowledge Questionnaire Score   Pre Score 11/18             Goals reviewed with patient; copy given to patient.

## 2023-03-30 ENCOUNTER — Encounter (HOSPITAL_COMMUNITY): Payer: No Typology Code available for payment source

## 2023-03-31 ENCOUNTER — Encounter: Payer: Self-pay | Admitting: Internal Medicine

## 2023-03-31 DIAGNOSIS — R0609 Other forms of dyspnea: Secondary | ICD-10-CM

## 2023-04-01 ENCOUNTER — Encounter (HOSPITAL_COMMUNITY): Payer: No Typology Code available for payment source

## 2023-04-02 MED ORDER — ALBUTEROL SULFATE HFA 108 (90 BASE) MCG/ACT IN AERS: 2.0000 | INHALATION_SPRAY | Freq: Four times a day (QID) | RESPIRATORY_TRACT | 3 refills | Status: AC | PRN

## 2023-04-02 NOTE — Addendum Note (Signed)
Addended by: Bonney Leitz on: 04/02/2023 06:06 PM   Modules accepted: Orders

## 2023-04-03 NOTE — Telephone Encounter (Signed)
New mychart message sent by pt's spouse:  Talitha Givens [facility] Pool (supporting Kalman Shan, MD)9 hours ago (5:14 AM)   ED Hello,   Is Dr. Marchelle Gearing concerned with blood work? Are the liver enzymes trending up or staying the same? Also, when do I schedule Eddie's next appt?    Thank you,   [name]   Dr. Marchelle Gearing, please advise.

## 2023-04-05 ENCOUNTER — Telehealth: Payer: Self-pay | Admitting: Internal Medicine

## 2023-04-05 DIAGNOSIS — J84112 Idiopathic pulmonary fibrosis: Secondary | ICD-10-CM

## 2023-04-05 NOTE — Telephone Encounter (Signed)
LFTs are normal March 25, 2023.  Also BNP normal suggesting heart is not congested.  Okay to rechallenge with pirfenidone because it appears the dizziness is not related to pirfenidone  Plan - Okay to restart pirfenidone.  If it is just been 1 week since he last stopped he can just go back to taking it just as he was before.  However, if it has been 2 weeks since he stopped he has to start titration all over again  Recent Results (from the past 2160 hour(s))  Pulmonary function test     Status: None   Collection Time: 02/08/23 11:38 AM  Result Value Ref Range   FVC-Pre 3.32 L   FVC-%Pred-Pre 86 %   FEV1-Pre 2.62 L   FEV1-%Pred-Pre 95 %   FEV6-Pre 3.32 L   FEV6-%Pred-Pre 93 %   Pre FEV1/FVC ratio 79 %   FEV1FVC-%Pred-Pre 109 %   Pre FEV6/FVC Ratio 100 %   FEV6FVC-%Pred-Pre 107 %   FEF 25-75 Pre 2.41 L/sec   FEF2575-%Pred-Pre 124 %   DLCO unc 16.10 ml/min/mmHg   DLCO unc % pred 69 %   DLCO cor 16.10 ml/min/mmHg   DLCO cor % pred 69 %   DL/VA 9.14 ml/min/mmHg/L   DL/VA % pred 77 %  Hepatic function panel     Status: None   Collection Time: 03/25/23 10:57 AM  Result Value Ref Range   Total Bilirubin 0.8 0.2 - 1.2 mg/dL   Bilirubin, Direct 0.2 0.0 - 0.3 mg/dL   Alkaline Phosphatase 77 39 - 117 U/L   AST 19 0 - 37 U/L   ALT 11 0 - 53 U/L   Total Protein 7.2 6.0 - 8.3 g/dL   Albumin 4.0 3.5 - 5.2 g/dL  B Nat Peptide     Status: None   Collection Time: 03/25/23 10:57 AM  Result Value Ref Range   Pro B Natriuretic peptide (BNP) 81.0 0.0 - 100.0 pg/mL

## 2023-04-05 NOTE — Telephone Encounter (Signed)
Patient needs another refill of Esbriet. It is an ongoing medicine that he takes and only has one refill left.

## 2023-04-06 ENCOUNTER — Encounter (HOSPITAL_COMMUNITY): Payer: No Typology Code available for payment source

## 2023-04-06 MED ORDER — PIRFENIDONE 267 MG PO TABS: 801.0000 mg | ORAL_TABLET | Freq: Three times a day (TID) | ORAL | 1 refills | Status: DC

## 2023-04-06 NOTE — Telephone Encounter (Signed)
Spoke to patient's wife(DPR) she is aware of below message/recommendations and voiced her understanding.  She stated that patient has already resumed Esbriet. He was not off of it >1wk.  Refill sent per wife's request. Nothing further needed.

## 2023-04-06 NOTE — Telephone Encounter (Signed)
Esbriet sent to preferred pharmacy.  Patient's wife is aware and voiced her understanding.  Nothing further needed.

## 2023-04-08 ENCOUNTER — Encounter (HOSPITAL_COMMUNITY): Payer: No Typology Code available for payment source

## 2023-04-14 ENCOUNTER — Ambulatory Visit (HOSPITAL_COMMUNITY): Payer: No Typology Code available for payment source

## 2023-05-13 ENCOUNTER — Other Ambulatory Visit (HOSPITAL_COMMUNITY): Payer: No Typology Code available for payment source

## 2023-05-18 ENCOUNTER — Telehealth: Payer: Self-pay | Admitting: Internal Medicine

## 2023-05-18 NOTE — Telephone Encounter (Signed)
A letter from the patient read by his wife:  "I Luke Golden. Deeds on September 17,2024 have decided to not take any medication in the morning. I want to take all my meds at night because they are causing me to fall. I advise [name] my wife and caregiver to call Dr.Ramswamy's office and tell him to call me. I am taking too many drugs and they are causing me to fall. On the advise on talking to my pastor and son I believe I need to do what they say. [name] has no responsibility in this decision and Im signing this on December 17th 2024 to release any ramification that come from my decision."

## 2023-05-21 NOTE — Telephone Encounter (Signed)
I called the pt and there was no answer- LMTCB. ?

## 2023-05-24 NOTE — Telephone Encounter (Signed)
Pt wife is calling in to get his CT scan results sent over to the [place] before Dec. 26. Informed her that she is not listed on Pt DPR and the call will be returned on his phone. Pt expressed understanding  Fax: [phone] ATTN: Medical Records Acct ID#: [id number]

## 2023-05-24 NOTE — Telephone Encounter (Signed)
Please see phone encounter on 05/18/23.

## 2023-05-28 NOTE — Telephone Encounter (Signed)
Yes [name] is the DPOA. She has been coming for the visits. I respect what he is saying. Will discuss 06/10/23

## 2023-05-31 ENCOUNTER — Telehealth: Payer: Self-pay | Admitting: Internal Medicine

## 2023-05-31 ENCOUNTER — Ambulatory Visit (HOSPITAL_COMMUNITY): Payer: No Typology Code available for payment source | Attending: Internal Medicine

## 2023-05-31 DIAGNOSIS — R5383 Other fatigue: Secondary | ICD-10-CM

## 2023-05-31 DIAGNOSIS — R0609 Other forms of dyspnea: Secondary | ICD-10-CM | POA: Diagnosis not present

## 2023-05-31 LAB — ECHOCARDIOGRAM COMPLETE
Area-P 1/2: 2.8 cm2
S' Lateral: 3.9 cm

## 2023-05-31 NOTE — Telephone Encounter (Signed)
Patient's wife is calling to see if X-ray needed to be sent to the [facility] records (Last Encounter). Please call patient with an update.

## 2023-06-04 NOTE — Telephone Encounter (Signed)
 I tried to call pt, I got a hold on his wife, (who is on DPR to receive info) and she verbally explained how she call our office on 05-31-2023 and did not hear back from us . I explained to the pts wife that due to the holiday's, our office was closed. Pt wife states this is important and needed to be done asap. The pt's wife wants the xray results sent to the [facility] Medical Records. Due to the pt having dementia, the wife is POA over him. We do not have anything on file for the POA paperwork. The pt is seeing Dr Geronimo on 06-10-2023. I told the pt's wife to speak with the Dr then, to clarify any information that is needed, and so a release form can be signed. Pt wife verbalized understanding. Nothing further needed for now.

## 2023-06-07 ENCOUNTER — Other Ambulatory Visit (HOSPITAL_COMMUNITY): Payer: No Typology Code available for payment source

## 2023-06-08 ENCOUNTER — Other Ambulatory Visit (HOSPITAL_COMMUNITY): Payer: No Typology Code available for payment source

## 2023-06-10 ENCOUNTER — Ambulatory Visit (INDEPENDENT_AMBULATORY_CARE_PROVIDER_SITE_OTHER): Payer: No Typology Code available for payment source | Admitting: Internal Medicine

## 2023-06-10 ENCOUNTER — Encounter: Payer: Self-pay | Admitting: Internal Medicine

## 2023-06-10 ENCOUNTER — Ambulatory Visit (HOSPITAL_BASED_OUTPATIENT_CLINIC_OR_DEPARTMENT_OTHER): Payer: No Typology Code available for payment source | Admitting: Internal Medicine

## 2023-06-10 VITALS — BP 155/83 | HR 49 | Ht 68.0 in | Wt 216.0 lb

## 2023-06-10 DIAGNOSIS — R9430 Abnormal result of cardiovascular function study, unspecified: Secondary | ICD-10-CM | POA: Diagnosis not present

## 2023-06-10 DIAGNOSIS — R0609 Other forms of dyspnea: Secondary | ICD-10-CM | POA: Diagnosis not present

## 2023-06-10 DIAGNOSIS — R5383 Other fatigue: Secondary | ICD-10-CM | POA: Diagnosis not present

## 2023-06-10 DIAGNOSIS — J84112 Idiopathic pulmonary fibrosis: Secondary | ICD-10-CM | POA: Diagnosis not present

## 2023-06-10 DIAGNOSIS — Z5181 Encounter for therapeutic drug level monitoring: Secondary | ICD-10-CM | POA: Diagnosis not present

## 2023-06-10 LAB — PULMONARY FUNCTION TEST
DL/VA % pred: 79 %
DL/VA: 3.17 ml/min/mmHg/L
DLCO cor % pred: 74 %
DLCO cor: 16.82 ml/min/mmHg
DLCO unc % pred: 74 %
DLCO unc: 16.82 ml/min/mmHg
FEF 25-75 Pre: 2.91 L/s
FEF2575-%Pred-Pre: 157 %
FEV1-%Pred-Pre: 110 %
FEV1-Pre: 2.9 L
FEV1FVC-%Pred-Pre: 112 %
FEV6-%Pred-Pre: 104 %
FEV6-Pre: 3.56 L
FEV6FVC-%Pred-Pre: 107 %
FVC-%Pred-Pre: 97 %
FVC-Pre: 3.56 L
Pre FEV1/FVC ratio: 81 %
Pre FEV6/FVC Ratio: 100 %

## 2023-06-10 NOTE — Progress Notes (Signed)
 Spirometry and DLCO Performed Today.

## 2023-06-10 NOTE — Progress Notes (Signed)
 OV 11/18/2021  Subjective:  Patient ID: Luke Golden, male , DOB: May 30, 1946 , age 78 y.o. , MRN: [id number] , ADDRESS: [address] [place] [id number] PCP [facility] Patient Care Team: [facility] as PCP - General  This Provider for this visit: Treatment Team:  Attending Provider: Geronimo Amel, MD PCP Dr Delana at [facility]   11/18/2021 -   Chief Complaint  Patient presents with   Consult    Pt recently had a PFT performed and is here today to go over the results. Pt does have complaints of an occasional cough and SOB that is worse with exertion.     HPI Luke Golden 78 y.o. -referred by the [facility].  History is obtained from talking to him and his wife.  They live in [place].  Wife works as a psychologist, occupational at [employer].  He is asked weight no more veteran.  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  Denies any agent orange exposure.  Former smoker.  According to the wife for the last few years he has had exertional fatigue and also shortness of breath.  But insidiously and gradually is gotten worse.  Sometime towards the end of 2022 at the beginning of 2023 he did have a viral syndrome [facility].  Then in the visit with the [facility] in April 2023 it appears that he started complaining of exertional fatigue and shortness of breath.  Was fairly significant.  They did a chest x-ray on him on September 12, 2021.  Only have the report.  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  He has old chest x-ray here that I personally visualized shows some chronic atelectatic changes.  They did a pulmonary function test and it shows normal FEV1 FVC TLC but isolated reduction in diffusion capacity to 64.2% overall wife says that he is got a lot of fatigue dyspnea lower stamina.  Things are getting worse.  His past medical history is significant for  short-term memory loss, left femoral DVT in 2000.  Denies any substance abuse or tobacco abuse alcohol use in the last 20 years.  He has obstructive sleep apnea moderate but he has quit using his CPAP in the last week.  This is because of facial fit issues.  He also has back pain PTSD coronary artery disease hyperlipidemia acid reflux hypertension.   According to him he had a CT scan of the chest but we could not find evidence for this.  According to my CMA has had a echocardiogram at the [facility] but I could not find evidence for this.  He had a good walking desaturation test here.     CT Chest data - Nov 2015  CLINICAL DATA:  Chest pain.   EXAM: PORTABLE CHEST - 1 VIEW   COMPARISON:  July 05, 2013.   FINDINGS: Stable cardiomediastinal silhouette. No pneumothorax or pleural effusion is noted. Minimal bibasilar subsegmental atelectasis is noted. Old right rib fractures are noted.   IMPRESSION: Minimal bibasilar subsegmental atelectasis.     Electronically Signed   By: Lynwood Seip M.D.   On: 04/23/2014 12:29  No results found.  Nuclear Medicine Stress tst 2017  IMPRESSION: 1. No scintigraphic evidence of prior infarction or pharmacologically induced ischemia.   2. Normal left ventricular wall motion.   3. Left ventricular ejection  fraction 59%   4. Non invasive risk stratification*: Low   *2012 Appropriate Use Criteria for Coronary Revascularization Focused Update: J Am Coll Cardiol. 2012;59(9):857-881. http://content.[email or web address].aspx?articleid=[email or web address]     Electronically Signed   By: Norleen Roulette M.D.   On: 01/28/2016 13:07       12/22/2021: Today-follow-up Patient presents today for follow-up after undergoing high-resolution CT scan, ILD panel and pulmonary function testing.  Pulmonary function testing was completed today and showed normal spirometry and lung volumes.  He did have a mild reduction in DLCO at 73%.  He was unable to complete  postbronchodilator spirometry.  ILD panel was negative aside from low positive ANA and QuantiFERON gold.  No active evidence of TB on recent imaging.  He has never been told in the past that he has TB or has been exposed to it.  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  Today, he reports feeling unchanged when compared to when he was here last.  Breathing is overall stable.  He does get winded, primarily with climbing and long distances.  He also has ongoing issues with fatigue; however, he had stopped using his CPAP, which could be a large contributing factor.  He has since restarted and feels like he has gotten more restful sleep and wakes up feeling better in the morning.  He denies any recent fevers, night sweats, hemoptysis, weight loss, anorexia, lower extremity edema, orthopnea. Denies joint pain, dry mouth or eyes. He has yet to complete ONO and echocardiogram.  Never tried any inhalers for his shortness of breath.   IMPRESSION: 1. Findings are indicative of interstitial lung disease, clearly progressive compared to remote prior study from 2012, with a spectrum of findings considered probable usual interstitial pneumonia (UIP) per current ATS guidelines. Repeat high-resolution chest CT is suggested in 12 months to assess for temporal changes in the appearance of the lung parenchyma. 2. Aortic atherosclerosis, in addition to left main and three-vessel coronary artery disease. Assessment for potential risk factor modification, dietary therapy or pharmacologic therapy may be warranted, if clinically indicated. 3. There are calcifications of the aortic valve and mitral annulus. Echocardiographic correlation for evaluation of potential valvular dysfunction may be warranted if clinically indicated. 4. Cholelithiasis.   Aortic Atherosclerosis (ICD10-I70.0).     Electronically Signed   By: Toribio Aye M.D.    On: 12/09/2021 09:38  OV 01/27/2022  Subjective:  Patient ID: Luke Golden, male , DOB: 06/11/1945 , age 27 y.o. , MRN: [id number] , ADDRESS: [address] [place] [id number] PCP [facility] Patient Care Team: [facility] as PCP - General  This Provider for this visit: Treatment Team:  Attending Provider: Geronimo Amel, MD    01/27/2022 -   Chief Complaint  Patient presents with   Follow-up    Pt states he has been doing okay since last visit. Pt is still SOB but states it is about the same since last visit.    HPI LOVE MILBOURNE 78 y.o. -returns for follow-up.  I last saw him a few months ago and then he saw a publishing rights manager.  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  INH and rifampin  have been commenced.  After that he called our office or at least the wife called and reported that he  was having shakes and trembles and frequent falls.  Also complaining of dyspnea on exertion.  Neither of them are able to tell me that if the symptoms are worse than before or of the onset of it started after the starting of INH and rifampin .  Definitely the call happened for the shakes and falls and the tremors after he got started on INH and rifampin .  But they are unable to explicitly tell me the duration.  In addition he was given ILD question in July 2023 by the nurse practitioner.  He was supposed to bring it with him today but I do not have it.  The wife believes that she fill it up and brought it and gave it to us .  He does admit to previous street drug use.  He also admits to previous heavy smoking.  He also admits to agent orange exposure for a year while he was in [place].  Of note the wife works at [employer].  She is now needing to care for him.  [facility] her to care for him.  But she needs a letter of support.  I did indicate to her about the FMLA  option.  But she does want a caregiver support letter.      OV 04/21/2022  Subjective:  Patient ID: Luke Golden, male , DOB: 02-28-1946 , age 8 y.o. , MRN: [id number] , ADDRESS: [address] [place] [id number] PCP [facility] Patient Care Team: [facility] as PCP - General  This Provider for this visit: Treatment Team:  Attending Provider: Geronimo Amel, MD    04/21/2022 -   Chief Complaint  Patient presents with   Follow-up    PFT performed today.  Pt finished taking the TB medication about 1 month ago. Still coughing up phlegm in the mornings that is clear in color. States that he does have complaints of SOB.  HPI WINFRED IIAMS 78 y.o. -returns for follow-up.  He is here to discuss his pulmonary function test and symptoms symptoms are stable.  Pulmonary function test is stable.  He has completed his INH treatment.  Gave him and his wife diagnosis of IPF based on above criteria.  This was reminded and read discussion because the short-term memory loss.  In the interim he did get COVID-19 in September 2023 and was on Paxlovid.  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  There are no new issues.   We discussed several aspects of his care plan.  We discussed clinical trials but given his short-term memory loss and inability to consent and his lack of interest in being a guinea pig they are not interested in that at this point in time.  They might revisit this in the future.  Discussed pirfenidone  and nintedanib.  He does not want to do nintedanib because of the diarrhea side effect.  He feels he is overweight and would rather go with pirfenidone  which would give him some nausea and anorexia and possible weight loss. Discussed the fact is 3 pills 3 times daily and just apply sunscreen and he is to space this out 5 to 6 hours apart.  His wife made notes about all this.  Liver function test monitoring is required.  They decided to go with  pirfenidone .   06/02/2022: Today - follow up - NP Patient presents today for follow up with his wife. He started Esbriet  around 2 weeks ago. He is getting ready to start his  third week and maintenance dose of 3 pills, three times a day. He has tolerated it well so far. His wife does feel like he has been coughing more at night since he started it but otherwise, his symptoms are stable. Cough is only productive in AM with clear sputum. He does feel like his reflux is a little worse than it has been; otherwise, no GI issues. No fevers, chills, night sweats, hemoptysis. He knows he needs to be more active, which he is going to work on.    OV 08/18/2022  Subjective:  Patient ID: Luke Golden, male , DOB: 1946/01/06 , age 53 y.o. , MRN: [id number] , ADDRESS: [address] [place] [id number] PCP [facility] Patient Care Team: [facility] as PCP - General  This Provider for this visit: Treatment Team:  Attending Provider: Geronimo Amel, MD    08/18/2022 -   Chief Complaint  Patient presents with   Follow-up    Pft review, cough all the times started in ( September ) does use robitussin. Post covid.      HPI HURSHEL BOUILLON 78 y.o. -returns for follow-up.  He presents with his wife [name].  His wife [name] is on a wheelchair because she fractured her feet in February 2024.  Normally she is the caregiver driving him but this time he drove her.  He tells me that over a month ago he stopped taking pirfenidone  because he was tired and dizzy and constipated and occasional nausea.  He did not inform his wife about it.  Instead he was giving her the empty pillbox.  She only knew about it today when he revealed this information to me.  But from a shortness of breath standpoint he feels stable.  Shortness of breath score is stable.  However he does have significant amount of cough.  Robitussin controls it.  Wife does not want to pay for over-the-counter Robitussin but is  asking for a prescription so they can get the prescription from the [facility] at low cost/no cost.  However review of his medications reveals that he is taking lisinopril .  He is willing to stop this.  He did agree that if stopping this and switching over to another medication of my recommendation does not help then he is willing to try other medications or further testing.   He was supposed to have interim pulmonary function test.  Apparently was done at the [facility] but we have not obtained it.  CMA reports it is very difficult to get pulmonary function test from the [facility].  OV 10/06/2022  Subjective:  Patient ID: Luke Golden, male , DOB: 1946-02-02 , age 66 y.o. , MRN: [id number] , ADDRESS: [address] [place] [id number] PCP [facility] Patient Care Team: [facility] as PCP - General  This Provider for this visit: Treatment Team:  Attending Provider: Geronimo Amel, MD     10/06/2022 -   Chief Complaint  Patient presents with   Follow-up    F/up on IPF     HPI OSEPH IMBURGIA 78 y.o. -returns for follow-up.  Presents with his wife [name].  After the last visit they complained about pirfenidone  side effect so we switched him to nintedanib but they went home and spoke to the pharmacist and apparently the pharmacist said that he was on borrowed time and therefore he restarted his pirfenidone .  Today says he is tolerating it well.  Although the wife  and he said that he is sleeping a lot and he gets up late.  It is unclear if this got worse after the pirfenidone  start.  It seems like it has been going on for quite a while.  Last visit I also told him to stop his lisinopril  but his cough continues.  It is quite severe.  He takes Robitussin which controls it.  It is unclear if the cough is improved after stopping lisinopril  the wife does say that she does not hear him cough is much but it could be because of Robitussin.  Shortness of breath appears to be the  same but the main complaint is that there is a lot of fatigue and sleepiness.  He is not using his CPAP.  I did a sit/stand test on him 10 times.  He did desaturate to 87/86%.  And he recovered with rest.  He did get a little dyspneic.  He does not have portable oxygen he does not have night oxygen.    OV 12/15/2022  Subjective:  Patient ID: Luke Golden, male , DOB: 10-07-1945 , age 28 y.o. , MRN: [id number] , ADDRESS: [address] [place] [id number] PCP [facility], [facility] Patient Care Team: [facility], [facility] as PCP - General  This Provider for this visit: Treatment Team:  Attending Provider: Geronimo Amel, MD   12/15/2022 -   Chief Complaint  Patient presents with   Follow-up    Pft, ct f/u,      HPI DENNIES COATE 78 y.o. -returns for follow-up.  Presents with his wife.  Wife is independent historian in fact most of the historian.   IPF: Dyspnea stable.  Pulmonary function test stable.  Did CT scan of the chest personally visualized and agree.  IPF itself is stable.  He has been on pirfenidone  for over 6 months at this point.  He and his wife are pleased to hear this  High risk prescription with pirfenidone : Requires intensive therapeutic monitoring because of significant GI side effects and drug-induced liver injury.  He will have liver test today.  Of note wife is concerned that office delayed in getting the approval from the [facility].  The last prescription for the pirfenidone  he has is today.  After this he is getting a overnight shipment.  I have sent a message to the pharmacy team to see if he can do any improvement in process   Cough: Continues to be problematic.  Cough is severe despite stopping lisinopril  last visit.  Allergy panel was negative.  But his blood eosinophils are high although his nitric oxide  test today is normal.  There is some mild emphysema.  We talked about empirically covering this with inhaled corticosteroid/long-acting  beta agonist.  He is open to this idea.  I communicated this with the pharmacy team because they have to get approval from the [facility].  If this does not work we could try gabapentin or Stiolto.  Last resort would be chronic prednisone .  Biologic against eosinophils would be an option but would be very tough to get approval   Dyspnea: With exercise hypoxemia this is stable.  At last visit he had excess hypoxemia with sit/stand test but they could not reproduce it at the [facility] and the [facility] refused to give him oxygen.  Will get him to pulmonary rehabilitation.  Even this the approval from the [facility] has been delayed but if he can do this then we will test his exercise hypoxemia test  at the rehab and we can reassess his oxygen need at that time  Fatigue: This continues to be problematic I do not think pirfenidone  is made this worse.  His fatigue scores are significantly high.  Will see what happens with pulmonary rehabilitation.  CT Chest data from date: 12/14/22  - personally visualized and independently interpreted : yes - my findings are: same as below   OV 06/10/2023  Subjective:  Patient ID: Luke Golden, male , DOB: 01/21/1946 , age 85 y.o. 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   - Esbrrit start nov/dec 2023 -> stopped feb 2024: estarted pirfenidone  08/18/2022   -QuantiFERON gold positive.  Seen Dr. Elaine 01/01/2022 and started on INH and rifampin  x93-month  -Short-term memory loss and wife with significant caregiver burden  -Getting  worse as of January 2025.  -Sleep apnea does not use CPAP  -Severe baseline chronic cough.   - Stop lisinopril  March 2024. - blood eos high 400 cells/mm 10/06/22; negative IgE and RAST allergy panel - normal feno 12/15/2022  - fibrois and some emphyseya on CT    -High risk prescription pirfenidone  with need for intensive therapeutic monitoring  - Esbriet /Pirfenidone  requires intensive drug monitoring due to high concerns for Adverse effects of , including  Drug Induced Liver Injury, significant GI side effects that include but not limited to Diarrhea, Nausea, Vomiting,  and other system side effects that include Fatigue, headaches, weight loss and other side effects such as skin rash. These will be monitored with  blood work such as LFT initially once a month for 6 months and then quarterly   06/10/2023 -   Chief Complaint  Patient presents with   Follow-up    Pt states cough has calmed down, still fatigue. Breathing is okay, using esbriet  and inhalers prn.      HPI HERSHEY KNAUER 78 y.o. -presents for follow-up.  Presents with his wife.  Wife is the main historian.  She states that his dementia is getting worse.  He has intermittent episodes of complete forgetfulness and lucidity.  He also admits that he is not able to remember many things.  In fact his dementia has been so bad that he was not able to complete pulmonary rehabilitation because he was forgetting instructions.  He wants to exercise on his own but his wife is worried that he spending all his day just sedentary.  She does not think he has a memory capacity to exercise on his own.  In addition they both are reporting constant and severe fatigue.  Fatigue is rated as a level 3 out of 5 on the symptom score sheet by the wife but I believe it is more severe than that.  He did trial stopping pirfenidone  and his wife says that stopping pirfenidone  did not make any difference with the fatigue.  He did have an echocardiogram and a BNP.  BNP  was normal echocardiogram had a low normal ejection fraction.  I did explain to him and his wife that that ejection fraction should not make him so  fatigued.  Nevertheless the interested in a cardiology opinion and will make a referral.  [facility] and he is wife tells me that we need to get the [facility] approval.  In terms of his chronic cough this is better.  Currently is tolerating his pirfenidone  at full dose well.  Of note wife once images hardcopy.  I did tell the CMA to facilitate and advised him on the paperwork to get this done.  She wants to take this to the [facility].    SYMPTOM SCALE - ILD 01/27/2022 04/21/2022  08/18/2022 225# - esbreit stoppd x 1 oth 10/06/2022 Back on esbriet  since last OV 12/15/2022 Esbriet  [facility] refused o2 [facility] yewt to authorize rehab 06/10/2023 - wfie filled out   Current weight        O2 use ra ra  ra ra ra  Shortness of Breath 0 -> 5 scale with 5 being worst (score 6 If unable to do)       At rest 2 2 2 5 3 3   Simple tasks - showers, clothes change, eating, shaving 2 1 3 3 3 4   Household (dishes, doing bed, laundry) 3 2 4 3 3 4   Shopping 4.5 2 3 3 4 4   Walking level at own pace 4 1 4 3 4  3.5  Walking up Stairs 5 2 4 4 4 4   Total (30-36) Dyspnea Score 20.5 11 20 21 21 22   How bad is your cough? 2 2 4 4  - off ace inhibbit Feno 8 ppbc, cug 5, start breo empiric   How bad is your fatigue 4 intermitten 4 0 4 2  How bad is nausea 0 0 0 0 0 3  How bad is vomiting?  0 0 0 0 0 0  How bad is diarrhea? 0 0 0 0  0  How bad is anxiety? 2 3 4 2  0 1  How bad is depression 4 3 4 4 2  intermdiat  Any chronic pain - if so where and how bad x x x x 2 Worsening memory loss       Simple office walk 185 feet x  3 laps goal with forehead probe 11/18/2021  04/21/2022  10/06/2022  12/15/2022 FENO 8ppbc  06/10/2023    O2 used ra ra ra ra  Number laps completed 3 3 2    Comments about pace Mod pace mod Sist stand x 10   Resting Pulse Ox/HR 100%% and 67/min 100% and 65 98% and  HR 78   Final Pulse Ox/HR 98% and 107/min 99% and HR 98 87% and HR 91   Desaturated </= 88% no no yes   Desaturated <= 3% points no no yes   Got Tachycardic >/= 90/min yes yes yes   Symptoms at end of test None none 3 of 10 dyspnea   Miscellaneous comments x  [facility] refused o2 Rx        PFT     Latest Ref Rng & Units 06/10/2023    1:00 PM 02/08/2023   11:38 AM 12/15/2022    9:51 AM 04/21/2022    8:41 AM 12/22/2021   11:50 AM  ILD indicators  FVC-Pre L 3.56  P 3.32  3.49  3.73  3.80   FVC-Predicted Pre % 97  P 86  91  96  98   TLC L     5.98   TLC Predicted %     89   DLCO uncorrected ml/min/mmHg 16.82  P 16.10  16.34  18.04  17.19   DLCO UNC %Pred % 74  P 69  70  76  73   DLCO Corrected ml/min/mmHg 16.82  P 16.10  16.34  18.04  17.19   DLCO COR %Pred % 74  P 69  70  76  73     P Preliminary result      IMPRESSIONS  ECHO    1. Left ventricular ejection fraction, by estimation, is 50 to 55%. The  left ventricle has low normal function. The left ventricle has no regional  wall motion abnormalities. Left ventricular diastolic parameters are  consistent with Grade I diastolic  dysfunction (impaired relaxation). The average left ventricular global  longitudinal strain is -16.4 %. The global longitudinal strain is  abnormal.   2. Right ventricular systolic function is normal. The right ventricular  size is normal. There is normal pulmonary artery systolic pressure.   3. Left atrial size was mildly dilated.   4. Mostly posterior mitral annular calcification. The mitral valve is  normal in structure. Trivial mitral valve regurgitation. No evidence of  mitral stenosis. Moderate mitral annular calcification.   5. The aortic valve is tricuspid. Aortic valve regurgitation is not  visualized. No aortic stenosis is present.   6. The inferior vena cava is normal in size with greater than 50%  respiratory variability, suggesting right atrial pressure of 3 mmHg.   LAB RESULTS last 96  hours No results found.  LAB RESULTS last 90 days Recent Results (from the past 2160 hours)  Hepatic function panel     Status: None   Collection Time: 03/25/23 10:57 AM  Result Value Ref Range   Total Bilirubin 0.8 0.2 - 1.2 mg/dL   Bilirubin, Direct 0.2 0.0 - 0.3 mg/dL   Alkaline Phosphatase 77 39 - 117 U/L   AST 19 0 - 37 U/L   ALT 11 0 - 53 U/L   Total Protein 7.2 6.0 - 8.3 g/dL   Albumin 4.0 3.5 - 5.2 g/dL  B Nat Peptide     Status: None   Collection Time: 03/25/23 10:57 AM  Result Value Ref Range   Pro B Natriuretic peptide (BNP) 81.0 0.0 - 100.0 pg/mL  ECHOCARDIOGRAM COMPLETE     Status: None   Collection Time: 05/31/23 11:34 AM  Result Value Ref Range   Area-P 1/2 2.80 cm2   S' Lateral 3.90 cm   Est EF 50 - 55%   Pulmonary function test     Status: None (Preliminary result)   Collection Time: 06/10/23  1:00 PM  Result Value Ref Range   FVC-Pre 3.56 L   FVC-%Pred-Pre 97 %   FEV1-Pre 2.90 L   FEV1-%Pred-Pre 110 %   FEV6-Pre 3.56 L   FEV6-%Pred-Pre 104 %   Pre FEV1/FVC ratio 81 %   FEV1FVC-%Pred-Pre 112 %   Pre FEV6/FVC Ratio 100 %   FEV6FVC-%Pred-Pre 107 %   FEF 25-75 Pre 2.91 L/sec   FEF2575-%Pred-Pre 157 %   DLCO unc 16.82 ml/min/mmHg   DLCO unc % pred 74 %   DLCO cor 16.82 ml/min/mmHg   DLCO cor % pred 74 %   DL/VA 6.82 ml/min/mmHg/L   DL/VA % pred 79 %         has a past medical history of Anxiety, Arthritis, Headache(784.0), History of colonic polyps, Hypertension, Peripheral vascular disease (HCC), Pneumonia, Pneumothorax, Pulmonary fibrosis (HCC), Short-term memory loss, Shortness of breath, and Stroke (HCC).   reports that he quit smoking about 27  years ago. His smoking use included cigarettes and cigars. He started smoking about 59 years ago. He has a 48 pack-year smoking history. He has quit using smokeless tobacco.  His smokeless tobacco use included chew.  Past Surgical History:  Procedure Laterality Date   CARDIAC CATHETERIZATION     2012  - [place],medical therapy- as follow up    COLONOSCOPY W/ BIOPSIES AND POLYPECTOMY     Hx; of   EYE SURGERY     cataracts removed - /w IOL- bilateral    FRACTURE SURGERY     R elbow- 2000   JOINT REPLACEMENT Bilateral    TONSILLECTOMY     as an adult    TOTAL KNEE ARTHROPLASTY  12/23/2011   Procedure: TOTAL KNEE ARTHROPLASTY;  Surgeon: Toribio JULIANNA Chancy, MD;  Location: [facility] OR;  Service: Orthopedics;  Laterality: Left;   TOTAL KNEE ARTHROPLASTY Right 07/12/2013   Procedure: TOTAL KNEE ARTHROPLASTY;  Surgeon: Toribio JULIANNA Chancy, MD;  Location: [facility] OR;  Service: Orthopedics;  Laterality: Right;    Allergies  Allergen Reactions   Lisinopril  Cough   Cephalosporins Hives   Tylenol  [Acetaminophen ] Other (See Comments)    Feel weird      Immunization History  Administered Date(s) Administered   DTaP 09/01/2013   Fluad Quad(high Dose 65+) 05/07/2020   Influenza, High Dose Seasonal PF 04/27/2016, 04/13/2017, 04/03/2022   Influenza-Unspecified 08/10/2012, 03/14/2014   Moderna Covid-19 Fall Seasonal Vaccine 86yrs & older 01/01/2023, 05/14/2023   Pfizer Covid-19 Vaccine Bivalent Booster 16yrs & up 01/01/2022   Pneumococcal Conjugate-13 07/20/2014   Pneumococcal-Unspecified 03/22/2012   Rsv, Bivalent, Protein Subunit Rsvpref,pf [place]) 05/13/2022   Td 12/26/2021   Tdap 03/22/2012   Zoster Recombinant(Shingrix) 12/29/2017, 03/17/2018    Family History  Problem Relation Age of Onset   Heart disease Mother    Heart disease Father    Heart disease Brother    Colon cancer Neg Hx    Colon polyps Neg Hx    Esophageal cancer Neg Hx    Pancreatic cancer Neg Hx    Stomach cancer Neg Hx      Current Outpatient Medications:    albuterol  (VENTOLIN  HFA) 108 (90 Base) MCG/ACT inhaler, Inhale 2 puffs into the lungs every 6 (six) hours as needed for wheezing or shortness of breath., Disp: 25.5 g, Rfl: 3   amLODipine  (NORVASC ) 10 MG tablet, Take 10 mg by mouth daily., Disp: , Rfl:    aspirin  EC  81 MG tablet, Take 81 mg by mouth daily., Disp: , Rfl:    busPIRone  (BUSPAR ) 10 MG tablet, Take 15-30 mg by mouth See admin instructions. Take 15 mg by mouth in the morning and take 30 mg by mouth at bedtime, Disp: , Rfl:    divalproex  (DEPAKOTE ) 250 MG DR tablet, Take 250 mg by mouth., Disp: , Rfl:    famotidine  (PEPCID ) 20 MG tablet, Take 1 tablet (20 mg total) by mouth at bedtime., Disp: 30 tablet, Rfl: 5   fluticasone  furoate-vilanterol (BREO ELLIPTA ) 100-25 MCG/ACT AEPB, Inhale 1 puff into the lungs daily., Disp: 60 each, Rfl: 5   memantine  (NAMENDA ) 10 MG tablet, TAKE ONE TABLET BY MOUTH AT BEDTIME TO SLOW MEMORY LOSS, Disp: , Rfl:    omeprazole  (PRILOSEC) 40 MG capsule, Take 1 capsule (40 mg total) by mouth daily. To be taken 30 minutes before breakfast, Disp: 90 capsule, Rfl: 1   Pirfenidone  (ESBRIET ) 267 MG TABS, Take 3 tablets (801 mg total) by mouth 3 (three) times daily with  meals., Disp: 810 tablet, Rfl: 1   rosuvastatin  (CRESTOR ) 20 MG tablet, Take 10 mg by mouth daily. , Disp: , Rfl:    sildenafil (VIAGRA) 100 MG tablet, TAKE ONE TABLET BY MOUTH AS DIRECTED (TAKE 1 HOUR PRIOR TO SEXUAL ACTIVITY *DO NOT EXCEED 1 DOSE PER 24 HOUR PERIOD*), Disp: , Rfl:    simethicone (MYLICON) 80 MG chewable tablet, , Disp: , Rfl:       Objective:   Vitals:   06/10/23 1505  BP: (!) 155/83  Pulse: (!) 49  SpO2: 97%  Weight: 216 lb (98 kg)  Height: 5' 8 (1.727 m)    Estimated body mass index is 32.84 kg/m as calculated from the following:   Height as of this encounter: 5' 8 (1.727 m).   Weight as of this encounter: 216 lb (98 kg).  @WEIGHTCHANGE @  [employer]   06/10/23 1505  Weight: 216 lb (98 kg)     Physical Exam   General: No distress. Look stabgl O2 at rest: no Cane present: no Sitting in wheel chair: no Frail: no Obese: no Neuro: Alert and Oriented x 3. GCS 15. Speech normal Psych: Pleasant Resp:  Barrel Chest - no.  Wheeze - no, Crackles - no, No overt respiratory  distress CVS: Normal heart sounds. Murmurs - no Ext: Stigmata of Connective Tissue Disease - no HEENT: Normal upper airway. PEERL +. No post nasal drip        Assessment:       ICD-10-CM   1. IPF (idiopathic pulmonary fibrosis) (HCC)  J84.112 Hepatic function panel    Ambulatory referral to Cardiology    Hepatic function panel    CANCELED: Ambulatory referral to Cardiology    2. DOE (dyspnea on exertion)  R06.09 Hepatic function panel    Ambulatory referral to Cardiology    Hepatic function panel    CANCELED: Ambulatory referral to Cardiology    3. Low left ventricular ejection fraction  R94.30 Hepatic function panel    Ambulatory referral to Cardiology    Hepatic function panel    CANCELED: Ambulatory referral to Cardiology    4. Other fatigue  R53.83 Hepatic function panel    Ambulatory referral to Cardiology    Hepatic function panel    CANCELED: Ambulatory referral to Cardiology    5. Medication monitoring encounter  Z51.81 Hepatic function panel    Ambulatory referral to Cardiology    Hepatic function panel    CANCELED: Ambulatory referral to Cardiology         Plan:     Patient Instructions     ICD-10-CM   1. IPF (idiopathic pulmonary fibrosis) (HCC)  J84.112     2. Agent orange exposure  Z77.098     3. DOE (dyspnea on exertion)  R06.09     4. Other fatigue  R53.83        #IPF  - Clinically stable on  -Overall tolerating pirfenidone  well and holiday did not help with fatigue - Unable to do pulmonary rehabiliation due to memory issues   Plan - Check liver function test 06/10/2023   -Continue pirfenidone  at full dose but make sure apply sunscreen and space the medication at least 5 to 6 hours apart   # Shortness of breath with exertion #Low Ejection fraction 55%  Plan - refer cardiology  #Chronic cough with wheezing #cough varian asthma with mild emphysema  -This is likely due to pulmonary fibrosis but also cough varian asthma - Blood  eosinophils high May  2024 but RAST allergy panel is normal -In October 2024 and 06/10/2023 cough is  MILD  with BREO on board  Plan  - Please continue Robitussin as needed - continue albuterol  as needed   - continue BREO scheduled  - if this does not work, we can try another strateggy  #Fatigue  - Likely multifactorial  seems esbreit hoiday did not help  - pulmonary rehabilityation of no help due to memory issues and inability to keep up with class  Plan - await results of cardiac evalaution  #Follow-up - 12 weeks with Dr. Geronimo; 15 min visit   -Symptoms: Walking desaturation test at follow-up  = Return sooner if needed   FOLLOWUP Return in about 3 months (around 09/08/2023) for 15 min visit, with Dr Geronimo, ILD, Face to Face Visit.    SIGNATURE    Dr. Dorethia Geronimo, M.D., F.C.C.P,  Pulmonary and Critical Care Medicine Staff Physician, [facility] Center Director - Interstitial Lung Disease  Program  Pulmonary Fibrosis [facility] [place], [place], [address]  Pager: [phone], If no answer or between  15:00h - 7:00h: call [phone] Telephone: [phone]  5:23 PM 06/10/2023

## 2023-06-10 NOTE — Patient Instructions (Signed)
 Spirometry and DLCO Performed Today.

## 2023-06-10 NOTE — Patient Instructions (Addendum)
 ICD-10-CM   1. IPF (idiopathic pulmonary fibrosis) (HCC)  J84.112     2. Agent orange exposure  Z77.098     3. DOE (dyspnea on exertion)  R06.09     4. Other fatigue  R53.83        #IPF  - Clinically stable on  -Overall tolerating pirfenidone  well and holiday did not help with fatigue - Unable to do pulmonary rehabiliation due to memory issues   Plan - Check liver function test 06/10/2023   -Continue pirfenidone  at full dose but make sure apply sunscreen and space the medication at least 5 to 6 hours apart   # Shortness of breath with exertion #Low Ejection fraction 55%  Plan - refer cardiology  #Chronic cough with wheezing #cough varian asthma with mild emphysema  -This is likely due to pulmonary fibrosis but also cough varian asthma - Blood eosinophils high May 2024 but RAST allergy panel is normal -In October 2024 and 06/10/2023 cough is  MILD  with BREO on board  Plan  - Please continue Robitussin as needed - continue albuterol  as needed   - continue BREO scheduled  - if this does not work, we can try another strateggy  #Fatigue  - Likely multifactorial  seems esbreit hoiday did not help  - pulmonary rehabilityation of no help due to memory issues and inability to keep up with class  Plan - await results of cardiac evalaution  #Follow-up - 12 weeks with Dr. Geronimo; 15 min visit   -Symptoms: Walking desaturation test at follow-up  = Return sooner if needed

## 2023-06-11 LAB — HEPATIC FUNCTION PANEL
ALT: 20 U/L (ref 0–53)
AST: 28 U/L (ref 0–37)
Albumin: 4.4 g/dL (ref 3.5–5.2)
Alkaline Phosphatase: 76 U/L (ref 39–117)
Bilirubin, Direct: 0.1 mg/dL (ref 0.0–0.3)
Total Bilirubin: 0.5 mg/dL (ref 0.2–1.2)
Total Protein: 7.5 g/dL (ref 6.0–8.3)

## 2023-07-18 ENCOUNTER — Encounter: Payer: Self-pay | Admitting: Internal Medicine

## 2023-07-20 NOTE — Telephone Encounter (Signed)
To be addressed by PCP Clinic, Lenn Sink. Sorry to hear about falls

## 2023-07-30 ENCOUNTER — Encounter: Payer: Self-pay | Admitting: Internal Medicine

## 2023-08-03 ENCOUNTER — Emergency Department (HOSPITAL_COMMUNITY): Admission: EM | Admit: 2023-08-03 | Discharge: 2023-08-03 | Discharge status: Against Medical Advice

## 2023-08-03 ENCOUNTER — Other Ambulatory Visit: Payer: Self-pay

## 2023-08-03 DIAGNOSIS — Z5321 Procedure and treatment not carried out due to patient leaving prior to being seen by health care provider: Secondary | ICD-10-CM | POA: Diagnosis not present

## 2023-08-03 DIAGNOSIS — F0390 Unspecified dementia without behavioral disturbance: Secondary | ICD-10-CM | POA: Diagnosis not present

## 2023-08-03 DIAGNOSIS — R55 Syncope and collapse: Secondary | ICD-10-CM | POA: Diagnosis present

## 2023-08-03 LAB — CBC
HCT: 42.6 % (ref 39.0–52.0)
Hemoglobin: 14.7 g/dL (ref 13.0–17.0)
MCH: 33.4 pg (ref 26.0–34.0)
MCHC: 34.5 g/dL (ref 30.0–36.0)
MCV: 96.8 fL (ref 80.0–100.0)
Platelets: 183 10*3/uL (ref 150–400)
RBC: 4.4 MIL/uL (ref 4.22–5.81)
RDW: 12.7 % (ref 11.5–15.5)
WBC: 8.2 10*3/uL (ref 4.0–10.5)
nRBC: 0 % (ref 0.0–0.2)

## 2023-08-03 LAB — BASIC METABOLIC PANEL
Anion gap: 8 (ref 5–15)
BUN: 22 mg/dL (ref 8–23)
CO2: 24 mmol/L (ref 22–32)
Calcium: 9.2 mg/dL (ref 8.9–10.3)
Chloride: 99 mmol/L (ref 98–111)
Creatinine, Ser: 1.22 mg/dL (ref 0.61–1.24)
GFR, Estimated: >60 mL/min (ref >60)
Glucose, Bld: 118 mg/dL — ABNORMAL HIGH (ref 70–99)
Potassium: 4.4 mmol/L (ref 3.5–5.1)
Sodium: 131 mmol/L — ABNORMAL LOW (ref 135–145)

## 2023-08-03 LAB — CBG MONITORING, ED: Glucose-Capillary: 106 mg/dL — ABNORMAL HIGH (ref 70–99)

## 2023-08-03 NOTE — ED Triage Notes (Addendum)
 Pt to ED via [facility] from home c/o witnessed syncopal episode around 3 pm,no fall, was caught, no injury, EMS called, but at time refused transportation, changed his mind later to come to ED.   Pt has hx of dementia , easily agitated but able to answer orientation questions,   No medications given by EMS.   Last VS: BP 140/72, hr60, cbg 153, 99%RA.

## 2023-08-03 NOTE — ED Notes (Signed)
 Pt has visitor who reports is staying with pt, pt ok to go to lobby per EDP

## 2023-08-03 NOTE — ED Provider Triage Note (Signed)
 Emergency Medicine Provider Triage Evaluation Note  Luke Golden , a 78 y.o. male  was evaluated in triage.  Pt complains of syncopal episode at home.  This was witnessed and there was no report of any injuries as the patient was caught by family.  The patient has a history of dementia and is unable to provide any further history.  Review of Systems  Positive: See above Negative: See above  Physical Exam  BP (!) 144/70   Pulse 62   Temp 99 F (37.2 C) (Oral)   Resp 18   Ht 5\' 8"  (1.727 m)   Wt 101.2 kg   SpO2 100%   BMI 33.91 kg/m  Gen:   Awake, no distress   Resp:  Normal effort  MSK:   Moves extremities without difficulty   Medical Decision Making  Medically screening exam initiated at 6:09 PM.  Appropriate orders placed.  Liz Malady was informed that the remainder of the evaluation will be completed by another provider, this initial triage assessment does not replace that evaluation, and the importance of remaining in the ED until their evaluation is complete.     Durwin Glaze, MD 08/03/23 [phone]

## 2023-08-04 ENCOUNTER — Telehealth: Payer: Self-pay | Admitting: *Deleted

## 2023-08-04 ENCOUNTER — Other Ambulatory Visit: Payer: Self-pay

## 2023-08-04 ENCOUNTER — Telehealth

## 2023-08-04 ENCOUNTER — Encounter (HOSPITAL_BASED_OUTPATIENT_CLINIC_OR_DEPARTMENT_OTHER): Payer: Self-pay | Admitting: Internal Medicine

## 2023-08-04 ENCOUNTER — Encounter: Payer: Self-pay | Admitting: Physician Assistant

## 2023-08-04 ENCOUNTER — Observation Stay (HOSPITAL_BASED_OUTPATIENT_CLINIC_OR_DEPARTMENT_OTHER)
Admission: EM | Admit: 2023-08-04 | Discharge: 2023-08-07 | Disposition: A | Discharge status: Home / Self Care | Attending: Student | Admitting: Student

## 2023-08-04 DIAGNOSIS — R296 Repeated falls: Secondary | ICD-10-CM

## 2023-08-04 DIAGNOSIS — R42 Dizziness and giddiness: Principal | ICD-10-CM | POA: Insufficient documentation

## 2023-08-04 DIAGNOSIS — Z87891 Personal history of nicotine dependence: Secondary | ICD-10-CM | POA: Diagnosis not present

## 2023-08-04 DIAGNOSIS — F32A Depression, unspecified: Secondary | ICD-10-CM | POA: Diagnosis present

## 2023-08-04 DIAGNOSIS — E785 Hyperlipidemia, unspecified: Secondary | ICD-10-CM | POA: Insufficient documentation

## 2023-08-04 DIAGNOSIS — G4733 Obstructive sleep apnea (adult) (pediatric): Secondary | ICD-10-CM

## 2023-08-04 DIAGNOSIS — F0390 Unspecified dementia without behavioral disturbance: Secondary | ICD-10-CM | POA: Diagnosis not present

## 2023-08-04 DIAGNOSIS — J84112 Idiopathic pulmonary fibrosis: Secondary | ICD-10-CM | POA: Diagnosis present

## 2023-08-04 DIAGNOSIS — G3189 Other specified degenerative diseases of nervous system: Secondary | ICD-10-CM | POA: Insufficient documentation

## 2023-08-04 DIAGNOSIS — I10 Essential (primary) hypertension: Secondary | ICD-10-CM | POA: Diagnosis not present

## 2023-08-04 DIAGNOSIS — F39 Unspecified mood [affective] disorder: Secondary | ICD-10-CM | POA: Insufficient documentation

## 2023-08-04 DIAGNOSIS — Z7982 Long term (current) use of aspirin: Secondary | ICD-10-CM | POA: Diagnosis not present

## 2023-08-04 DIAGNOSIS — R569 Unspecified convulsions: Principal | ICD-10-CM

## 2023-08-04 DIAGNOSIS — Z79899 Other long term (current) drug therapy: Secondary | ICD-10-CM | POA: Diagnosis not present

## 2023-08-04 DIAGNOSIS — K219 Gastro-esophageal reflux disease without esophagitis: Secondary | ICD-10-CM | POA: Diagnosis not present

## 2023-08-04 DIAGNOSIS — I639 Cerebral infarction, unspecified: Secondary | ICD-10-CM | POA: Insufficient documentation

## 2023-08-04 DIAGNOSIS — E0781 Sick-euthyroid syndrome: Secondary | ICD-10-CM | POA: Insufficient documentation

## 2023-08-04 DIAGNOSIS — Z96653 Presence of artificial knee joint, bilateral: Secondary | ICD-10-CM | POA: Insufficient documentation

## 2023-08-04 DIAGNOSIS — R410 Disorientation, unspecified: Secondary | ICD-10-CM | POA: Insufficient documentation

## 2023-08-04 LAB — URINALYSIS, ROUTINE W REFLEX MICROSCOPIC
Bacteria, UA: NONE SEEN
Bilirubin Urine: NEGATIVE
Glucose, UA: NEGATIVE mg/dL
Ketones, ur: NEGATIVE mg/dL
Leukocytes,Ua: NEGATIVE
Nitrite: NEGATIVE
Protein, ur: NEGATIVE mg/dL
Specific Gravity, Urine: 1.019 (ref 1.005–1.030)
pH: 6.5 (ref 5.0–8.0)

## 2023-08-04 LAB — BASIC METABOLIC PANEL
Anion gap: 7 (ref 5–15)
BUN: 20 mg/dL (ref 8–23)
CO2: 28 mmol/L (ref 22–32)
Calcium: 9.9 mg/dL (ref 8.9–10.3)
Chloride: 102 mmol/L (ref 98–111)
Creatinine, Ser: 1.09 mg/dL (ref 0.61–1.24)
GFR, Estimated: >60 mL/min (ref >60)
Glucose, Bld: 97 mg/dL (ref 70–99)
Potassium: 4.1 mmol/L (ref 3.5–5.1)
Sodium: 137 mmol/L (ref 135–145)

## 2023-08-04 LAB — CBC
HCT: 43.2 % (ref 39.0–52.0)
Hemoglobin: 14.6 g/dL (ref 13.0–17.0)
MCH: 32.6 pg (ref 26.0–34.0)
MCHC: 33.8 g/dL (ref 30.0–36.0)
MCV: 96.4 fL (ref 80.0–100.0)
Platelets: 177 10*3/uL (ref 150–400)
RBC: 4.48 MIL/uL (ref 4.22–5.81)
RDW: 12.8 % (ref 11.5–15.5)
WBC: 6.3 10*3/uL (ref 4.0–10.5)
nRBC: 0 % (ref 0.0–0.2)

## 2023-08-04 LAB — AMMONIA: Ammonia: 29 umol/L (ref 9–35)

## 2023-08-04 NOTE — Telephone Encounter (Signed)
 Pt spouse, [name] called regarding long waits in ER waits and husband needing to be seen.  RNCM suggested utilizing Virtual Urgent Care Visit or E-Visit via MyChart.  [name] unsure of how to set up visit.  RNCM stayed on phone with pt until visit confirmed.  [name] set up appointment today at 3:00pm visit virtual urgent care.

## 2023-08-04 NOTE — ED Triage Notes (Signed)
 Pt POV with wife reporting multiple episodes of weakness/ unresponsiveness after getting out of shower. Seen by EMS each time, advised to be evaluated in ED, went to ED last night but left due to wait times. PCP also ordered outpatient CT scan that was taken two days ago. Axo x4 in triage, no focal deficits noted at this time.

## 2023-08-04 NOTE — ED Provider Notes (Signed)
 [facility] EMERGENCY DEPARTMENT AT [facility] Provider Note   CSN: [id number] Arrival date & time: 08/04/23  1426     History  No chief complaint on file.   Luke Golden is a 78 y.o. male.  78 year old male with a history of dementia (documented as vascular dementia), hypertension, and hyperlipidemia who presents emergency department with multiple episodes of shaking and unresponsiveness.  History obtained per the patient's wife who reports that occasionally in the mornings he will have episodes where he has whole body shaking.  Will last 10 minutes at a time.  Thinks that he is not fully unconscious during these but seems to be drowsy afterwards for 20 to 30 minutes.  Has happened 3 or 4 times in the past few months.  Had an episode yesterday.  States he does not have any chest pain, shortness of breath or palpitations during these events.  No triggering factors aside from thinking that it may be related to getting out of the shower.  Had a head CT 2 days ago that showed multiple old infarcts but no acute findings.  No alcohol use.  No history of tremor.       Home Medications Prior to Admission medications   Medication Sig Start Date End Date Taking? Authorizing Provider  albuterol (VENTOLIN HFA) 108 (90 Base) MCG/ACT inhaler Inhale 2 puffs into the lungs every 6 (six) hours as needed for wheezing or shortness of breath. 04/02/23   Kalman Shan, MD  amLODipine (NORVASC) 10 MG tablet Take 10 mg by mouth daily. 09/01/22   [provider]  aspirin EC 81 MG tablet Take 81 mg by mouth daily.    [provider]  busPIRone (BUSPAR) 10 MG tablet Take 15-30 mg by mouth See admin instructions. Take 15 mg by mouth in the morning and take 30 mg by mouth at bedtime    [provider]  divalproex (DEPAKOTE) 250 MG DR tablet Take 250 mg by mouth. 10/15/22   [provider]  famotidine (PEPCID) 20 MG tablet Take 1 tablet (20 mg total) by mouth at  bedtime. 06/02/22   Cobb, Ruby Cola, NP  fluticasone furoate-vilanterol (BREO ELLIPTA) 100-25 MCG/ACT AEPB Inhale 1 puff into the lungs daily. 12/15/22   Kalman Shan, MD  memantine (NAMENDA) 10 MG tablet TAKE ONE TABLET BY MOUTH AT BEDTIME TO SLOW MEMORY LOSS 09/18/21   [provider]  omeprazole (PRILOSEC) 40 MG capsule Take 1 capsule (40 mg total) by mouth daily. To be taken 30 minutes before breakfast 09/25/22   Arnaldo Natal, NP  Pirfenidone (ESBRIET) 267 MG TABS Take 3 tablets (801 mg total) by mouth 3 (three) times daily with meals. 04/06/23   Kalman Shan, MD  rosuvastatin (CRESTOR) 20 MG tablet Take 10 mg by mouth daily.     [provider]  sildenafil (VIAGRA) 100 MG tablet TAKE ONE TABLET BY MOUTH AS DIRECTED (TAKE 1 HOUR PRIOR TO SEXUAL ACTIVITY *DO NOT EXCEED 1 DOSE PER 24 HOUR PERIOD*) 06/06/21   [provider]  simethicone (MYLICON) 80 MG chewable tablet  11/14/21   [provider]      Allergies    Lisinopril, Cephalosporins, and Tylenol [acetaminophen]    Review of Systems   Review of Systems  Physical Exam Updated Vital Signs BP 135/66   Pulse 66   Temp 98.6 F (37 C)   Resp 16   Ht 5\' 8"  (1.727 m)   Wt 100.7 kg   SpO2 98%  BMI 33.75 kg/m  Physical Exam Vitals and nursing note reviewed.  Constitutional:      General: He is not in acute distress.    Appearance: He is well-developed.  HENT:     Head: Normocephalic and atraumatic.     Right Ear: External ear normal.     Left Ear: External ear normal.     Nose: Nose normal.  Eyes:     Extraocular Movements: Extraocular movements intact.     Conjunctiva/sclera: Conjunctivae normal.     Comments: Right pupil 2 mm.  Left pupil oblong and 4 mm.  Likely chronic due to cataract surgery  Cardiovascular:     Rate and Rhythm: Normal rate and regular rhythm.     Heart sounds: Normal heart sounds.  Pulmonary:     Effort: Pulmonary effort is normal. No respiratory  distress.     Breath sounds: Normal breath sounds.  Musculoskeletal:     Cervical back: Normal range of motion and neck supple.     Right lower leg: No edema.     Left lower leg: No edema.  Skin:    General: Skin is warm and dry.  Neurological:     Mental Status: He is alert. Mental status is at baseline.     Comments: MENTAL STATUS: AAOx3 CRANIAL NERVES: II: Pupils equal and reactive 4 mm BL, no RAPD, no VF deficits III, IV, VI: EOM intact, no gaze preference or deviation, no nystagmus. V: normal sensation to light touch in V1, V2, and V3 segments bilaterally VII: no facial weakness or asymmetry, no nasolabial fold flattening VIII: normal hearing to speech and finger friction IX, X: normal palatal elevation, no uvular deviation XI: 5/5 head turn and 5/5 shoulder shrug bilaterally XII: midline tongue protrusion MOTOR: 5/5 strength in R shoulder flexion, elbow flexion and extension, and grip strength. 5/5 strength in L shoulder flexion, elbow flexion and extension, and grip strength.  5/5 strength in R hip and knee flexion, knee extension, ankle plantar and dorsiflexion. 5/5 strength in L hip and knee flexion, knee extension, ankle plantar and dorsiflexion. SENSORY: Normal sensation to light touch in all extremities COORD: Normal finger to nose and heel to shin, no dysmetria.  Mild intention tremor.  Psychiatric:        Mood and Affect: Mood normal.        Behavior: Behavior normal.     ED Results / Procedures / Treatments   Labs (all labs ordered are listed, but only abnormal results are displayed) Labs Reviewed  BASIC METABOLIC PANEL  CBC  AMMONIA  URINALYSIS, ROUTINE W REFLEX MICROSCOPIC    EKG EKG Interpretation Date/Time:  Wednesday August 04 2023 15:55:43 EST Ventricular Rate:  58 PR Interval:  159 QRS Duration:  100 QT Interval:  420 QTC Calculation: 413 R Axis:   26  Text Interpretation: Sinus rhythm Abnormal R-wave progression, early transition Borderline T  abnormalities, inferior leads Confirmed by Vonita Moss [phone]) on 08/04/2023 4:04:00 PM  Radiology No results found.  Procedures Procedures    Medications Ordered in ED Medications - No data to display  ED Course/ Medical Decision Making/ A&P Clinical Course as of 08/04/23 1805  Wed Aug 04, 2023  1650 Dr Wilford Corner from neurology consulted will review case and get back to [place].  [RP]  1754 Dr Loyce Dys from hospitalist consulted.  [RP]    Clinical Course User Index [RP] Rondel Baton, MD  Medical Decision Making Amount and/or Complexity of Data Reviewed Labs: ordered.  Risk Decision regarding hospitalization.   Luke Golden is a 78 y.o. male with comorbidities that complicate the patient evaluation including dementia (documented as vascular dementia), hypertension, and hyperlipidemia who presents emergency department with multiple episodes of shaking and unresponsiveness.    Initial Ddx:  Tremor, rigors, seizure, arrhythmia, myoclonic jerking  MDM/Course:  Patient presents to the emergency department with shaking episodes of the past few months.  Appears to be worse in the mornings.  Does appear to be confused after them as well.  On exam has an intention tremor but no other acute neurologic abnormalities aside from some pupil irregularities which are likely chronic from his cataract surgery.  CT head without acute abnormality 2 days ago.  Did show some old strokes.  Do not feel that he needs repeat head imaging emergently at this time.  Initial blood work was unremarkable.  Initial EKG without Brugada, long QT, or WPW.  Low suspicion for arrhythmia given the fact that he is confused for 20 to 30 minutes afterwards.  Sounds like it is likely a neurologic process or possibly a seizure.  Does have history of strokes that could predispose him to this.  Discussed with neurology recommended admission to [facility] for EEG and MRI.  Upon re-evaluation was  stable.  Hospitalist to admit the patient.  This patient presents to the ED for concern of complaints listed in HPI, this involves an extensive number of treatment options, and is a complaint that carries with it a high risk of complications and morbidity. Disposition including potential need for admission considered.   Dispo: Admit to Floor  Additional history obtained from spouse Records reviewed Outpatient Clinic Notes The following labs were independently interpreted: Chemistry and show no acute abnormality I personally reviewed and interpreted cardiac monitoring: normal sinus rhythm  I personally reviewed and interpreted the pt's EKG: see above for interpretation  I have reviewed the patients home medications and made adjustments as needed Consults: Hospitalist and Neurology Social Determinants of health:  Elderly  Portions of this note were generated with Scientist, clinical (histocompatibility and immunogenetics). Dictation errors may occur despite best attempts at proofreading.     Final Clinical Impression(s) / ED Diagnoses Final diagnoses:  Seizure-like activity (HCC)  Confusion  Dementia, unspecified dementia severity, unspecified dementia type, unspecified whether behavioral, psychotic, or mood disturbance or anxiety [facility])    Rx / DC Orders ED Discharge Orders     None         Rondel Baton, MD 08/04/23 1805

## 2023-08-05 ENCOUNTER — Observation Stay (HOSPITAL_COMMUNITY)

## 2023-08-05 ENCOUNTER — Encounter (HOSPITAL_COMMUNITY): Payer: Self-pay | Admitting: Internal Medicine

## 2023-08-05 DIAGNOSIS — R42 Dizziness and giddiness: Secondary | ICD-10-CM | POA: Diagnosis present

## 2023-08-05 DIAGNOSIS — R569 Unspecified convulsions: Principal | ICD-10-CM

## 2023-08-05 DIAGNOSIS — E0781 Sick-euthyroid syndrome: Secondary | ICD-10-CM | POA: Diagnosis not present

## 2023-08-05 DIAGNOSIS — F39 Unspecified mood [affective] disorder: Secondary | ICD-10-CM | POA: Diagnosis not present

## 2023-08-05 DIAGNOSIS — I10 Essential (primary) hypertension: Secondary | ICD-10-CM

## 2023-08-05 DIAGNOSIS — K219 Gastro-esophageal reflux disease without esophagitis: Secondary | ICD-10-CM | POA: Diagnosis not present

## 2023-08-05 DIAGNOSIS — Z7982 Long term (current) use of aspirin: Secondary | ICD-10-CM | POA: Diagnosis not present

## 2023-08-05 DIAGNOSIS — Z79899 Other long term (current) drug therapy: Secondary | ICD-10-CM | POA: Diagnosis not present

## 2023-08-05 DIAGNOSIS — J84112 Idiopathic pulmonary fibrosis: Secondary | ICD-10-CM | POA: Diagnosis not present

## 2023-08-05 DIAGNOSIS — I639 Cerebral infarction, unspecified: Secondary | ICD-10-CM | POA: Diagnosis not present

## 2023-08-05 DIAGNOSIS — F0390 Unspecified dementia without behavioral disturbance: Secondary | ICD-10-CM

## 2023-08-05 DIAGNOSIS — F321 Major depressive disorder, single episode, moderate: Secondary | ICD-10-CM | POA: Diagnosis not present

## 2023-08-05 DIAGNOSIS — G4733 Obstructive sleep apnea (adult) (pediatric): Secondary | ICD-10-CM

## 2023-08-05 DIAGNOSIS — G3189 Other specified degenerative diseases of nervous system: Secondary | ICD-10-CM | POA: Diagnosis not present

## 2023-08-05 DIAGNOSIS — Z87891 Personal history of nicotine dependence: Secondary | ICD-10-CM | POA: Diagnosis not present

## 2023-08-05 DIAGNOSIS — R296 Repeated falls: Secondary | ICD-10-CM

## 2023-08-05 DIAGNOSIS — E785 Hyperlipidemia, unspecified: Secondary | ICD-10-CM | POA: Diagnosis not present

## 2023-08-05 DIAGNOSIS — Z96653 Presence of artificial knee joint, bilateral: Secondary | ICD-10-CM | POA: Diagnosis not present

## 2023-08-05 DIAGNOSIS — R410 Disorientation, unspecified: Secondary | ICD-10-CM | POA: Diagnosis not present

## 2023-08-05 LAB — CBC WITH DIFFERENTIAL/PLATELET
Abs Immature Granulocytes: 0.02 10*3/uL (ref 0.00–0.07)
Basophils Absolute: 0.1 10*3/uL (ref 0.0–0.1)
Basophils Relative: 1 %
Eosinophils Absolute: 0.5 10*3/uL (ref 0.0–0.5)
Eosinophils Relative: 8 %
HCT: 38.4 % — ABNORMAL LOW (ref 39.0–52.0)
Hemoglobin: 13.3 g/dL (ref 13.0–17.0)
Immature Granulocytes: 0 %
Lymphocytes Relative: 38 %
Lymphs Abs: 2.6 10*3/uL (ref 0.7–4.0)
MCH: 33 pg (ref 26.0–34.0)
MCHC: 34.6 g/dL (ref 30.0–36.0)
MCV: 95.3 fL (ref 80.0–100.0)
Monocytes Absolute: 0.7 10*3/uL (ref 0.1–1.0)
Monocytes Relative: 11 %
Neutro Abs: 2.9 10*3/uL (ref 1.7–7.7)
Neutrophils Relative %: 42 %
Platelets: 164 10*3/uL (ref 150–400)
RBC: 4.03 MIL/uL — ABNORMAL LOW (ref 4.22–5.81)
RDW: 12.9 % (ref 11.5–15.5)
WBC: 6.8 10*3/uL (ref 4.0–10.5)
nRBC: 0 % (ref 0.0–0.2)

## 2023-08-05 LAB — BLOOD GAS, VENOUS
Acid-Base Excess: 3.9 mmol/L — ABNORMAL HIGH (ref 0.0–2.0)
Bicarbonate: 28.5 mmol/L — ABNORMAL HIGH (ref 20.0–28.0)
O2 Saturation: 88.6 %
Patient temperature: 36.5
pCO2, Ven: 41 mmHg — ABNORMAL LOW (ref 44–60)
pH, Ven: 7.45 — ABNORMAL HIGH (ref 7.25–7.43)
pO2, Ven: 58 mmHg — ABNORMAL HIGH (ref 32–45)

## 2023-08-05 LAB — COMPREHENSIVE METABOLIC PANEL
ALT: 16 U/L (ref 0–44)
AST: 19 U/L (ref 15–41)
Albumin: 3.4 g/dL — ABNORMAL LOW (ref 3.5–5.0)
Alkaline Phosphatase: 76 U/L (ref 38–126)
Anion gap: 6 (ref 5–15)
BUN: 19 mg/dL (ref 8–23)
CO2: 22 mmol/L (ref 22–32)
Calcium: 8.3 mg/dL — ABNORMAL LOW (ref 8.9–10.3)
Chloride: 107 mmol/L (ref 98–111)
Creatinine, Ser: 0.97 mg/dL (ref 0.61–1.24)
GFR, Estimated: >60 mL/min (ref >60)
Glucose, Bld: 78 mg/dL (ref 70–99)
Potassium: 4.1 mmol/L (ref 3.5–5.1)
Sodium: 135 mmol/L (ref 135–145)
Total Bilirubin: 0.5 mg/dL (ref 0.0–1.2)
Total Protein: 6.1 g/dL — ABNORMAL LOW (ref 6.5–8.1)

## 2023-08-05 LAB — TSH: TSH: 7.898 u[IU]/mL — ABNORMAL HIGH (ref 0.350–4.500)

## 2023-08-05 LAB — VITAMIN B12: Vitamin B-12: 1005 pg/mL — ABNORMAL HIGH (ref 180–914)

## 2023-08-05 LAB — MRSA NEXT GEN BY PCR, NASAL: MRSA by PCR Next Gen: NOT DETECTED

## 2023-08-05 MED ORDER — ROSUVASTATIN CALCIUM 5 MG PO TABS
10.0000 mg | ORAL_TABLET | Freq: Every day | ORAL | Status: DC
  Administered 2023-08-05 – 2023-08-07 (×3): 10 mg via ORAL
  Filled 2023-08-05 (×3): qty 2

## 2023-08-05 MED ORDER — DIVALPROEX SODIUM 250 MG PO DR TAB
750.0000 mg | DELAYED_RELEASE_TABLET | Freq: Every day | ORAL | Status: DC
  Administered 2023-08-05 – 2023-08-06 (×2): 750 mg via ORAL
  Filled 2023-08-05 (×2): qty 3

## 2023-08-05 MED ORDER — MEMANTINE HCL 10 MG PO TABS
20.0000 mg | ORAL_TABLET | Freq: Every day | ORAL | Status: DC
  Administered 2023-08-05 – 2023-08-06 (×2): 20 mg via ORAL
  Filled 2023-08-05 (×2): qty 2

## 2023-08-05 MED ORDER — PIRFENIDONE 267 MG PO CAPS
801.0000 mg | ORAL_CAPSULE | Freq: Three times a day (TID) | ORAL | Status: DC
  Administered 2023-08-05: 801 mg via ORAL
  Filled 2023-08-05 (×3): qty 3

## 2023-08-05 MED ORDER — PANTOPRAZOLE SODIUM 40 MG PO TBEC
40.0000 mg | DELAYED_RELEASE_TABLET | Freq: Every day | ORAL | Status: DC
  Administered 2023-08-05 – 2023-08-07 (×3): 40 mg via ORAL
  Filled 2023-08-05 (×3): qty 1

## 2023-08-05 MED ORDER — ASPIRIN 81 MG PO TBEC
81.0000 mg | DELAYED_RELEASE_TABLET | Freq: Every day | ORAL | Status: DC
  Administered 2023-08-05 – 2023-08-07 (×3): 81 mg via ORAL
  Filled 2023-08-05 (×3): qty 1

## 2023-08-05 MED ORDER — MEMANTINE HCL 10 MG PO TABS: 10.0000 mg | ORAL_TABLET | Freq: Every day | ORAL | Status: DC

## 2023-08-05 MED ORDER — AMLODIPINE BESYLATE 5 MG PO TABS
10.0000 mg | ORAL_TABLET | Freq: Every day | ORAL | Status: DC
  Administered 2023-08-05 – 2023-08-07 (×3): 10 mg via ORAL
  Filled 2023-08-05 (×3): qty 2

## 2023-08-05 MED ORDER — GADOBUTROL 1 MMOL/ML IV SOLN
10.0000 mL | Freq: Once | INTRAVENOUS | Status: AC | PRN
  Administered 2023-08-05: 10 mL via INTRAVENOUS

## 2023-08-05 MED ORDER — PIRFENIDONE 267 MG PO TABS: 801.0000 mg | ORAL_TABLET | Freq: Three times a day (TID) | ORAL | Status: DC

## 2023-08-05 MED ORDER — LORAZEPAM 2 MG/ML IJ SOLN
1.0000 mg | Freq: Once | INTRAMUSCULAR | Status: AC | PRN
  Administered 2023-08-05: 1 mg via INTRAVENOUS
  Filled 2023-08-05: qty 1

## 2023-08-05 MED ORDER — FAMOTIDINE 20 MG PO TABS
20.0000 mg | ORAL_TABLET | Freq: Every day | ORAL | Status: DC
  Administered 2023-08-05 – 2023-08-06 (×2): 20 mg via ORAL
  Filled 2023-08-05 (×2): qty 1

## 2023-08-05 MED ORDER — DONEPEZIL HCL 10 MG PO TABS
10.0000 mg | ORAL_TABLET | Freq: Every day | ORAL | Status: DC
  Administered 2023-08-05 – 2023-08-06 (×2): 10 mg via ORAL
  Filled 2023-08-05 (×2): qty 1

## 2023-08-05 MED ORDER — FLUOXETINE HCL 20 MG PO CAPS
30.0000 mg | ORAL_CAPSULE | Freq: Every day | ORAL | Status: DC
  Administered 2023-08-05 – 2023-08-07 (×3): 30 mg via ORAL
  Filled 2023-08-05 (×3): qty 1

## 2023-08-05 NOTE — Progress Notes (Signed)
 PROGRESS NOTE  Luke Golden ZOX:[id number] DOB: [birth year]   PCP: [facility]  Patient is from: Home.  DOA: 08/04/2023 LOS: 0  Chief complaints Chief Complaint  Patient presents with   Weakness     Brief Narrative / Interim history: 78 year old M with PMH of cognitive impairment, pulmonary fibrosis, HTN, OSA not on CPAP and mood disorder presented to ED with frequent shakes and tremors usually with exertion, and admitted with working diagnosis of seizure-like activity.  Reportedly had a normal CT head 2 days prior at the [place].  On further interview, patient reports dizzy spells when he tries to stand for the last few months.  He reports about 5 spells in the last 2 months.  In ED, normal vitals.  CBC, CMP, UA, ammonia and twelve-lead EKG without significant finding.  Neurology consulted.  EEG ordered.  And admitted for further evaluation.  Subjective: Seen and examined earlier this morning.  No major events overnight of this morning.  Currently getting his EEG done.  Has no complaints.  He is awake and alert and oriented x 4.  He reports dizzy spells when he stands up.  Reports about 5 spells in the last 2 months.  Denies fall or LOC.  Denies focal numbness tingling or weakness.  Denies chest pain, shortness of breath, GI or UTI symptoms.  Objective: Vitals:   08/05/23 0115 08/05/23 0200 08/05/23 0312 08/05/23 0812  BP: (!) 123/51 126/76 (!) 155/68 (!) 157/53  Pulse: 70 86 65   Resp: 18 20  19   Temp:   97.6 F (36.4 C) 97.8 F (36.6 C)  TempSrc:   Axillary Oral  SpO2: 93% 97% 94% 94%  Weight:      Height:        Examination:  GENERAL: No apparent distress.  Nontoxic. HEENT: MMM.  Vision and hearing grossly intact.  NECK: Supple.  No apparent JVD.  RESP:  No IWOB.  Fair aeration bilaterally. CVS:  RRR. Heart sounds normal.  ABD/GI/GU: BS+. Abd soft, NTND.  MSK/EXT:  Moves extremities. No apparent deformity. No edema.  SKIN: no apparent skin lesion or  wound NEURO: Awake, alert and oriented x 4.  No apparent focal neuro deficit. PSYCH: Calm. Normal affect.   Procedures:  3/6-normal EEG without seizure or epileptiform discharge.  Microbiology summarized: MRSA PCR screen nonreactive.  Assessment and plan: Dizzy spells/tremors/shakes: Episodic.  Patient reports dizzy spells when he stands up.  Reports about 5 episodes in the last 2 months.  Reportedly normal CT head at the [place] about 2 days prior.  TTE in 05/2023 without significant finding.  Basic labs, UA and EEG unrevealing.  Has history of OSA.  Not compliant with CPAP.  Viagra could contribute to dizzy spells if he is taking.  Denies smoking cigarette, drinking alcohol recreational drug use. -Appreciate neurorecommendations -MRI brain-ordered. -Referral to [facility] neurology movement disorder clinic to assess for possible LBD or early PD with dementia -Check orthostatic vitals-has already been ordered. -Autonomic testing outpatient -Encouraged spouse to take video of future spells if possible. -Check VBG -Fall precaution, PT/OT  Essential hypertension: BP slightly elevated this morning. -Continue home amlodipine 10 mg daily  Cognitive impairment: He is on Namenda outpatient.  Currently awake and alert and oriented x 4. -Continue home Namenda  Mood disorder: Stable -Continue home Depakote -Discontinue Prozac.  Does not seem to take this.  OSA not compliant with CPAP -Check VBG  Hyperlipidemia -Continue Crestor  GERD Continue Pepcid  Obesity Body mass index is  33.75 kg/m. -Encourage lifestyle change to lose weight.         DVT prophylaxis:  SCDs Start: 08/05/23 0644  Code Status: DNR/DNI Family Communication: None at bedside Level of care: Telemetry Medical Status is: Observation The patient remains OBS appropriate and will d/c before 2 midnights.   Final disposition: Likely home once medically cleared. Consultants:  Neurology  55 minutes with more than 50%  spent in reviewing records, counseling patient/family and coordinating care.   Sch Meds:  Scheduled Meds:  amLODipine  10 mg Oral Daily   aspirin EC  81 mg Oral Daily   divalproex  750 mg Oral QHS   donepezil  10 mg Oral QHS   FLUoxetine  30 mg Oral Daily   memantine  20 mg Oral QHS   pantoprazole  40 mg Oral Daily   Pirfenidone  801 mg Oral TID WC   rosuvastatin  10 mg Oral Daily   Continuous Infusions: PRN Meds:.  Antimicrobials: Anti-infectives (From admission, onward)    None        I have personally reviewed the following labs and images: CBC: Recent Labs  Lab 08/03/23 1749 08/04/23 1525 08/05/23 0654  WBC 8.2 6.3 6.8  NEUTROABS  --   --  2.9  HGB 14.7 14.6 13.3  HCT 42.6 43.2 38.4*  MCV 96.8 96.4 95.3  PLT 183 177 164   BMP &GFR Recent Labs  Lab 08/03/23 1749 08/04/23 1525 08/05/23 0654  NA 131* 137 135  K 4.4 4.1 4.1  CL 99 102 107  CO2 24 28 22   GLUCOSE 118* 97 78  BUN 22 20 19   CREATININE 1.22 1.09 0.97  CALCIUM 9.2 9.9 8.3*   Estimated Creatinine Clearance: 72.2 mL/min (by C-G formula based on SCr of 0.97 mg/dL). Liver & Pancreas: Recent Labs  Lab 08/05/23 0654  AST 19  ALT 16  ALKPHOS 76  BILITOT 0.5  PROT 6.1*  ALBUMIN 3.4*   No results for input(s): "LIPASE", "AMYLASE" in the last 168 hours. Recent Labs  Lab 08/04/23 1537  AMMONIA 29   Diabetic: No results for input(s): "HGBA1C" in the last 72 hours. Recent Labs  Lab 08/03/23 1737  GLUCAP 106*   Cardiac Enzymes: No results for input(s): "CKTOTAL", "CKMB", "CKMBINDEX", "TROPONINI" in the last 168 hours. Recent Labs    03/25/23 1057  PROBNP 81.0   Coagulation Profile: No results for input(s): "INR", "PROTIME" in the last 168 hours. Thyroid Function Tests: No results for input(s): "TSH", "T4TOTAL", "FREET4", "T3FREE", "THYROIDAB" in the last 72 hours. Lipid Profile: No results for input(s): "CHOL", "HDL", "LDLCALC", "TRIG", "CHOLHDL", "LDLDIRECT" in the last 72  hours. Anemia Panel: No results for input(s): "VITAMINB12", "FOLATE", "FERRITIN", "TIBC", "IRON", "RETICCTPCT" in the last 72 hours. Urine analysis:    Component Value Date/Time   COLORURINE YELLOW 08/04/2023 2108   APPEARANCEUR CLEAR 08/04/2023 2108   LABSPEC 1.019 08/04/2023 2108   PHURINE 6.5 08/04/2023 2108   GLUCOSEU NEGATIVE 08/04/2023 2108   HGBUR TRACE (A) 08/04/2023 2108   BILIRUBINUR NEGATIVE 08/04/2023 2108   KETONESUR NEGATIVE 08/04/2023 2108   PROTEINUR NEGATIVE 08/04/2023 2108   UROBILINOGEN 0.2 07/05/2013 1342   NITRITE NEGATIVE 08/04/2023 2108   LEUKOCYTESUR NEGATIVE 08/04/2023 2108   Sepsis Labs: Invalid input(s): "PROCALCITONIN", "LACTICIDVEN"  Microbiology: Recent Results (from the past 240 hours)  MRSA Next Gen by PCR, Nasal     Status: None   Collection Time: 08/05/23  7:22 AM   Specimen: Nasal Mucosa; Nasal Swab  Result  Value Ref Range Status   MRSA by PCR Next Gen NOT DETECTED NOT DETECTED Final    Comment: (NOTE) The GeneXpert MRSA Assay (FDA approved for NASAL specimens only), is one component of a comprehensive MRSA colonization surveillance program. It is not intended to diagnose MRSA infection nor to guide or monitor treatment for MRSA infections. Test performance is not FDA approved in patients less than 63 years old. Performed at [facility], [address], [place], [place] [address]     Radiology Studies: EEG adult Result Date: 08/05/2023 Charlsie Quest, MD     08/05/2023 10:01 AM Patient Name: NAI DASCH MRN: [id number] Epilepsy Attending: Charlsie Quest Referring Physician/Provider: Caryl Pina, MD Date: 08/05/2023 Duration: 23.59 mins Patient history: 78yo M with shaking spells with depressed level of consciousness. EEG to evaluate for seizure Level of alertness: Awake AEDs during EEG study: VPA Technical aspects: This EEG study was done with scalp electrodes positioned according to the 10-20 International system of electrode  placement. Electrical activity was reviewed with band pass filter of 1-70Hz , sensitivity of 7 uV/mm, display speed of 64mm/sec with a 60Hz  notched filter applied as appropriate. EEG data were recorded continuously and digitally stored.  Video monitoring was available and reviewed as appropriate. Description: The posterior dominant rhythm consists of 8 Hz activity of moderate voltage (25-35 uV) seen predominantly in posterior head regions, symmetric and reactive to eye opening and eye closing. Physiologic photic driving was seen during photic stimulation. Hyperventilation was not performed.   IMPRESSION: This study is within normal limits. No seizures or epileptiform discharges were seen throughout the recording. A normal interictal EEG does not exclude the diagnosis of epilepsy. Priyanka Annabelle Harman      Rebeca Valdivia T. Strider Vallance [facility]  If 7PM-7AM, please contact night-coverage [email or web address] 08/05/2023, 11:32 AM
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
## 2023-08-05 NOTE — Evaluation (Signed)
 Physical Therapy Evaluation Patient Details Name: Luke Golden MRN: [id number] DOB: 08-03-1945 Today's Date: 08/05/2023  History of Present Illness  CARLYLE MCELRATH is a 78 yo male who presented with new seizure-like activity and dizziness that has been occurring for a few months. EEG and acute work up negative for acute findings. PMH of cognitive impairment, pulmonary fibrosis, HTN, OSA not on CPAP and mood disorder   Clinical Impression  Pt admitted with above diagnosis. PTA pt lived at home with family, amb household distances with cane. Multiple recent falls. Wife reports mobility wax and wanes depending on how pt is feeling. Pt currently with functional limitations due to the deficits listed below (see PT Problem List). On eval, pt required supervision bed mobility, min assist transfers, and min assist amb 25' without AD. He demo fair sitting and standing balance. Orthostatic vitals stable. Pt will benefit from acute skilled PT to increase their independence and safety with mobility to allow discharge.  Upon d/c, pt would benefit from HHPT.         If plan is discharge home, recommend the following: A little help with walking and/or transfers;A little help with bathing/dressing/bathroom;Assist for transportation;Help with stairs or ramp for entrance;Assistance with cooking/housework;Supervision due to cognitive status   Can travel by private vehicle        Equipment Recommendations None recommended by PT  Recommendations for Other Services       Functional Status Assessment Patient has had a recent decline in their functional status and demonstrates the ability to make significant improvements in function in a reasonable and predictable amount of time.     Precautions / Restrictions Precautions Precautions: Fall Recall of Precautions/Restrictions: Impaired Precaution/Restrictions Comments: multiple recent falls Restrictions Other Position/Activity Restrictions: Orthostatic vitals  taken by OT-stable      Mobility  Bed Mobility Overal bed mobility: Needs Assistance Bed Mobility: Supine to Sit, Sit to Supine     Supine to sit: Supervision Sit to supine: Supervision        Transfers Overall transfer level: Needs assistance Equipment used: 1 person hand held assist Transfers: Sit to/from Stand Sit to Stand: Min assist           General transfer comment: assist to power up    Ambulation/Gait Ambulation/Gait assistance: Min assist Gait Distance (Feet): 25 Feet Assistive device: 1 person hand held assist Gait Pattern/deviations: Step-through pattern, Wide base of support, Decreased stride length Gait velocity: decreased Gait velocity interpretation: <1.31 ft/sec, indicative of household ambulator   General Gait Details: Pt declined hallway amb  Stairs            Wheelchair Mobility     Tilt Bed    Modified Rankin (Stroke Patients Only)       Balance Overall balance assessment: Needs assistance Sitting-balance support: Feet supported, Bilateral upper extremity supported Sitting balance-[name] Scale: Fair     Standing balance support: No upper extremity supported, During functional activity Standing balance-[name] Scale: Fair                               Pertinent Vitals/Pain Pain Assessment Pain Assessment: Faces Faces Pain Scale: No hurt    Home Living Family/patient expects to be discharged to:: Private residence Living Arrangements: Spouse/significant other Available Help at Discharge: Family;Available 24 hours/day Type of Home: House Home Access: Ramped entrance       Home Layout: One level Home Equipment: Agricultural consultant (2 wheels);Cane -  single point;Transport chair;BSC/3in1;Shower seat Additional Comments: wife PCG 24/7, PCA present 5 days/wk    Prior Function Prior Level of Function : Needs assist;History of Falls (last six months)             Mobility Comments: amb household distances with  cane. Multiple recents falls. Transport chair in community (and sometimes in home if pt having a bad day) ADLs Comments: wife or PCA assists wtih all ADLS, limited by weakness adn activity tolerance. Wife completes all IADLs.     Extremity/Trunk Assessment   Upper Extremity Assessment Upper Extremity Assessment: Generalized weakness    Lower Extremity Assessment Lower Extremity Assessment: Generalized weakness    Cervical / Trunk Assessment Cervical / Trunk Assessment: Kyphotic  Communication   Communication Communication: No apparent difficulties    Cognition Arousal: Alert Behavior During Therapy: Agitated (mildly agitated, frustrates easily)   PT - Cognitive impairments: History of cognitive impairments                         Following commands: Impaired Following commands impaired: Only follows one step commands consistently     Cueing Cueing Techniques: Verbal cues, Gestural cues, Tactile cues     General Comments General comments (skin integrity, edema, etc.): VSS on RA    Exercises     Assessment/Plan    PT Assessment Patient needs continued PT services  PT Problem List Decreased strength;Decreased balance;Decreased cognition;Decreased mobility;Decreased activity tolerance;Decreased safety awareness       PT Treatment Interventions Functional mobility training;DME instruction;Balance training;Patient/family education;Gait training;Therapeutic activities;Therapeutic exercise    PT Goals (Current goals can be found in the Care Plan section)  Acute Rehab PT Goals Patient Stated Goal: not stated PT Goal Formulation: With patient/family Time For Goal Achievement: 08/19/23 Potential to Achieve Goals: Fair    Frequency Min 2X/week     Co-evaluation               AM-PAC PT "6 Clicks" Mobility  Outcome Measure Help needed turning from your back to your side while in a flat bed without using bedrails?: A Little Help needed moving from  lying on your back to sitting on the side of a flat bed without using bedrails?: A Little Help needed moving to and from a bed to a chair (including a wheelchair)?: A Little Help needed standing up from a chair using your arms (e.g., wheelchair or bedside chair)?: A Little Help needed to walk in hospital room?: A Little Help needed climbing 3-5 steps with a railing? : A Lot 6 Click Score: 17    End of Session Equipment Utilized During Treatment: Gait belt Activity Tolerance: Patient tolerated treatment well Patient left: in bed;with call bell/phone within reach;with family/visitor present Nurse Communication: Mobility status PT Visit Diagnosis: Unsteadiness on feet (R26.81);Muscle weakness (generalized) (M62.81)    Time: 2440-1027 PT Time Calculation (min) (ACUTE ONLY): 16 min   Charges:   PT Evaluation $PT Eval Moderate Complexity: 1 Mod   PT General Charges $$ ACUTE PT VISIT: 1 Visit         Ferd Glassing., PT  Office # [phone]   Ilda Foil 08/05/2023, 1:06 PM

## 2023-08-05 NOTE — ED Notes (Signed)
 Handoff given to inpatient RN

## 2023-08-05 NOTE — Progress Notes (Signed)
 9147 Patient arrived to unit. Patient Aox4 with respirations even and unlabored. Admissions paged to notify provider

## 2023-08-05 NOTE — Care Management Obs Status (Signed)
 MEDICARE OBSERVATION STATUS NOTIFICATION   Patient Details  Name: Luke Golden MRN: [id number] Date of Birth: 1946-04-11   Medicare Observation Status Notification Given:  Yes    Kermit Balo, RN 08/05/2023, 2:08 PM
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
## 2023-08-05 NOTE — Procedures (Signed)
 Patient Name: Luke Golden  MRN: [id number]  Epilepsy Attending: Charlsie Quest  Referring Physician/Provider: Caryl Pina, MD  Date: 08/05/2023 Duration: 23.59 mins  Patient history: 78yo M with shaking spells with depressed level of consciousness. EEG to evaluate for seizure  Level of alertness: Awake  AEDs during EEG study: VPA  Technical aspects: This EEG study was done with scalp electrodes positioned according to the 10-20 International system of electrode placement. Electrical activity was reviewed with band pass filter of 1-70Hz , sensitivity of 7 uV/mm, display speed of 58mm/sec with a 60Hz  notched filter applied as appropriate. EEG data were recorded continuously and digitally stored.  Video monitoring was available and reviewed as appropriate.  Description: The posterior dominant rhythm consists of 8 Hz activity of moderate voltage (25-35 uV) seen predominantly in posterior head regions, symmetric and reactive to eye opening and eye closing. Physiologic photic driving was seen during photic stimulation. Hyperventilation was not performed.     IMPRESSION: This study is within normal limits. No seizures or epileptiform discharges were seen throughout the recording.  A normal interictal EEG does not exclude the diagnosis of epilepsy.  Denver Harder Annabelle Harman

## 2023-08-05 NOTE — TOC Initial Note (Signed)
 Transition of Care [facility]) - Initial/Assessment Note    Patient Details  Name: Luke Golden MRN: [id number] Date of Birth: 10/27/1945  Transition of [facility]) CM/SW Contact:    Kermit Balo, RN Phone Number: 08/05/2023, 2:44 PM  Clinical Narrative:                  Pt is from home with his spouse. Spouse is a paid caregiver through the [place]. He also has another aide that assists at home.  Wife requesting wheelchair and hospital bed for home. CM has send requisition to the [place]. Pt attends [facility]. PCP: Dr Jess Barters  SWEzzard Flax  Home health arranged with [facility]. Information on the AVS. [facility] will contact the patient for the first home visit. Wife or SCAT provides needed transportation.  TOC following.   Expected Discharge Plan: Home w Home Health Services Barriers to Discharge: Continued Medical Work up   Patient Goals and CMS Choice   CMS Medicare.gov Compare Post Acute Care list provided to:: Patient Represenative (must comment) Choice offered to / list presented to : Spouse      Expected Discharge Plan and Services   Discharge Planning Services: CM Consult Post Acute Care Choice: Home Health, Durable Medical Equipment Living arrangements for the past 2 months: Single Family Home                 DME Arranged: Community education officer wheelchair with seat cushion, Hospital bed DME Agency: [facility] [place] Date DME Agency Contacted: 08/05/23   Representative spoke with at DME Agency: via email HH Arranged: PT, OT HH Agency: [facility] Date [facility] Agency Contacted: 08/05/23   Representative spoke with at [facility] Agency: Clifton Custard  Prior Living Arrangements/Services Living arrangements for the past 2 months: Single Family Home Lives with:: Spouse Patient language and need for interpreter reviewed:: Yes Do you feel safe going back to the place where you live?: Yes      Need for Family Participation in Patient Care: Yes (Comment) Care giver support  system in place?: Yes (comment) Current home services: DME (transport chair) Criminal Activity/Legal Involvement Pertinent to Current Situation/Hospitalization: No - Comment as needed  Activities of Daily Living   ADL Screening (condition at time of admission) Independently performs ADLs?: Yes (appropriate for developmental age) Is the patient deaf or have difficulty hearing?: Yes Does the patient have difficulty seeing, even when wearing glasses/contacts?: Yes Does the patient have difficulty concentrating, remembering, or making decisions?: Yes  Permission Sought/Granted                  Emotional Assessment Appearance:: Appears stated age         Psych Involvement: No (comment)  Admission diagnosis:  Confusion [R41.0] Focal seizures (HCC) [R56.9] Seizure-like activity (HCC) [R56.9] Dementia, unspecified dementia severity, unspecified dementia type, unspecified whether behavioral, psychotic, or mood disturbance or anxiety (HCC) [F03.90] Patient Active Problem List   Diagnosis Date Noted   Seizure-like activity (HCC) 08/05/2023   Focal seizures (HCC) 08/04/2023   GERD (gastroesophageal reflux disease) 06/02/2022   TB lung, 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   COVID-19 02/18/2022   Depression 01/29/2022   Recurrent falls 01/29/2022   IPF (idiopathic pulmonary fibrosis) (HCC) 12/22/2021   DOE (dyspnea on exertion) 12/22/2021   Positive QuantiFERON-TB Gold test 12/22/2021   OSA on CPAP 12/22/2021   Neck pain    Left arm pain 01/27/2016   Coronary artery disease 01/27/2016   Essential hypertension 01/27/2016   DJD (degenerative joint disease)  of knee 07/12/2013   PCP:  Clinic, [facility] Pharmacy:   [facility] - [place], [place] - [address] [place] [address] Phone: [phone] Fax: [phone]  [facility] - [place], [place] - [address] [place]  [address] Phone: [phone] Fax: [phone]  [facility] [address] [place]), [place] - [address] [place]) [place] [address] Phone: [phone] Fax: [phone]     Social Drivers of Health (SDOH) Social History: SDOH Screenings   Food Insecurity: No Food Insecurity (08/05/2023)  Housing: Low Risk  (08/05/2023)  Transportation Needs: No Transportation Needs (08/05/2023)  Utilities: Not At Risk (08/05/2023)  Depression (PHQ2-9): High Risk (01/11/2023)  Social Connections: Moderately Integrated (08/05/2023)  Tobacco Use: Medium Risk (08/05/2023)   SDOH Interventions:     Readmission Risk Interventions     No data to display

## 2023-08-05 NOTE — Progress Notes (Signed)
 Patient sent to MRI, but was unable to complete the test stating it was too loud. MD aware.

## 2023-08-05 NOTE — Progress Notes (Addendum)
 EEG complete - results pending. Study completed by EEG [employer]

## 2023-08-05 NOTE — Plan of Care (Signed)
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
## 2023-08-05 NOTE — Evaluation (Signed)
 Occupational Therapy Evaluation Patient Details Name: Luke Golden MRN: [id number] DOB: November 30, 1945 Today's Date: 08/05/2023   History of Present Illness   Luke Golden is a 78 yo male who presented with new seizure-like activity and dizziness that has been occurring for a few months. EEG and acute work up negative for acute findings. PMH of cognitive impairment, pulmonary fibrosis, HTN, OSA not on CPAP and mood disorder     Clinical Impressions [name] was evaluated s/p the above admission list. He needs assist from his wife and PCA for all ADL/IADLs at baseline, he is ambulatory with and without AD in the home and has had multiple recent falls. Upon evaluation the pt was limited by baseline cognitive impairment, fatigue, weakness, limited activity tolerance, impaired balance and self-limiting demeanor. Overall he needed superivsion A and encouragement to come to the EOB and min A to stand with HHA. Due to the deficits listed below the pt also needs up to mod A for LB ADLs and set up A for UB ADLs. Pt will benefit from continued acute OT services and HHOT.      If plan is discharge home, recommend the following:   A little help with walking and/or transfers;A lot of help with bathing/dressing/bathroom;Assistance with cooking/housework;Assistance with feeding;Direct supervision/assist for medications management;Direct supervision/assist for financial management;Assist for transportation;Help with stairs or ramp for entrance;Supervision due to cognitive status     Functional Status Assessment   Patient has had a recent decline in their functional status and demonstrates the ability to make significant improvements in function in a reasonable and predictable amount of time.     Equipment Recommendations   None recommended by OT      Precautions/Restrictions   Precautions Precautions: Fall Restrictions Weight Bearing Restrictions Per Provider Order: No     Mobility Bed  Mobility Overal bed mobility: Needs Assistance Bed Mobility: Supine to Sit, Sit to Supine     Supine to sit: Supervision Sit to supine: Supervision        Transfers Overall transfer level: Needs assistance Equipment used: 1 person hand held assist Transfers: Sit to/from Stand Sit to Stand: Min assist           General transfer comment: declined ambulation; reports he has walked to/from bathroom acutely      Balance Overall balance assessment: Needs assistance Sitting-balance support: Feet supported Sitting balance-[name] Scale: Fair     Standing balance support: No upper extremity supported, During functional activity Standing balance-[name] Scale: Fair Standing balance comment: statically                           ADL either performed or assessed with clinical judgement   ADL Overall ADL's : Needs assistance/impaired Eating/Feeding: Set up;Sitting   Grooming: Set up;Sitting   Upper Body Bathing: Set up;Sitting   Lower Body Bathing: Moderate assistance;Sit to/from stand   Upper Body Dressing : Set up;Sitting   Lower Body Dressing: Moderate assistance;Sit to/from stand   Toilet Transfer: Minimal assistance;Ambulation Toilet Transfer Details (indicate cue type and reason): short distance Toileting- Clothing Manipulation and Hygiene: Contact Golden assist;Sitting/lateral lean       Functional mobility during ADLs: Minimal assistance General ADL Comments: limited by cognition, weakness, activity tolerance, balance     Vision Baseline Vision/History: 0 No visual deficits Vision Assessment?: No apparent visual deficits     Perception Perception: Not tested       Praxis Praxis: Not tested  Pertinent Vitals/Pain Pain Assessment Pain Assessment: Faces Faces Pain Scale: No hurt     Extremity/Trunk Assessment Upper Extremity Assessment Upper Extremity Assessment: Generalized weakness   Lower Extremity Assessment Lower Extremity  Assessment: Generalized weakness   Cervical / Trunk Assessment Cervical / Trunk Assessment: Kyphotic   Communication Communication Communication: No apparent difficulties   Cognition Arousal: Alert Behavior During Therapy: Agitated Cognition: History of cognitive impairments             OT - Cognition Comments: cog impairment at baseline, pt followed most simple 1 step commands. easily agitated, self-limiting.                 Following commands: Impaired Following commands impaired: Only follows one step commands consistently     Cueing  General Comments      VSS, orthostatic BPs negative    Home Living Family/patient expects to be discharged to:: Private residence Living Arrangements: Spouse/significant other Available Help at Discharge: Family;Available 24 hours/day Type of Home: House Home Access: Ramped entrance     Home Layout: One level     Bathroom Shower/Tub: Chief Strategy Officer: Standard Bathroom Accessibility: Yes How Accessible: Accessible via wheelchair Home Equipment: Rolling Walker (2 wheels);Cane - single point;Transport chair;BSC/3in1;Shower seat   Additional Comments: wife PCG 24/7, PCA present 5 days/wk      Prior Functioning/Environment Prior Level of Function : Needs assist;History of Falls (last six months)             Mobility Comments: amb household distances with cane. Multiple recents falls. Transport chair in community (and sometimes in home if pt having a bad day) ADLs Comments: wife or PCA assists wtih all ADLS, limited by weakness adn activity tolerance. Wife completes all IADLs.    OT Problem List: Decreased strength;Decreased range of motion;Decreased activity tolerance;Impaired balance (sitting and/or standing);Decreased cognition;Decreased safety awareness;Decreased knowledge of use of DME or AE;Decreased knowledge of precautions   OT Treatment/Interventions: Self-care/ADL training;Therapeutic  exercise;DME and/or AE instruction;Cognitive remediation/compensation;Therapeutic activities      OT Goals(Current goals can be found in the care plan section)   Acute Rehab OT Goals Patient Stated Goal: home OT Goal Formulation: With patient Time For Goal Achievement: 08/19/23 Potential to Achieve Goals: Good ADL Goals Pt Will Perform Upper Body Dressing: Independently Pt Will Perform Lower Body Dressing: with supervision;sit to/from stand Pt Will Transfer to Toilet: with supervision;ambulating   OT Frequency:  Min 1X/week       AM-PAC OT "6 Clicks" Daily Activity     Outcome Measure Help from another person eating meals?: A Little Help from another person taking care of personal grooming?: A Little Help from another person toileting, which includes using toliet, bedpan, or urinal?: A Little Help from another person bathing (including washing, rinsing, drying)?: A Little Help from another person to put on and taking off regular upper body clothing?: A Little Help from another person to put on and taking off regular lower body clothing?: A Little 6 Click Score: 18   End of Session Equipment Utilized During Treatment: Gait belt Nurse Communication: Mobility status  Activity Tolerance: Patient limited by fatigue Patient left: in bed;with call bell/phone within reach;with bed alarm set;with family/visitor present  OT Visit Diagnosis: Unsteadiness on feet (R26.81);Other abnormalities of gait and mobility (R26.89);Repeated falls (R29.6);Muscle weakness (generalized) (M62.81);History of falling (Z91.81)                Time: 1610-9604 OT Time Calculation (min): 20 min Charges:  OT  General Charges $OT Visit: 1 Visit OT Evaluation $OT Eval Moderate Complexity: 1 Mod  Derenda Mis, OTR/L Acute Rehabilitation Services Office [phone] Secure Chat Communication Preferred   Donia Pounds 08/05/2023, 1:02 PM

## 2023-08-06 ENCOUNTER — Observation Stay (HOSPITAL_COMMUNITY)

## 2023-08-06 DIAGNOSIS — R569 Unspecified convulsions: Secondary | ICD-10-CM | POA: Diagnosis not present

## 2023-08-06 DIAGNOSIS — G319 Degenerative disease of nervous system, unspecified: Secondary | ICD-10-CM

## 2023-08-06 DIAGNOSIS — I639 Cerebral infarction, unspecified: Secondary | ICD-10-CM | POA: Diagnosis not present

## 2023-08-06 DIAGNOSIS — R55 Syncope and collapse: Secondary | ICD-10-CM

## 2023-08-06 DIAGNOSIS — R42 Dizziness and giddiness: Secondary | ICD-10-CM | POA: Diagnosis not present

## 2023-08-06 DIAGNOSIS — J84112 Idiopathic pulmonary fibrosis: Secondary | ICD-10-CM | POA: Diagnosis not present

## 2023-08-06 DIAGNOSIS — I6389 Other cerebral infarction: Secondary | ICD-10-CM | POA: Diagnosis not present

## 2023-08-06 DIAGNOSIS — I10 Essential (primary) hypertension: Secondary | ICD-10-CM | POA: Diagnosis not present

## 2023-08-06 LAB — ECHOCARDIOGRAM COMPLETE
AR max vel: 2.22 cm2
AV Area VTI: 2.87 cm2
AV Area mean vel: 2.21 cm2
AV Mean grad: 4 mmHg
AV Peak grad: 6.9 mmHg
Ao pk vel: 1.31 m/s
Area-P 1/2: 2.5 cm2
Height: 68 in
MV VTI: 1.75 cm2
S' Lateral: 2.6 cm
Weight: 3552 [oz_av]

## 2023-08-06 LAB — RENAL FUNCTION PANEL
Albumin: 3.5 g/dL (ref 3.5–5.0)
Anion gap: 12 (ref 5–15)
BUN: 11 mg/dL (ref 8–23)
CO2: 21 mmol/L — ABNORMAL LOW (ref 22–32)
Calcium: 9 mg/dL (ref 8.9–10.3)
Chloride: 101 mmol/L (ref 98–111)
Creatinine, Ser: 1.02 mg/dL (ref 0.61–1.24)
GFR, Estimated: >60 mL/min (ref >60)
Glucose, Bld: 120 mg/dL — ABNORMAL HIGH (ref 70–99)
Phosphorus: 3.6 mg/dL (ref 2.5–4.6)
Potassium: 3.8 mmol/L (ref 3.5–5.1)
Sodium: 134 mmol/L — ABNORMAL LOW (ref 135–145)

## 2023-08-06 LAB — LIPID PANEL
Cholesterol: 140 mg/dL (ref 0–200)
HDL: 49 mg/dL (ref >40)
LDL Cholesterol: 67 mg/dL (ref 0–99)
Total CHOL/HDL Ratio: 2.9 ratio
Triglycerides: 120 mg/dL (ref <150)
VLDL: 24 mg/dL (ref 0–40)

## 2023-08-06 LAB — MAGNESIUM: Magnesium: 2 mg/dL (ref 1.7–2.4)

## 2023-08-06 LAB — TSH: TSH: 5.508 u[IU]/mL — ABNORMAL HIGH (ref 0.350–4.500)

## 2023-08-06 LAB — HEMOGLOBIN A1C
Hgb A1c MFr Bld: 5.2 % (ref 4.8–5.6)
Mean Plasma Glucose: 102.54 mg/dL

## 2023-08-06 LAB — T4, FREE: Free T4: 0.87 ng/dL (ref 0.61–1.12)

## 2023-08-06 MED ORDER — IOHEXOL 350 MG/ML SOLN
75.0000 mL | Freq: Once | INTRAVENOUS | Status: AC | PRN
  Administered 2023-08-06: 75 mL via INTRAVENOUS

## 2023-08-06 MED ORDER — PIRFENIDONE 267 MG PO CAPS
3.0000 | ORAL_CAPSULE | Freq: Three times a day (TID) | ORAL | Status: DC
  Administered 2023-08-06 – 2023-08-07 (×4): 3 via ORAL
  Filled 2023-08-06 (×6): qty 3

## 2023-08-06 MED ORDER — STROKE: EARLY STAGES OF RECOVERY BOOK
Freq: Once | Status: AC
  Filled 2023-08-06: qty 1

## 2023-08-06 MED ORDER — CLOPIDOGREL BISULFATE 75 MG PO TABS
75.0000 mg | ORAL_TABLET | Freq: Every day | ORAL | Status: DC
  Administered 2023-08-06 – 2023-08-07 (×2): 75 mg via ORAL
  Filled 2023-08-06 (×2): qty 1

## 2023-08-06 MED ORDER — GUAIFENESIN-DM 100-10 MG/5ML PO SYRP
5.0000 mL | ORAL_SOLUTION | ORAL | Status: DC | PRN
  Administered 2023-08-06: 5 mL via ORAL
  Filled 2023-08-06: qty 5

## 2023-08-06 MED ORDER — IPRATROPIUM-ALBUTEROL 0.5-2.5 (3) MG/3ML IN SOLN: 3.0000 mL | Freq: Four times a day (QID) | RESPIRATORY_TRACT | Status: DC | PRN

## 2023-08-06 MED ORDER — MENTHOL 3 MG MT LOZG
1.0000 | LOZENGE | OROMUCOSAL | Status: DC | PRN
  Filled 2023-08-06: qty 9

## 2023-08-06 NOTE — Progress Notes (Signed)
 PROGRESS NOTE  Luke Golden:[id number] DOB: 1945/07/11   PCP: [facility]  Patient is from: Home.  DOA: 08/04/2023 LOS: 0  Chief complaints Chief Complaint  Patient presents with   Weakness     Brief Narrative / Interim history: 78 year old M with PMH of cognitive impairment, pulmonary fibrosis, HTN, OSA not on CPAP and mood disorder presented to ED with frequent shakes and tremors usually with exertion, and admitted with working diagnosis of seizure-like activity.  Reportedly had a normal CT head 2 days prior at the [place].  On further interview, patient reports dizzy spells when he tries to stand for the last few months.  He reports about 5 spells in the last 2 months.  Reportedly takes about 200 to 400 mg Viagra as needed although wife reports that he has not taken this medication in a while.  In ED, normal vitals.  CBC, CMP, UA, ammonia and twelve-lead EKG without significant finding.  Neurology consulted.  EEG and MRI brain ordered.  MRI brain showed a couple of punctate acute infarct in the high left frontoparietal perirolandic cortex, bilateral remote PCA territory infarcts and cerebral atrophy.  Started on Plavix in addition to low-dose aspirin.  CVA workup underway.  Subjective: Seen and examined earlier this morning.  No major events overnight of this morning.  Patient has no complaints.  He feels he is ready to go home.  He denies dizziness, focal weakness or numbness.  He was adamant about going to bathroom.  After he got into the bathroom, he lost his balance and almost fell backward but I was able to break the fall.  He was able to sit on the commode afterward.   Objective: Vitals:   08/06/23 0028 08/06/23 0337 08/06/23 0743 08/06/23 1228  BP: (!) 151/67 139/67 (!) 141/60 128/61  Pulse: 62 67 61 64  Resp: 16 16 18 18   Temp: 97.6 F (36.4 C) 98.5 F (36.9 C) (!) 97.5 F (36.4 C) (!) 97.5 F (36.4 C)  TempSrc: Oral Oral Oral Oral  SpO2: 96% 93% 95% 95%   Weight:      Height:        Examination:  GENERAL: No apparent distress.  Nontoxic. HEENT: MMM.  Vision and hearing grossly intact.  NECK: Supple.  No apparent JVD.  RESP:  No IWOB.  Fair aeration bilaterally. CVS:  RRR. Heart sounds normal.  ABD/GI/GU: BS+. Abd soft, NTND.  MSK/EXT:  Moves extremities. No apparent deformity. No edema.  SKIN: no apparent skin lesion or wound NEURO: Awake, alert and oriented x 4.  No apparent focal neuro deficit. PSYCH: Calm. Normal affect.   Procedures:  3/6-normal EEG without seizure or epileptiform discharge.  Microbiology summarized: MRSA PCR screen nonreactive.  Assessment and plan: Dizzy spells/tremors/shakes: Episodic.  Patient reports dizzy spells when he stands up.  Reports about 5 episodes in the last 2 months.  MRI brain without acute and chronic stroke, and brain atrophy but think this would explain his symptoms. Reportedly takes 200 to 400 mg of Viagra which could contribute.  Orthostatic vitals negative.  TTE in 05/2023 without significant finding.  Basic labs, UA and EEG unrevealing.  Has history of OSA.  Not compliant with CPAP.   Denies smoking cigarette, drinking alcohol recreational drug use. -Appreciate neuro recommendations -Referral to [facility] neurology movement disorder clinic to assess for possible LBD or early PD with dementia -Autonomic testing outpatient -Encouraged spouse to take video of future spells if possible. -Fall precaution, PT/OT  Acute  CVA: MRI brain showed a couple of punctate acute infarct in the high left frontoparietal perirolandic cortex, bilateral remote PCA territory infarcts and cerebral atrophy.  I do not think this would explain his symptoms.  I think this is incidental finding.  TTE as above.  LDL 64.  A1c 5.2%. -On Plavix and aspirin per neurology -Continue CT angio head and neck -PT/OT eval  Essential hypertension: BP slightly elevated this morning. -Continue home amlodipine 10 mg  daily  Cognitive impairment: He is on Aricept and Namenda outpatient.  Currently awake and alert and oriented x 4.  MRI brain with cerebral atrophy in keeping with dementia. -Continue home Aricept and Namenda  Mood disorder: Stable -Continue home Depakote -Continue home Prozac.  OSA not compliant with CPAP: Normal VBG.  Hyperlipidemia -Continue Crestor  GERD Continue Pepcid  Euthyroid sick syndrome: TSH slightly elevated but normal free T4. -Recheck in 4 to 6 weeks  Obesity Body mass index is 33.75 kg/m. -Encourage lifestyle change to lose weight.         DVT prophylaxis:  SCDs Start: 08/05/23 0644  Code Status: DNR/DNI Family Communication: None at bedside Level of care: Telemetry Medical Status is: Observation The patient will require care spanning > 2 midnights and should be moved to inpatient because: Acute CVA, dizzy spells   Final disposition: Likely home once medically cleared. Consultants:  Neurology  55 minutes with more than 50% spent in reviewing records, counseling patient/family and coordinating care.   Sch Meds:  Scheduled Meds:  amLODipine  10 mg Oral Daily   aspirin EC  81 mg Oral Daily   clopidogrel  75 mg Oral Daily   divalproex  750 mg Oral QHS   donepezil  10 mg Oral QHS   famotidine  20 mg Oral QHS   FLUoxetine  30 mg Oral Daily   memantine  20 mg Oral QHS   pantoprazole  40 mg Oral Daily   Pirfenidone  3 capsule Oral TID WC   rosuvastatin  10 mg Oral Daily   Continuous Infusions: PRN Meds:.  Antimicrobials: Anti-infectives (From admission, onward)    None        I have personally reviewed the following labs and images: CBC: Recent Labs  Lab 08/03/23 1749 08/04/23 1525 08/05/23 0654  WBC 8.2 6.3 6.8  NEUTROABS  --   --  2.9  HGB 14.7 14.6 13.3  HCT 42.6 43.2 38.4*  MCV 96.8 96.4 95.3  PLT 183 177 164   BMP &GFR Recent Labs  Lab 08/03/23 1749 08/04/23 1525 08/05/23 0654 08/06/23 0921 08/06/23 0923  NA  131* 137 135  --  134*  K 4.4 4.1 4.1  --  3.8  CL 99 102 107  --  101  CO2 24 28 22   --  21*  GLUCOSE 118* 97 78  --  120*  BUN 22 20 19   --  11  CREATININE 1.22 1.09 0.97  --  1.02  CALCIUM 9.2 9.9 8.3*  --  9.0  MG  --   --   --  2.0  --   PHOS  --   --   --   --  3.6   Estimated Creatinine Clearance: 68.6 mL/min (by C-G formula based on SCr of 1.02 mg/dL). Liver & Pancreas: Recent Labs  Lab 08/05/23 0654 08/06/23 0923  AST 19  --   ALT 16  --   ALKPHOS 76  --   BILITOT 0.5  --   PROT 6.1*  --  ALBUMIN 3.4* 3.5   No results for input(s): "LIPASE", "AMYLASE" in the last 168 hours. Recent Labs  Lab 08/04/23 1537  AMMONIA 29   Diabetic: Recent Labs    08/06/23 0923  HGBA1C 5.2   Recent Labs  Lab 08/03/23 1737  GLUCAP 106*   Cardiac Enzymes: No results for input(s): "CKTOTAL", "CKMB", "CKMBINDEX", "TROPONINI" in the last 168 hours. Recent Labs    03/25/23 1057  PROBNP 81.0   Coagulation Profile: No results for input(s): "INR", "PROTIME" in the last 168 hours. Thyroid Function Tests: Recent Labs    08/06/23 0921  TSH 5.508*  FREET4 0.87   Lipid Profile: Recent Labs    08/06/23 0923  CHOL 140  HDL 49  LDLCALC 67  TRIG 120  CHOLHDL 2.9   Anemia Panel: Recent Labs    08/05/23 1350  VITAMINB12 1,005*   Urine analysis:    Component Value Date/Time   COLORURINE YELLOW 08/04/2023 2108   APPEARANCEUR CLEAR 08/04/2023 2108   LABSPEC 1.019 08/04/2023 2108   PHURINE 6.5 08/04/2023 2108   GLUCOSEU NEGATIVE 08/04/2023 2108   HGBUR TRACE (A) 08/04/2023 2108   BILIRUBINUR NEGATIVE 08/04/2023 2108   KETONESUR NEGATIVE 08/04/2023 2108   PROTEINUR NEGATIVE 08/04/2023 2108   UROBILINOGEN 0.2 07/05/2013 1342   NITRITE NEGATIVE 08/04/2023 2108   LEUKOCYTESUR NEGATIVE 08/04/2023 2108   Sepsis Labs: Invalid input(s): "PROCALCITONIN", "LACTICIDVEN"  Microbiology: Recent Results (from the past 240 hours)  MRSA Next Gen by PCR, Nasal     Status: None    Collection Time: 08/05/23  7:22 AM   Specimen: Nasal Mucosa; Nasal Swab  Result Value Ref Range Status   MRSA by PCR Next Gen NOT DETECTED NOT DETECTED Final    Comment: (NOTE) The GeneXpert MRSA Assay (FDA approved for NASAL specimens only), is one component of a comprehensive MRSA colonization surveillance program. It is not intended to diagnose MRSA infection nor to guide or monitor treatment for MRSA infections. Test performance is not FDA approved in patients less than 26 years old. Performed at [facility], [address] [place], [place] [address]     Radiology Studies: MR BRAIN W WO CONTRAST Result Date: 08/05/2023 CLINICAL DATA:  Parkinsonian syndrome EXAM: MRI HEAD WITHOUT AND WITH CONTRAST TECHNIQUE: Multiplanar, multiecho pulse sequences of the brain and surrounding structures were obtained without and with intravenous contrast. CONTRAST:  10mL GADAVIST GADOBUTROL 1 MMOL/ML IV SOLN COMPARISON:  CT head 12/06/2004 FINDINGS: Brain: A couple punctate acute infarcts in the high left frontoparietal perirolandic cortex. No significant edema or mass effect. No midline shift. No hydrocephalus. Bilateral remote PCA territory infarcts. No pathologic enhancement. Cerebral atrophy. Vascular: Major arterial flow voids are maintained. Skull and upper cervical spine: Normal marrow signal. Sinuses/Orbits: Clear sinuses.  No acute orbital findings. Other: No mastoid effusions. IMPRESSION: 1. 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 2. Bilateral remote PCA territory infarcts. 3.  Cerebral Atrophy (ICD10-G31.9). Electronically Signed   By: Feliberto Harts M.D.   On: 08/05/2023 20:11      Kaelene Elliston T. Rocio Roam [facility]  If 7PM-7AM, please contact night-coverage [email or web address] 08/06/2023, 1:59 PM

## 2023-08-06 NOTE — Progress Notes (Addendum)
 NEUROLOGY CONSULT FOLLOW UP NOTE   Date of service: August 06, 2023 Patient Name: Luke Golden MRN:  [id number] DOB:  1945-07-19  Interval Hx/subjective   On interview, it appears patient has had multiple episodes of falling while exiting very warm showers.  Patient describes this as "lightheadedness".  Fell and did not hit head.  Per wife, home aide was found holding patient up.  Blood pressure at time of fall was notably lower, SBP <100, but wife is unable to remember the exact figure.  Discussed newfound punctate strokes, likely result of prolonged hypotension.  Wife concerned the patient had incontinence of urine overnight, which is atypical.  Patient amenable to interview, no new complaints.  Expressed desire to leave after cardioembolic workup is finished, today versus tomorrow.  Vitals   Vitals:   08/05/23 1154 08/06/23 0028 08/06/23 0337 08/06/23 0743  BP: (!) 133/49 (!) 151/67 139/67 (!) 141/60  Pulse:  62 67 61  Resp: 17 16 16 18   Temp: 97.7 F (36.5 C) 97.6 F (36.4 C) 98.5 F (36.9 C) (!) 97.5 F (36.4 C)  TempSrc: Oral Oral Oral Oral  SpO2: 92% 96% 93% 95%  Weight:      Height:         Body mass index is 33.75 kg/m.  Physical Exam   Constitutional: Appears well-developed and well-nourished.  Psych: Affect appropriate to situation.  Respiratory: Effort normal, non-labored breathing.    Neurologic Examination    Mental Status: Alert and oriented. Pleasant and cooperative with good eye contact. Thought content appropriate.  Speech fluent without evidence of aphasia.  Able to follow all commands without difficulty. Cranial Nerves: II: Temporal visual fields intact with no extinction to DSS III,IV, VI: No ptosis. EOMI but with saccadic visual pursuits noted. Fixed pupils, L greater in diameter than right. Patient previously underwent bilateral cataract surgery.  V: Light touch sensation equal bilaterally  VII: Smile symmetric.  VIII: Hearing intact to voice.  Hearing aids at baseline, none present.  IX,X: No hoarseness or hypophonia XI: Symmetric shoulder shrug XII: Midline tongue extension Motor: BUE 5/5 proximally and distally No pronator drift No rest tremor noted.  No asterixis Mild/subtle low-amplitude action tremor intermittently noted to BUE.  BLE 5/5 proximally and distally  Sensory: Temp and light touch intact throughout, bilaterally. No extinction to DSS.  Cerebellar: No ataxia with FNF bilaterally, but with subtle action tremor noted Gait: Deferred  Medications  Current Facility-Administered Medications:    amLODipine (NORVASC) tablet 10 mg, 10 mg, Oral, Daily, Eduard Clos, MD, 10 mg at 08/06/23 6578   aspirin EC tablet 81 mg, 81 mg, Oral, Daily, Eduard Clos, MD, 81 mg at 08/06/23 4696   divalproex (DEPAKOTE) DR tablet 750 mg, 750 mg, Oral, QHS, Eduard Clos, MD, 750 mg at 08/05/23 2132   donepezil (ARICEPT) tablet 10 mg, 10 mg, Oral, QHS, Eduard Clos, MD, 10 mg at 08/05/23 2132   famotidine (PEPCID) tablet 20 mg, 20 mg, Oral, QHS, Gonfa, Taye T, MD, 20 mg at 08/05/23 2132   FLUoxetine (PROZAC) capsule 30 mg, 30 mg, Oral, Daily, Eduard Clos, MD, 30 mg at 08/06/23 0828   memantine (NAMENDA) tablet 20 mg, 20 mg, Oral, QHS, Eduard Clos, MD, 20 mg at 08/05/23 2132   pantoprazole (PROTONIX) EC tablet 40 mg, 40 mg, Oral, Daily, Eduard Clos, MD, 40 mg at 08/06/23 2952   Pirfenidone CAPS 3 capsule, 3 capsule, Oral, TID WC, Gonfa, Boyce Medici, MD, 3  capsule at 08/06/23 0828   rosuvastatin (CRESTOR) tablet 10 mg, 10 mg, Oral, Daily, Eduard Clos, MD, 10 mg at 08/06/23 0828  Labs and Diagnostic Imaging   CBC:  Recent Labs  Lab 08/04/23 1525 08/05/23 0654  WBC 6.3 6.8  NEUTROABS  --  2.9  HGB 14.6 13.3  HCT 43.2 38.4*  MCV 96.4 95.3  PLT 177 164    Basic Metabolic Panel:  Lab Results  Component Value Date   NA 135 08/05/2023   K 4.1 08/05/2023   CO2 22  08/05/2023   GLUCOSE 78 08/05/2023   BUN 19 08/05/2023   CREATININE 0.97 08/05/2023   CALCIUM 8.3 (L) 08/05/2023   GFRNONAA >60 08/05/2023   GFRAA >60 01/16/2018   Lipid Panel: No results found for: "LDLCALC" HgbA1c:  Lab Results  Component Value Date   HGBA1C 5.3 01/27/2016   Urine Drug Screen: No results found for: "LABOPIA", "COCAINSCRNUR", "LABBENZ", "AMPHETMU", "THCU", "LABBARB"  Alcohol Level No results found for: "ETH" INR  Lab Results  Component Value Date   INR 0.96 07/05/2013   APTT  Lab Results  Component Value Date   APTT 34 07/05/2013   AED levels: No results found for: "PHENYTOIN", "ZONISAMIDE", "LAMOTRIGINE", "LEVETIRACETA"  MRI Brain:  1. 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 2. Bilateral remote PCA territory infarcts. 3.  Cerebral Atrophy (ICD10-G31.9).  rEEG:  This study is within normal limits. No seizures or epileptiform discharges were seen throughout the recording.   Assessment   [name] is a 78 y.o. male presenting to the hospital for evaluation after a spell at home with semiological features most suggestive of syncope, but also with components suggestive of possible epileptic seizures. He has had multiple such episodes over the past 1-2 years, occurring an average of approximately once per month. See HPI for further description of the semiology.   3/6:  - Exam reveals mild cogwheel rigidity of the BUE and mild action tremor. No asterixis, myoclonus, weakness or sensory loss noted.  - Labs are essentially unremarkable - DDx for presentation includes syncope with convulsion, abrupt cognitive fluctuations in the setting of possible Lewy body dementia or early Parkinson's disease and atypical presentation of epileptic seizures. 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   3/7: MRI revealed two punctate acute left frontoparietal infarcts and generalized cerebral atrophy.  While this is likely secondary to prolonged hypotension following vasovagal presyncope, we will order further cardioembolic workup including CTA head and neck and repeat echocardiogram.  EEG unremarkable. Orthostatics negative @1200  3/6. PT/OT recommend home health PT/OT.   Recommendations  - Start Plavix 75 mg daily - Continue Aspirin 81 mg daily - Ordered CTA head and neck/echocardiogram, awaiting results - Advised patient to cool shower temperature before exiting bathroom to prevent further falls. - Neurology will continue to follow.  Signed, Tomie China, MD [facility], PGY-1  I have seen the patient and reviewed the above note.  His episodes happen consistently as he is getting out of a hot shower and are fairly stereotyped but always are associated with severe lightheadedness, and then dysfunction/tremor of the right side.  Based on the description of the characteristic timing, my suspicion is that he is dropping his blood pressures with the vasodilatation of the  shower and having presyncope.  He is being held upright, which can prolong the symptoms of presyncope.  Advised his wife to try laying him down and seeing if he improves more quickly.  I am hesitant to start antiepileptics at this time, but if he continues having spells, or does not improve more quickly as he is laid down, then may need to consider changing his Depakote from 750 nightly to 750 twice daily.  Ritta Slot, MD [facility]   If 7pm- 7am, please page neurology on call as listed in [facility]ION.

## 2023-08-07 ENCOUNTER — Other Ambulatory Visit (HOSPITAL_COMMUNITY): Payer: Self-pay

## 2023-08-07 DIAGNOSIS — G4733 Obstructive sleep apnea (adult) (pediatric): Secondary | ICD-10-CM | POA: Diagnosis not present

## 2023-08-07 DIAGNOSIS — I10 Essential (primary) hypertension: Secondary | ICD-10-CM | POA: Diagnosis not present

## 2023-08-07 DIAGNOSIS — I639 Cerebral infarction, unspecified: Secondary | ICD-10-CM | POA: Diagnosis not present

## 2023-08-07 DIAGNOSIS — R569 Unspecified convulsions: Secondary | ICD-10-CM | POA: Diagnosis not present

## 2023-08-07 MED ORDER — CLOPIDOGREL BISULFATE 75 MG PO TABS
75.0000 mg | ORAL_TABLET | Freq: Every day | ORAL | 0 refills | Status: AC
Start: 2023-08-08 — End: 2023-08-28
  Filled 2023-08-07: qty 20, 20d supply, fill #0

## 2023-08-07 NOTE — Discharge Summary (Signed)
 Physician Discharge Summary  Luke Golden WUJ:[id number] DOB: [birth year] DOA: 08/04/2023  PCP: [facility]  Admit date: 08/04/2023 Discharge date: 08/07/23  Admitted From: Home Disposition: Home Recommendations for Outpatient Follow-up:  Outpatient follow-up with PCP in 1 to 2 weeks Recommend referral to neurology for possible movement disorder Check CMP and CBC at follow-up Please follow up on the following pending results: None  Home Health: [facility] PT/OT Equipment/Devices: Hospital bed or wheelchair  Discharge Condition: Stable CODE STATUS: DNR/DNI  Follow-up Information     [facility] Follow up.   Specialty: Home Health Services Why: [facility] will contact you for the first home visit Contact information: [address] [place] [address] [phone]         [facility] Schedule an appointment as soon as possible for a visit in 1 week(s).   Contact information: [address] [facility] [address] [place] [address] [phone]                 Hospital course 78 year old M with PMH of cognitive impairment, pulmonary fibrosis, HTN, OSA not on CPAP and mood disorder presented to ED with frequent shakes and tremors usually with exertion, and admitted with working diagnosis of seizure-like activity.  Reportedly had a normal CT head 2 days prior at the [place].  On further interview, patient reports dizzy spells when he tries to stand for the last few months.  He reports about 5 spells in the last 2 months.  Reportedly takes about 200 to 400 mg Viagra as needed although wife reports that he has not taken this medication in a while.   In ED, normal vitals.  CBC, CMP, UA, ammonia and twelve-lead EKG without significant finding.  Neurology consulted.  EEG and MRI brain ordered.   MRI brain showed a couple of punctate acute infarct in the high left frontoparietal perirolandic cortex, bilateral remote PCA territory infarcts and  cerebral atrophy.  Started on Plavix in addition to low-dose aspirin.  CT angio head and neck and TTE without significant finding (see details below).  A1c 5.2%.  LDL 67.  On the day of discharge, patient felt better and ready to go home.  Cleared for discharge by neurology on Plavix and aspirin for 3 weeks followed by aspirin alone.  Already on statin.  Discontinued Viagra and counseled on the risk.  Also advised to avoid energy drinks.  Home health PT/OT as well as hospital bed and wheelchair ordered.  Outpatient follow-up with neurology for possible movement disorder.   See individual problem list below for more.   Problems addressed during this hospitalization Dizzy spells/tremors/shakes: Episodic.  Patient reports dizzy spells when he stands up.  Reports about 5 episodes in the last 2 months.  MRI brain without acute and chronic stroke, and brain atrophy but think this would explain his symptoms. Reportedly takes 200 to 400 mg of Viagra which could contribute.  Orthostatic vitals negative.  CT angio head and neck and TTE without significant finding.  Basic labs, UA and EEG unrevealing.  Has history of OSA.  Not compliant with CPAP.   Denies smoking cigarette, drinking alcohol recreational drug use. -Recommend referral to neurology outpatient -Stopped Viagra.    Acute CVA: MRI brain showed a couple of punctate acute infarct in the high left frontoparietal perirolandic cortex, bilateral remote PCA territory infarcts and cerebral atrophy.  I do not think this would explain his symptoms.  I think this is incidental finding.  CT angio head  and neck without acute finding.  TTE as above.  LDL 64.  A1c 5.2%. -Neurology recommended Plavix and aspirin for 3 weeks followed by aspirin alone (discussed with Dr. Amada Jupiter over the phone) -Continue Crestor  Essential hypertension: BP slightly elevated this morning. -Continue home amlodipine   Cognitive impairment: He is on Aricept and Namenda outpatient.   Currently awake and alert and oriented x 4.  MRI brain with cerebral atrophy in keeping with dementia. -Continue home Aricept and Namenda   Mood disorder: Stable -Continue home Depakote and Prozac.   OSA not compliant with CPAP: Normal VBG.   Hyperlipidemia -Continue Crestor   GERD Continue Pepcid   Euthyroid sick syndrome: TSH slightly elevated but normal free T4. -Recheck in 4 to 6 weeks            Time spent 35 minutes  Vital signs Vitals:   08/06/23 2045 08/06/23 2325 08/07/23 0420 08/07/23 0729  BP: 114/60 (!) 129/53 133/61 (!) 141/59  Pulse: 64 62 62 (!) 57  Temp: 97.7 F (36.5 C) 97.7 F (36.5 C) 97.7 F (36.5 C) 97.9 F (36.6 C)  Resp: 18 16 19 17   Height:      Weight:      SpO2: 93% 95% 94% 93%  TempSrc: Oral Oral Oral Oral  BMI (Calculated):         Discharge exam  GENERAL: No apparent distress.  Nontoxic. HEENT: MMM.  Vision and hearing grossly intact.  NECK: Supple.  No apparent JVD.  RESP:  No IWOB.  Fair aeration bilaterally. CVS:  RRR. Heart sounds normal.  ABD/GI/GU: BS+. Abd soft, NTND.  MSK/EXT:  Moves extremities. No apparent deformity. No edema.  SKIN: no apparent skin lesion or wound NEURO: Awake and alert. Oriented appropriately.  No apparent focal neuro deficit. PSYCH: Calm. Normal affect.   Discharge Instructions Discharge Instructions     Diet - low sodium heart healthy   Complete by: As directed    Discharge instructions   Complete by: As directed    It has been a pleasure taking care of you!  You were hospitalized due to dizzy spells and a stroke.  Unclear what caused your dizzy spells but Viagra could contribute.  We have stopped Viagra/sildenafil.  We also recommend avoiding energy drinks and over-the-counter supplements other than multivitamin.  We have started you on Plavix and aspirin to prevent further stroke.  Taking medications as prescribed.  Please review your new medication list and the directions on your  medications before you take them.  Follow-up with your primary care doctor in 1 to 2 weeks or sooner if needed.  We strongly recommend stepwise approach when you get up to walk to prevent falls.   Take care,   Increase activity slowly   Complete by: As directed       Allergies as of 08/07/2023       Reactions   Lisinopril Cough   Cephalosporins Hives   Tylenol [acetaminophen] Other (See Comments)   Feel weird         Medication List     PAUSE taking these medications    omeprazole 40 MG capsule Wait to take this until: August 28, 2023 Commonly known as: PRILOSEC Take 1 capsule (40 mg total) by mouth daily. To be taken 30 minutes before breakfast       STOP taking these medications    sildenafil 100 MG tablet Commonly known as: VIAGRA       TAKE these medications  albuterol 108 (90 Base) MCG/ACT inhaler Commonly known as: VENTOLIN HFA Inhale 2 puffs into the lungs every 6 (six) hours as needed for wheezing or shortness of breath.   amLODipine 5 MG tablet Commonly known as: NORVASC Take 5 mg by mouth daily.   ammonium lactate 12 % lotion Commonly known as: LAC-HYDRIN Apply 1 Application topically 2 (two) times daily as needed for dry skin.   aspirin EC 81 MG tablet Take 81 mg by mouth daily.   benzoyl peroxide 5 % external liquid Apply 1 Application topically at bedtime.   carboxymethylcellulose 0.5 % Soln Commonly known as: REFRESH PLUS Place 1 drop into both eyes 4 (four) times daily as needed.   clopidogrel 75 MG tablet Commonly known as: PLAVIX Take 1 tablet (75 mg total) by mouth daily for 20 days. Start taking on: August 08, 2023   diclofenac Sodium 1 % Gel Commonly known as: VOLTAREN Apply 2 g topically 4 (four) times daily as needed (joint pain).   divalproex 250 MG DR tablet Commonly known as: DEPAKOTE Take 750 mg by mouth at bedtime.   donepezil 10 MG tablet Commonly known as: ARICEPT Take 10 mg by mouth at bedtime.   DSS 100 MG  Caps Take 100 mg by mouth 2 (two) times daily as needed (constipation).   famotidine 20 MG tablet Commonly known as: PEPCID Take 1 tablet (20 mg total) by mouth at bedtime.   FLUoxetine 10 MG capsule Commonly known as: PROZAC Take 30 mg by mouth at bedtime.   memantine 10 MG tablet Commonly known as: NAMENDA TAKE ONE TABLET BY MOUTH AT BEDTIME TO SLOW MEMORY LOSS   Pirfenidone 267 MG Tabs Commonly known as: Esbriet Take 3 tablets (801 mg total) by mouth 3 (three) times daily with meals.   rosuvastatin 40 MG tablet Commonly known as: CRESTOR Take 20 mg by mouth daily.               Durable Medical Equipment  (From admission, onward)           Start     Ordered   08/05/23 1432  For home use only DME Hospital bed  Once       Question Answer Comment  Length of Need 6 Months   The above medical condition requires: Patient requires the ability to reposition frequently   Head must be elevated greater than: 30 degrees   Bed type Semi-electric      08/05/23 1431   08/05/23 1432  For home use only DME lightweight manual wheelchair with seat cushion  Once       Comments: Patient suffers from weakness which impairs their ability to perform daily activities like bathing, dressing, and grooming in the home.  A walker will not resolve  issue with performing activities of daily living. A wheelchair will allow patient to safely perform daily activities. Patient is not able to propel themselves in the home using a standard weight wheelchair due to general weakness. Patient can self propel in the lightweight wheelchair. Length of need Lifetime. Accessories: elevating leg rests (ELRs), wheel locks, extensions and anti-tippers.   08/05/23 1431            Consultations: Neurology  Procedures/Studies:   CT ANGIO HEAD NECK W WO CM Result Date: 08/06/2023 CLINICAL DATA:  Stroke, follow up EXAM: CT ANGIOGRAPHY HEAD AND NECK WITH AND WITHOUT CONTRAST TECHNIQUE: Multidetector CT  imaging of the head and neck was performed using the standard protocol during bolus administration  of intravenous contrast. Multiplanar CT image reconstructions and MIPs were obtained to evaluate the vascular anatomy. Carotid stenosis measurements (when applicable) are obtained utilizing NASCET criteria, using the distal internal carotid diameter as the denominator. RADIATION DOSE REDUCTION: This exam was performed according to the departmental dose-optimization program which includes automated exposure control, adjustment of the mA and/or kV according to patient size and/or use of iterative reconstruction technique. CONTRAST:  75mL OMNIPAQUE IOHEXOL 350 MG/ML SOLN COMPARISON:  CT head 12/06/2004.  MRI head August 05, 2023. FINDINGS: CT HEAD FINDINGS Brain: Remote bilateral PCA territory and remote right parietal infarct. Remote right cerebellar infarcts. Remote right caudate infarct. Additional patchy white matter hypodensities are nonspecific but compatible with chronic microvascular ischemic change.No evidence of acute hemorrhage, mass lesion or midline shift. Vascular: See below. Skull: No acute fracture. Sinuses/Orbits: Mostly clear sinuses.  No acute orbital findings. Other: No mastoid effusions. Review of the MIP images confirms the above findings CTA NECK FINDINGS Aortic arch: Aortic atherosclerosis. Right carotid system: Atherosclerosis at the carotid bifurcation with approximately 60% stenosis. Left carotid system: Atherosclerosis at the carotid bifurcation without greater than 50% stenosis. Vertebral arteries: Patent bilaterally. Moderate right vertebral artery origin stenosis due to atherosclerosis. Skeleton: No evidence of acute abnormality on limited assessment. Other neck: No evidence of acute abnormality. Upper chest: Emphysema.  Visualized lung apices are clear. Review of the MIP images confirms the above findings CTA HEAD FINDINGS Anterior circulation: Motion limited assessment. Bilateral  intracranial ICAs, MCAs, and ACAs appear patent without visible proximal high-grade stenosis. Posterior circulation: Bilateral intradural vertebral arteries, basilar artery and bilateral posterior cerebral arteries are patent without proximal hemodynamically significant stenosis. Venous sinuses: As permitted by contrast timing, patent. Review of the MIP images confirms the above findings IMPRESSION: 1. No evidence of acute intracranial abnormality. 2. Motion limited study without large vessel occlusion or proximal high-grade stenosis. Electronically Signed   By: Feliberto Harts M.D.   On: 08/06/2023 19:36   ECHOCARDIOGRAM COMPLETE Result Date: 08/06/2023    ECHOCARDIOGRAM REPORT   Patient Name:   KODEE RAVERT Date of Exam: 08/06/2023 Medical Rec #:  [id number]      Height:       68.0 in Accession #:    [id number]     Weight:       222.0 lb Date of Birth:  Oct 12, 1945      BSA:          2.136 m Patient Age:    78 years       BP:           128/61 mmHg Patient Gender: M              HR:           68 bpm. Exam Location:  Inpatient Procedure: 2D Echo, Cardiac Doppler and Color Doppler (Both Spectral and Color            Flow Doppler were utilized during procedure). Indications:    Stroke I63.9  History:        Patient has prior history of Echocardiogram examinations, most                 recent 05/31/2023. Stroke; Risk Factors:Hypertension.  Sonographer:    Darlys Gales Referring Phys: [id number] Rickia Freeburg T Mukhtar Shams IMPRESSIONS  1. Left ventricular ejection fraction, by estimation, is 60 to 65%. The left ventricle has normal function. The left ventricle has no regional wall motion abnormalities. Left ventricular diastolic parameters are indeterminate.  2.  Right ventricular systolic function is normal. The right ventricular size is normal.  3. The mitral valve is degenerative. No evidence of mitral valve regurgitation. No evidence of mitral stenosis. Severe mitral annular calcification.  4. The aortic valve is tricuspid. There is  moderate calcification of the aortic valve. There is moderate thickening of the aortic valve. Aortic valve regurgitation is not visualized. Aortic valve sclerosis is present, with no evidence of aortic valve stenosis.  5. The inferior vena cava is normal in size with greater than 50% respiratory variability, suggesting right atrial pressure of 3 mmHg. FINDINGS  Left Ventricle: Left ventricular ejection fraction, by estimation, is 60 to 65%. The left ventricle has normal function. The left ventricle has no regional wall motion abnormalities. Strain was performed and the global longitudinal strain is indeterminate. The left ventricular internal cavity size was normal in size. There is no left ventricular hypertrophy. Left ventricular diastolic parameters are indeterminate. Right Ventricle: The right ventricular size is normal. No increase in right ventricular wall thickness. Right ventricular systolic function is normal. Left Atrium: Left atrial size was normal in size. Right Atrium: Right atrial size was normal in size. Pericardium: There is no evidence of pericardial effusion. Mitral Valve: The mitral valve is degenerative in appearance. There is mild thickening of the mitral valve leaflet(s). There is mild calcification of the mitral valve leaflet(s). Severe mitral annular calcification. No evidence of mitral valve regurgitation. No evidence of mitral valve stenosis. MV peak gradient, 7.4 mmHg. The mean mitral valve gradient is 3.0 mmHg. Tricuspid Valve: The tricuspid valve is normal in structure. Tricuspid valve regurgitation is not demonstrated. No evidence of tricuspid stenosis. Aortic Valve: The aortic valve is tricuspid. There is moderate calcification of the aortic valve. There is moderate thickening of the aortic valve. Aortic valve regurgitation is not visualized. Aortic valve sclerosis is present, with no evidence of aortic valve stenosis. Aortic valve mean gradient measures 4.0 mmHg. Aortic valve peak  gradient measures 6.9 mmHg. Aortic valve area, by VTI measures 2.87 cm. Pulmonic Valve: The pulmonic valve was normal in structure. Pulmonic valve regurgitation is not visualized. No evidence of pulmonic stenosis. Aorta: The aortic root is normal in size and structure. Venous: The inferior vena cava is normal in size with greater than 50% respiratory variability, suggesting right atrial pressure of 3 mmHg. IAS/Shunts: No atrial level shunt detected by color flow Doppler. Additional Comments: 3D was performed not requiring image post processing on an independent workstation and was indeterminate.  LEFT VENTRICLE PLAX 2D LVIDd:         4.80 cm   Diastology LVIDs:         2.60 cm   LV e' medial:    8.38 cm/s LV PW:         1.10 cm   LV E/e' medial:  12.1 LV IVS:        0.70 cm   LV e' lateral:   7.29 cm/s LVOT diam:     2.00 cm   LV E/e' lateral: 13.9 LV SV:         75 LV SV Index:   35 LVOT Area:     3.14 cm  RIGHT VENTRICLE RV S prime:     14.50 cm/s TAPSE (M-mode): 2.8 cm LEFT ATRIUM             Index        RIGHT ATRIUM           Index LA Vol (A2C):  66.9 ml 31.31 ml/m  RA Area:     17.40 cm LA Vol (A4C):   37.4 ml 17.51 ml/m  RA Volume:   48.50 ml  22.70 ml/m LA Biplane Vol: 52.4 ml 24.53 ml/m  AORTIC VALVE AV Area (Vmax):    2.22 cm AV Area (Vmean):   2.21 cm AV Area (VTI):     2.87 cm AV Vmax:           131.00 cm/s AV Vmean:          95.700 cm/s AV VTI:            0.262 m AV Peak Grad:      6.9 mmHg AV Mean Grad:      4.0 mmHg LVOT Vmax:         92.60 cm/s LVOT Vmean:        67.300 cm/s LVOT VTI:          0.239 m LVOT/AV VTI ratio: 0.91  AORTA Ao Root diam: 3.30 cm Ao Asc diam:  3.50 cm MITRAL VALVE MV Area (PHT): 2.50 cm     SHUNTS MV Area VTI:   1.75 cm     Systemic VTI:  0.24 m MV Peak grad:  7.4 mmHg     Systemic Diam: 2.00 cm MV Mean grad:  3.0 mmHg MV Vmax:       1.36 m/s MV Vmean:      89.1 cm/s MV Decel Time: 304 msec MV E velocity: 101.00 cm/s MV A velocity: 134.00 cm/s MV E/A ratio:  0.75  Charlton Haws MD Electronically signed by Charlton Haws MD Signature Date/Time: 08/06/2023/3:40:31 PM    Final    MR BRAIN W WO CONTRAST Result Date: 08/05/2023 CLINICAL DATA:  Parkinsonian syndrome EXAM: MRI HEAD WITHOUT AND WITH CONTRAST TECHNIQUE: Multiplanar, multiecho pulse sequences of the brain and surrounding structures were obtained without and with intravenous contrast. CONTRAST:  10mL GADAVIST GADOBUTROL 1 MMOL/ML IV SOLN COMPARISON:  CT head 12/06/2004 FINDINGS: Brain: A couple punctate acute infarcts in the high left frontoparietal perirolandic cortex. No significant edema or mass effect. No midline shift. No hydrocephalus. Bilateral remote PCA territory infarcts. No pathologic enhancement. Cerebral atrophy. Vascular: Major arterial flow voids are maintained. Skull and upper cervical spine: Normal marrow signal. Sinuses/Orbits: Clear sinuses.  No acute orbital findings. Other: No mastoid effusions. IMPRESSION: 1. 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 2. Bilateral remote PCA territory infarcts. 3.  Cerebral Atrophy (ICD10-G31.9). Electronically Signed   By: Feliberto Harts M.D.   On: 08/05/2023 20:11   EEG adult Result Date: 08/05/2023 Charlsie Quest, MD     08/05/2023 10:01 AM Patient Name: HEARL HEIKES MRN: [id number] Epilepsy Attending: Charlsie Quest Referring Physician/Provider: Caryl Pina, MD Date: 08/05/2023 Duration: 23.59 mins Patient history: 78yo M with shaking spells with depressed level of consciousness. EEG to evaluate for seizure Level of alertness: Awake AEDs during EEG study: VPA Technical aspects: This EEG study was done with scalp electrodes positioned according to the 10-20 International system of electrode placement. Electrical activity was reviewed with band pass filter of 1-70Hz , sensitivity of 7 uV/mm, display speed of 61mm/sec with a 60Hz  notched filter applied as appropriate. EEG data were recorded continuously and digitally  stored.  Video monitoring was available and reviewed as appropriate. Description: The posterior dominant rhythm consists of 8 Hz activity of moderate voltage (25-35 uV) seen predominantly in posterior head regions, symmetric and reactive to eye opening and  eye closing. Physiologic photic driving was seen during photic stimulation. Hyperventilation was not performed.   IMPRESSION: This study is within normal limits. No seizures or epileptiform discharges were seen throughout the recording. A normal interictal EEG does not exclude the diagnosis of epilepsy. Charlsie Quest       The results of significant diagnostics from this hospitalization (including imaging, microbiology, ancillary and laboratory) are listed below for reference.     Microbiology: Recent Results (from the past 240 hours)  MRSA Next Gen by PCR, Nasal     Status: None   Collection Time: 08/05/23  7:22 AM   Specimen: Nasal Mucosa; Nasal Swab  Result Value Ref Range Status   MRSA by PCR Next Gen NOT DETECTED NOT DETECTED Final    Comment: (NOTE) The GeneXpert MRSA Assay (FDA approved for NASAL specimens only), is one component of a comprehensive MRSA colonization surveillance program. It is not intended to diagnose MRSA infection nor to guide or monitor treatment for MRSA infections. Test performance is not FDA approved in patients less than 41 years old. Performed at [facility], [address] [place], [place] [address]      Labs:  CBC: Recent Labs  Lab 08/03/23 1749 08/04/23 1525 08/05/23 0654  WBC 8.2 6.3 6.8  NEUTROABS  --   --  2.9  HGB 14.7 14.6 13.3  HCT 42.6 43.2 38.4*  MCV 96.8 96.4 95.3  PLT 183 177 164   BMP &GFR Recent Labs  Lab 08/03/23 1749 08/04/23 1525 08/05/23 0654 08/06/23 0921 08/06/23 0923  NA 131* 137 135  --  134*  K 4.4 4.1 4.1  --  3.8  CL 99 102 107  --  101  CO2 24 28 22   --  21*  GLUCOSE 118* 97 78  --  120*  BUN 22 20 19   --  11  CREATININE 1.22 1.09 0.97  --   1.02  CALCIUM 9.2 9.9 8.3*  --  9.0  MG  --   --   --  2.0  --   PHOS  --   --   --   --  3.6   Estimated Creatinine Clearance: 68.6 mL/min (by C-G formula based on SCr of 1.02 mg/dL). Liver & Pancreas: Recent Labs  Lab 08/05/23 0654 08/06/23 0923  AST 19  --   ALT 16  --   ALKPHOS 76  --   BILITOT 0.5  --   PROT 6.1*  --   ALBUMIN 3.4* 3.5   No results for input(s): "LIPASE", "AMYLASE" in the last 168 hours. Recent Labs  Lab 08/04/23 1537  AMMONIA 29   Diabetic: Recent Labs    08/06/23 0923  HGBA1C 5.2   Recent Labs  Lab 08/03/23 1737  GLUCAP 106*   Cardiac Enzymes: No results for input(s): "CKTOTAL", "CKMB", "CKMBINDEX", "TROPONINI" in the last 168 hours. Recent Labs    03/25/23 1057  PROBNP 81.0   Coagulation Profile: No results for input(s): "INR", "PROTIME" in the last 168 hours. Thyroid Function Tests: Recent Labs    08/06/23 0921  TSH 5.508*  FREET4 0.87   Lipid Profile: Recent Labs    08/06/23 0923  CHOL 140  HDL 49  LDLCALC 67  TRIG 120  CHOLHDL 2.9   Anemia Panel: Recent Labs    08/05/23 1350  VITAMINB12 1,005*   Urine analysis:    Component Value Date/Time   COLORURINE YELLOW 08/04/2023 2108   APPEARANCEUR CLEAR 08/04/2023 2108  LABSPEC 1.019 08/04/2023 2108   PHURINE 6.5 08/04/2023 2108   GLUCOSEU NEGATIVE 08/04/2023 2108   HGBUR TRACE (A) 08/04/2023 2108   BILIRUBINUR NEGATIVE 08/04/2023 2108   KETONESUR NEGATIVE 08/04/2023 2108   PROTEINUR NEGATIVE 08/04/2023 2108   UROBILINOGEN 0.2 07/05/2013 1342   NITRITE NEGATIVE 08/04/2023 2108   LEUKOCYTESUR NEGATIVE 08/04/2023 2108   Sepsis Labs: Invalid input(s): "PROCALCITONIN", "LACTICIDVEN"   SIGNED:  Almon Hercules, MD  [facility] 08/07/2023, 1:37 PM

## 2023-08-07 NOTE — Progress Notes (Signed)
 Occupational Therapy Treatment Patient Details Name: Luke Golden MRN: [id number] DOB: 1946/03/03 Today's Date: 08/07/2023   History of present illness Luke Golden is a 78 yo male who presented with new seizure-like activity and dizziness that has been occurring for a few months. EEG and acute work up negative for acute findings. PMH of cognitive impairment, pulmonary fibrosis, HTN, OSA not on CPAP and mood disorder   OT comments  Pt progressing towards OT goals this session. Session focused on UB and LB dressing with transfers and use of DME in room for sink level grooming. Pt eager to participate and demonstrated eagerness to complete tasks independently. A for threading BLE through underwear and pants, but able to statically balance and button pants. Pt encouraged to walk with wife around house at least 3x daily (after each meal) utilizing gait belt. Also educated about safe hand placement for transfers and to think of it as a push up to strengthen his arms. Pt and wife verbalizing understanding. POC remains appropriate with HHOT post-acute to complete falls risk assessment as well as maximize safety and independence in ADL and functional transfers in the home setting.       If plan is discharge home, recommend the following:  A little help with walking and/or transfers;A lot of help with bathing/dressing/bathroom;Assistance with cooking/housework;Assistance with feeding;Direct supervision/assist for medications management;Direct supervision/assist for financial management;Assist for transportation;Help with stairs or ramp for entrance;Supervision due to cognitive status   Equipment Recommendations  None recommended by OT    Recommendations for Other Services      Precautions / Restrictions Precautions Precautions: Fall Recall of Precautions/Restrictions: Impaired Precaution/Restrictions Comments: multiple recent falls Restrictions Weight Bearing Restrictions Per Provider Order: No        Mobility Bed Mobility Overal bed mobility: Needs Assistance Bed Mobility: Supine to Sit, Sit to Supine     Supine to sit: Supervision Sit to supine: Supervision        Transfers Overall transfer level: Needs assistance Equipment used: 1 person hand held assist, Straight cane Transfers: Sit to/from Stand, Bed to chair/wheelchair/BSC Sit to Stand: Min assist     Step pivot transfers: Min assist     General transfer comment: assist to power up, and balance     Balance Overall balance assessment: Needs assistance Sitting-balance support: Feet supported, Bilateral upper extremity supported Sitting balance-[name] Scale: Fair     Standing balance support: No upper extremity supported, During functional activity Standing balance-[name] Scale: Fair Standing balance comment: statically, easily LOB during dynamic movement and turns                           ADL either performed or assessed with clinical judgement   ADL Overall ADL's : Needs assistance/impaired     Grooming: Wash/dry hands;Contact guard assist;Standing Grooming Details (indicate cue type and reason): sink level         Upper Body Dressing : Set up;Sitting Upper Body Dressing Details (indicate cue type and reason): tshirt Lower Body Dressing: Moderate assistance;Sit to/from stand Lower Body Dressing Details (indicate cue type and reason): OT threaded feet through underwear and psnts, Pt able to complete sit<>stand and pull up both once at the knees Toilet Transfer: Minimal assistance;Ambulation Toilet Transfer Details (indicate cue type and reason): SPC         Functional mobility during ADLs: Minimal assistance;Cane (gait belt) General ADL Comments: limited by cognition, weakness, activity tolerance, balance    Extremity/Trunk Assessment Upper  Extremity Assessment Upper Extremity Assessment: Generalized weakness   Lower Extremity Assessment Lower Extremity Assessment: Defer to PT  evaluation        Vision   Vision Assessment?: No apparent visual deficits   Perception Perception Perception: Not tested   Praxis Praxis Praxis: Not tested   Communication Communication Communication: No apparent difficulties   Cognition Arousal: Alert Behavior During Therapy: WFL for tasks assessed/performed Cognition: History of cognitive impairments             OT - Cognition Comments: cog impairment at baseline, pt followed most simple 1 step commands.                 Following commands: Impaired Following commands impaired: Only follows one step commands consistently      Cueing   Cueing Techniques: Verbal cues, Gestural cues, Tactile cues  Exercises      Shoulder Instructions       General Comments VSS    Pertinent Vitals/ Pain       Pain Assessment Pain Assessment: No/denies pain  Home Living                                          Prior Functioning/Environment              Frequency  Min 1X/week        Progress Toward Goals  OT Goals(current goals can now be found in the care plan section)  Progress towards OT goals: Progressing toward goals  Acute Rehab OT Goals Patient Stated Goal: get home ASAP OT Goal Formulation: With patient/family Time For Goal Achievement: 08/19/23 Potential to Achieve Goals: Good ADL Goals Pt Will Perform Upper Body Dressing: Independently Pt Will Perform Lower Body Dressing: with supervision;sit to/from stand Pt Will Transfer to Toilet: with supervision;ambulating  Plan      Co-evaluation                 AM-PAC OT "6 Clicks" Daily Activity     Outcome Measure   Help from another person eating meals?: A Little Help from another person taking care of personal grooming?: A Little Help from another person toileting, which includes using toliet, bedpan, or urinal?: A Little Help from another person bathing (including washing, rinsing, drying)?: A Little Help from  another person to put on and taking off regular upper body clothing?: A Little Help from another person to put on and taking off regular lower body clothing?: A Little 6 Click Score: 18    End of Session Equipment Utilized During Treatment: Gait belt (SPC)  OT Visit Diagnosis: Unsteadiness on feet (R26.81);Other abnormalities of gait and mobility (R26.89);Repeated falls (R29.6);Muscle weakness (generalized) (M62.81);History of falling (Z91.81)   Activity Tolerance Patient tolerated treatment well   Patient Left in bed;with call bell/phone within reach;with bed alarm set;with family/visitor present   Nurse Communication Mobility status        Time: 1610-9604 OT Time Calculation (min): 22 min  Charges: OT General Charges $OT Visit: 1 Visit OT Treatments $Self Care/Home Management : 8-22 mins  Nyoka Cowden OTR/L Acute Rehabilitation Services Office: [phone]  Evern Bio [facility] 08/07/2023, 12:56 PM

## 2023-08-10 ENCOUNTER — Encounter: Payer: Self-pay | Admitting: Internal Medicine

## 2023-08-11 ENCOUNTER — Encounter: Payer: Self-pay | Admitting: Cardiovascular Disease

## 2023-08-11 ENCOUNTER — Ambulatory Visit
Payer: No Typology Code available for payment source | Attending: Cardiovascular Disease | Admitting: Cardiovascular Disease

## 2023-08-11 VITALS — BP 120/54 | HR 67 | Ht 68.0 in | Wt 210.8 lb

## 2023-08-11 DIAGNOSIS — J84112 Idiopathic pulmonary fibrosis: Secondary | ICD-10-CM | POA: Diagnosis not present

## 2023-08-11 DIAGNOSIS — I519 Heart disease, unspecified: Secondary | ICD-10-CM

## 2023-08-11 DIAGNOSIS — I2510 Atherosclerotic heart disease of native coronary artery without angina pectoris: Secondary | ICD-10-CM | POA: Diagnosis not present

## 2023-08-11 DIAGNOSIS — I10 Essential (primary) hypertension: Secondary | ICD-10-CM

## 2023-08-11 DIAGNOSIS — I639 Cerebral infarction, unspecified: Secondary | ICD-10-CM | POA: Diagnosis not present

## 2023-08-11 NOTE — Telephone Encounter (Signed)
 [name], thank you so much for updating [place].  Our thoughts and prayers with Mr. Riddles and you and Best wishes for a speedy recovery.
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
## 2023-08-11 NOTE — Patient Instructions (Signed)
 Medication Instructions:  No changes. *If you need a refill on your cardiac medications before your next appointment, please call your pharmacy*   Follow-Up: At [facility], you and your health needs are our priority.  As part of our continuing mission to provide you with exceptional heart care, we have created designated Provider Care Teams.  These Care Teams include your primary Cardiologist (physician) and Advanced Practice Providers (APPs -  Physician Assistants and Nurse Practitioners) who all work together to provide you with the care you need, when you need it.  We recommend signing up for the patient portal called "MyChart".  Sign up information is provided on this After Visit Summary.  MyChart is used to connect with patients for Virtual Visits (Telemedicine).  Patients are able to view lab/test results, encounter notes, upcoming appointments, etc.  Non-urgent messages can be sent to your provider as well.   To learn more about what you can do with MyChart, go to [email or web address].    Your next appointment:    As needed.   Provider:   Thurmon Fair, MD     Other Instructions

## 2023-08-15 ENCOUNTER — Encounter: Payer: Self-pay | Admitting: Cardiovascular Disease

## 2023-08-24 ENCOUNTER — Other Ambulatory Visit: Payer: Self-pay

## 2023-08-24 ENCOUNTER — Emergency Department (HOSPITAL_COMMUNITY)
Admission: EM | Admit: 2023-08-24 | Discharge: 2023-08-25 | Disposition: A | Discharge status: Home / Self Care | Attending: Emergency Medicine | Admitting: Emergency Medicine

## 2023-08-24 ENCOUNTER — Emergency Department (HOSPITAL_COMMUNITY)

## 2023-08-24 DIAGNOSIS — Z7902 Long term (current) use of antithrombotics/antiplatelets: Secondary | ICD-10-CM | POA: Diagnosis not present

## 2023-08-24 DIAGNOSIS — Z7982 Long term (current) use of aspirin: Secondary | ICD-10-CM | POA: Diagnosis not present

## 2023-08-24 DIAGNOSIS — Z79899 Other long term (current) drug therapy: Secondary | ICD-10-CM | POA: Insufficient documentation

## 2023-08-24 DIAGNOSIS — I10 Essential (primary) hypertension: Secondary | ICD-10-CM | POA: Diagnosis not present

## 2023-08-24 DIAGNOSIS — R55 Syncope and collapse: Secondary | ICD-10-CM | POA: Insufficient documentation

## 2023-08-24 DIAGNOSIS — I959 Hypotension, unspecified: Secondary | ICD-10-CM | POA: Diagnosis not present

## 2023-08-24 DIAGNOSIS — R569 Unspecified convulsions: Secondary | ICD-10-CM

## 2023-08-24 DIAGNOSIS — R0902 Hypoxemia: Secondary | ICD-10-CM | POA: Diagnosis not present

## 2023-08-24 LAB — CBC WITH DIFFERENTIAL/PLATELET
Abs Immature Granulocytes: 0.04 10*3/uL (ref 0.00–0.07)
Basophils Absolute: 0.1 10*3/uL (ref 0.0–0.1)
Basophils Relative: 1 %
Eosinophils Absolute: 0.5 10*3/uL (ref 0.0–0.5)
Eosinophils Relative: 5 %
HCT: 44.9 % (ref 39.0–52.0)
Hemoglobin: 15.1 g/dL (ref 13.0–17.0)
Immature Granulocytes: 0 %
Lymphocytes Relative: 13 %
Lymphs Abs: 1.2 10*3/uL (ref 0.7–4.0)
MCH: 33.4 pg (ref 26.0–34.0)
MCHC: 33.6 g/dL (ref 30.0–36.0)
MCV: 99.3 fL (ref 80.0–100.0)
Monocytes Absolute: 0.9 10*3/uL (ref 0.1–1.0)
Monocytes Relative: 10 %
Neutro Abs: 6.4 10*3/uL (ref 1.7–7.7)
Neutrophils Relative %: 71 %
Platelets: 174 10*3/uL (ref 150–400)
RBC: 4.52 MIL/uL (ref 4.22–5.81)
RDW: 12.7 % (ref 11.5–15.5)
WBC: 9 10*3/uL (ref 4.0–10.5)
nRBC: 0 % (ref 0.0–0.2)

## 2023-08-24 LAB — BASIC METABOLIC PANEL
Anion gap: 8 (ref 5–15)
BUN: 18 mg/dL (ref 8–23)
CO2: 24 mmol/L (ref 22–32)
Calcium: 8.6 mg/dL — ABNORMAL LOW (ref 8.9–10.3)
Chloride: 102 mmol/L (ref 98–111)
Creatinine, Ser: 1 mg/dL (ref 0.61–1.24)
GFR, Estimated: >60 mL/min (ref >60)
Glucose, Bld: 120 mg/dL — ABNORMAL HIGH (ref 70–99)
Potassium: 3.9 mmol/L (ref 3.5–5.1)
Sodium: 134 mmol/L — ABNORMAL LOW (ref 135–145)

## 2023-08-24 LAB — TROPONIN I (HIGH SENSITIVITY)
Troponin I (High Sensitivity): 11 ng/L
Troponin I (High Sensitivity): 9 ng/L (ref <18)

## 2023-08-24 LAB — CBG MONITORING, ED: Glucose-Capillary: 140 mg/dL — ABNORMAL HIGH (ref 70–99)

## 2023-08-24 MED ORDER — LEVETIRACETAM 500 MG PO TABS: 500.0000 mg | ORAL_TABLET | Freq: Two times a day (BID) | ORAL | 2 refills | Status: DC

## 2023-08-24 MED ORDER — LEVETIRACETAM IN NACL 1000 MG/100ML IV SOLN
1000.0000 mg | Freq: Once | INTRAVENOUS | Status: AC
  Administered 2023-08-24: 1000 mg via INTRAVENOUS
  Filled 2023-08-24: qty 100

## 2023-08-24 MED ORDER — SODIUM CHLORIDE 0.9 % IV SOLN: 2000.0000 mg | INTRAVENOUS | Status: DC

## 2023-08-24 MED ORDER — LEVETIRACETAM IN NACL 1000 MG/100ML IV SOLN: 1000.0000 mg | INTRAVENOUS | Status: DC

## 2023-08-24 MED ORDER — LORAZEPAM 2 MG/ML IJ SOLN
1.0000 mg | Freq: Once | INTRAMUSCULAR | Status: AC
  Administered 2023-08-24: 1 mg via INTRAVENOUS
  Filled 2023-08-24: qty 1

## 2023-08-24 NOTE — ED Triage Notes (Addendum)
 Pt BIBA for syncopal episode after getting out of the shower. No fall, out a few minutes. Back to baseline. Hx dementia. 22ga LAC. R pupil 2mm, left is 4mm, this is noted previously and not new   Hr 60 120/60 Cbg 120

## 2023-08-24 NOTE — ED Provider Notes (Signed)
  Physical Exam  BP (!) 136/56 (BP Location: Right Arm)   Pulse 66   Temp 98.1 F (36.7 C) (Oral)   Resp 16   SpO2 94%   Physical Exam  Procedures  Procedures  ED Course / MDM   Clinical Course as of 08/24/23 2247  Tue Aug 24, 2023  1621 Assumed care from Dr. Rubin Payor.  78 year old male with history of dementia, hypertension, hyperlipidemia, and stroke who presented to the emergency department with an unwitnessed fall from home.  Was hospitalized early in the month for LOC seizure vs syncope vs movement disorder.  Found to have a stroke at that time.  May have been related to hypotension.  They discontinued his Viagra.  Had another syncopal episode today.  Per family, will happen once a month. Larey Seat in the restroom and others heard him go down but no seizure like activity seen. Was pale and confused afterwards. Came around relatively quick. Had eeg last time that didn't reveal seizures. Workup today is reassuring.  [RP]  1927 Reevaluated the patient.  Discussed the event with his wife to get additional history.  He had about 7 or 8 minutes where his right upper extremity was shaking and his eyes rolled back in his head.  Wife reports that he was confused.  Patient reports that he was aware during the entire event. [RP]  2221 MRI without acute stroke.  Dr Derry Lory from neurology consulted. Recommends starting the patient on keppra 500 mg BID. With 2g load at this time.  [RP]  2246 Wife updated.  Patient will be discharged home with Keppra.  Informed her that he should follow-up with his primary doctor and neurology as an outpatient.  She is unable to pick him up tonight but will come pick him up in the morning.  Informed that he should not drive for 6 months or perform any activities that would be unsafe for him if he should have a seizure. [RP]    Clinical Course User Index [RP] Rondel Baton, MD   Medical Decision Making Amount and/or Complexity of Data Reviewed Labs:  ordered. Radiology: ordered.  Risk Prescription drug management. Decision regarding hospitalization.     Rondel Baton, MD 08/24/23 [phone]

## 2023-08-24 NOTE — ED Provider Triage Note (Signed)
 Emergency Medicine Provider Triage Evaluation Note  Luke Golden , a 78 y.o. male  was evaluated in triage.  Pt complains of syncopal episode after shower today. Has returned to baseline. History of CVA.  Review of Systems  Positive: syncope Negative: Chest pain, shortness of breath, headache  Physical Exam  BP (!) 129/54 (BP Location: Left Arm)   Pulse 64   Temp 97.9 F (36.6 C) (Oral)   Resp 19   SpO2 100%  Gen:   Awake, no distress   Resp:  Normal effort  MSK:   Moves extremities without difficulty  Other:  Stroke screen negative. Unequal pupils present, previously documented in prior visitis  Medical Decision Making  Medically screening exam initiated at 2:12 PM.  Appropriate orders placed.  Liz Malady was informed that the remainder of the evaluation will be completed by another provider, this initial triage assessment does not replace that evaluation, and the importance of remaining in the ED until their evaluation is complete.     Felicie Morn, NP 08/24/23 1415

## 2023-08-24 NOTE — Discharge Instructions (Addendum)
 You were seen for your seizure in the emergency department.   At home, please continue to take the seizure medication that you have been prescribed.  Refrain from activities that could be dangerous when you have a seizure such as climbing a ladder or swimming.  Do not drive for at least 6 months.  Talk to your neurologist to see when it is safe to resume these activities.    Check your MyChart online for the results of any tests that had not resulted by the time you left the emergency department.   Follow-up with your primary doctor in 2-3 days regarding your visit.  Follow-up with your neurologist in 1 week.  Return immediately to the emergency department if you experience any of the following: Seizures lasting more than 5 minutes, or any other concerning symptoms.    Thank you for visiting our Emergency Department. It was a pleasure taking care of you today.
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
## 2023-08-25 ENCOUNTER — Other Ambulatory Visit (HOSPITAL_COMMUNITY): Payer: Self-pay

## 2023-08-26 ENCOUNTER — Encounter: Payer: Self-pay | Admitting: Internal Medicine

## 2023-08-27 ENCOUNTER — Encounter: Payer: Self-pay | Admitting: Internal Medicine

## 2023-08-27 NOTE — Telephone Encounter (Signed)
 Please see mychart message sent by pt's spouse. A second part of the mychart has also been sent to you as well about discharge instructions from recent hospital visit.  No current openings available anytime soon with any provider.  Dr. Marchelle Gearing, please advise what you recommend.

## 2023-08-27 NOTE — Telephone Encounter (Signed)
 Please see mychart message with attachments sent by pt/spouse.

## 2023-08-30 NOTE — Telephone Encounter (Signed)
 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  Plan Give first avail with APP/Dr Marchelle Gearing      Latest Ref Rng & Units 06/10/2023    1:00 PM 02/08/2023   11:38 AM 12/15/2022    9:51 AM 04/21/2022    8:41 AM 12/22/2021   11:50 AM  PFT Results  FVC-Pre L 3.56  3.32  3.49  3.73  3.80   FVC-Predicted Pre % 97  86  91  96  98   Pre FEV1/FVC % % 81  79  81  83  83   FEV1-Pre L 2.90  2.62  2.81  3.11  3.13   FEV1-Predicted Pre % 110  95  102  112  112   DLCO uncorrected ml/min/mmHg 16.82  16.10  16.34  18.04  17.19   DLCO UNC% % 74  69  70  76  73   DLCO corrected ml/min/mmHg 16.82  16.10  16.34  18.04  17.19   DLCO COR %Predicted % 74  69  70  76  73   DLVA Predicted % 79  77  78  80  81   TLC L     5.98   TLC % Predicted %     89   RV % Predicted %     91

## 2023-09-01 ENCOUNTER — Telehealth: Payer: Self-pay | Admitting: Emergency Medicine

## 2023-09-01 ENCOUNTER — Encounter: Payer: Self-pay | Admitting: *Deleted

## 2023-09-01 DIAGNOSIS — I639 Cerebral infarction, unspecified: Secondary | ICD-10-CM

## 2023-09-01 NOTE — Telephone Encounter (Signed)
 Originally sent instructions for [employer]. Sent Corrected instructions for the [employer] Monitor:  [employer] CARDIAC EVENT MONITOR INSTRUCTIONS   Your physician has requested you wear a cardiac event monitor for _30_ days. [employer] may call or text to confirm a shipping address. The monitor will be sent to a land address via UPS. [employer] will not ship a monitor to a PO BOX. It typically takes 3-5 days to receive your monitor after it has been enrolled. [employer] will assist with USPS tracking if your package is delayed. The telephone number for [employer] is   [phone].   Once you have received your monitor, please review the enclosed instructions. Instruction tutorials can also be viewed under help and settings on the enclosed cell phone. Your monitor has already been registered assigning a specific monitor serial # to you.   Billing and Self Pay Discount Information  [employer] has been provided the insurance information we had on file for you.  If your insurance has been updated, please call [employer] at [phone] to provide them with your updated insurance information.   [employer] offers a discounted Self Pay option for patients who have insurance that does not cover their cardiac event monitor or patients without insurance.  The discounted cost of a Self Pay Cardiac Event Monitor would be $225.00 , if the patient contacts [employer] at [phone] within 7 days of applying the monitor to make payment arrangements.  If the patient does not contact [employer] within 7 days of applying the monitor, the cost of the cardiac event monitor will be $350.00.  Applying the monitor    Remove cell phone from case and turn it on. The cell phone works as IT consultant and needs to be always within 10 feet of you. The cell phone will need to be charged daily. We recommend you plug the cell phone into the enclosed charger at your bedside table every night.   Monitor batteries: You will  receive two monitor batteries labelled #1 and #2. These are your recorders. Plug battery #2 onto the second connection on the enclosed charger. Always keep one battery on the charger. This will keep the monitor battery deactivated. It will also keep it fully charged for when you need to switch your monitor batteries. A small light will blink on the battery emblem when it is charging. The light on the battery emblem will remain on when the battery is fully charged.    Open package of a Monitor strip. Insert battery #1 into black hood on strip and gently squeeze monitor battery onto connection as indicated in instruction booklet. Set aside while preparing skin.   Choose the location for your strip, vertical or horizontal, as indicated in the instruction booklet. Shave to remove all hair from location. There cannot be any lotions, oils, powders, or colognes on skin where monitor is to be applied. Wipe skin clean with enclosed Saline wipe. Dry skin completely.    Peel paper labelled #1 off the back of the Monitor strip exposing the adhesive. Place the monitor on the chest in the vertical or horizontal position shown in the instruction booklet. One arrow on the monitor strip must be pointing upward. Carefully remove paper labelled #2, attaching remainder of strip to your skin. Try not to create any folds or wrinkles in the strip as you apply it.    Firmly press and release the circle in the center of the monitor battery. You will hear a small beep. This is turning the monitor battery on. The heart  emblem on the monitor battery will light up every 5 seconds if the monitor battery is turned on and connected to the patient securely. Do not push and hold the circle down as this turns the monitor battery off. The cell phone will locate the monitor battery. A screen will appear on the cell phone checking the connection of your monitor strip. This may read poor connection initially but change to good connection within  the next minute. Once your monitor accepts the connection you will hear a series of 3 beeps followed by a climbing crescendo of beeps. A screen will appear on the cell phone showing the two monitor strip placement options. Touch the picture that demonstrates where you applied the monitor strip.    Your monitor strip and battery are waterproof. You will be able to shower, bathe, or swim with the monitor on. Please do not submerge deeper than 3 feet underwater. We recommend removing the monitor if you are swimming in a lake, river, or ocean.    Your monitor battery will need to be switched to a fully charged monitor battery approximately once a week. The cell phone will alert you of an action which needs to be taken.    On the cell phone, tap for details to reveal connection status, monitor battery status, and cell phone battery status. A green dot will indicate your monitor is in good status, however, a red dot will indicate something that needs your attention.    To record a symptom, click the circle on the monitor battery. In 30-60 seconds, a list of symptoms will appear on the cell phone. Select your symptoms and tap save. Your monitor will record a sustained or significant arrhythmia regardless of you clicking the button. Some patients do not feel the heart rhythm irregularities. [employer] will notify us of any serious or critical events.   Refer to instruction booklet for instructions on switching batteries, changing strips, the Do not disturb or Pause features, or any additional questions.   Call [employer] at [phone], to confirm your monitor is transmitting and record your baseline. They will answer any questions you may have regarding the monitor instructions at that time.   Returning the monitor to [employer]   Place all equipment back into the blue box. Peel off strip of paper to expose adhesive and close box securely. There is a prepaid UPS shipping label on this box. Drop in a UPS  drop box, or at a UPS facility like [employer]. You may also contact [employer] to arrange UPS to pick up monitor package at your home.   Thank you,   Shelly/ Monitors  [facility]  [phone]

## 2023-09-01 NOTE — Telephone Encounter (Signed)
 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 Will be mailed to home with directions. Will send directions over mychart as well. She verbalized understanding.

## 2023-09-01 NOTE — Progress Notes (Unsigned)
 Patient enrolled for [employer]/ [employer] to ship a 30 day cardiac event monitor to his address on file.

## 2023-09-10 ENCOUNTER — Ambulatory Visit: Admitting: Internal Medicine

## 2023-09-10 ENCOUNTER — Encounter: Payer: Self-pay | Admitting: Internal Medicine

## 2023-09-10 VITALS — BP 106/62 | HR 64

## 2023-09-10 DIAGNOSIS — Z77098 Contact with and (suspected) exposure to other hazardous, chiefly nonmedicinal, chemicals: Secondary | ICD-10-CM

## 2023-09-10 DIAGNOSIS — J84112 Idiopathic pulmonary fibrosis: Secondary | ICD-10-CM | POA: Diagnosis not present

## 2023-09-10 NOTE — Progress Notes (Signed)
 OV 11/18/2021  Subjective:  Patient ID: Liz Malady, male , DOB: [birth year] , age 78 y.o. , MRN: [id number] , ADDRESS: [address] [place] [address] PCP [facility] Patient Care Team: [facility] as PCP - General  This Provider for this visit: Treatment Team:  Attending Provider: Kalman Shan, MD PCP Dr Jess Barters at [facility]   11/18/2021 -   Chief Complaint  Patient presents with   Consult    Pt recently had a PFT performed and is here today to go over the results. Pt does have complaints of an occasional cough and SOB that is worse with exertion.     HPI DESMON HITCHNER 78 y.o. -referred by the [facility].  History is obtained from talking to him and his wife.  They live in [place].  Wife works as a Psychologist, occupational at [employer].  He is asked weight no more veteran.  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  Denies any agent orange exposure.  Former smoker.  According to the wife for the last few years he has had exertional fatigue and also shortness of breath.  But insidiously and gradually is gotten worse.  Sometime towards the end of 2022 at the beginning of 2023 he did have a viral syndrome [facility].  Then in the visit with the [facility] in April 2023 it appears that he started complaining of exertional fatigue and shortness of breath.  Was fairly significant.  They did a chest x-ray on him on September 12, 2021.  Only have the report.  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  He has old chest x-ray here that I personally visualized shows some chronic atelectatic changes.  They did a pulmonary function test and it shows normal FEV1 FVC TLC but isolated reduction in diffusion capacity to 64.2% overall wife says that he is got a lot of fatigue dyspnea lower stamina.  Things are getting worse.  His past medical history is significant for  short-term memory loss, left femoral DVT in 2000.  Denies any substance abuse or tobacco abuse alcohol use in the last 20 years.  He has obstructive sleep apnea moderate but he has quit using his CPAP in the last week.  This is because of facial fit issues.  He also has back pain PTSD coronary artery disease hyperlipidemia acid reflux hypertension.   According to him he had a CT scan of the chest but we could not find evidence for this.  According to my CMA has had a echocardiogram at the [place] but I could not find evidence for this.  He had a good walking desaturation test here.     CT Chest data - Nov 2015  CLINICAL DATA:  Chest pain.   EXAM: PORTABLE CHEST - 1 VIEW   COMPARISON:  July 05, 2013.   FINDINGS: Stable cardiomediastinal silhouette. No pneumothorax or pleural effusion is noted. Minimal bibasilar subsegmental atelectasis is noted. Old right rib fractures are noted.   IMPRESSION: Minimal bibasilar subsegmental atelectasis.     Electronically Signed   By: Roque Lias M.D.   On: 04/23/2014 12:29  No results found.  Nuclear Medicine Stress tst 2017  IMPRESSION: 1. No scintigraphic evidence of prior infarction or pharmacologically induced ischemia.   2. Normal left ventricular wall motion.   3. Left ventricular ejection fraction  59%   4. Non invasive risk stratification*: Low   *2012 Appropriate Use Criteria for Coronary Revascularization Focused Update: J Am Coll Cardiol. 2012;59(9):857-881. http://content.dementiazones.com.aspx?articleid=[email or web address]     Electronically Signed   By: Simonne Come M.D.   On: 01/28/2016 13:07       12/22/2021: Today-follow-up Patient presents today for follow-up after undergoing high-resolution CT scan, ILD panel and pulmonary function testing.  Pulmonary function testing was completed today and showed normal spirometry and lung volumes.  He did have a mild reduction in DLCO at 73%.  He was unable to complete  postbronchodilator spirometry.  ILD panel was negative aside from low positive ANA and QuantiFERON gold.  No active evidence of TB on recent imaging.  He has never been told in the past that he has TB or has been exposed to it.  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  Today, he reports feeling unchanged when compared to when he was here last.  Breathing is overall stable.  He does get winded, primarily with climbing and long distances.  He also has ongoing issues with fatigue; however, he had stopped using his CPAP, which could be a large contributing factor.  He has since restarted and feels like he has gotten more restful sleep and wakes up feeling better in the morning.  He denies any recent fevers, night sweats, hemoptysis, weight loss, anorexia, lower extremity edema, orthopnea. Denies joint pain, dry mouth or eyes. He has yet to complete ONO and echocardiogram.  Never tried any inhalers for his shortness of breath.   IMPRESSION: 1. Findings are indicative of interstitial lung disease, clearly progressive compared to remote prior study from 2012, with a spectrum of findings considered probable usual interstitial pneumonia (UIP) per current ATS guidelines. Repeat high-resolution chest CT is suggested in 12 months to assess for temporal changes in the appearance of the lung parenchyma. 2. Aortic atherosclerosis, in addition to left main and three-vessel coronary artery disease. Assessment for potential risk factor modification, dietary therapy or pharmacologic therapy may be warranted, if clinically indicated. 3. There are calcifications of the aortic valve and mitral annulus. Echocardiographic correlation for evaluation of potential valvular dysfunction may be warranted if clinically indicated. 4. Cholelithiasis.   Aortic Atherosclerosis (ICD10-I70.0).     Electronically Signed   By: Trudie Reed M.D.    On: 12/09/2021 09:38  OV 01/27/2022  Subjective:  Patient ID: Liz Malady, male , DOB: 1945-11-17 , age 78 y.o. , MRN: [id number] , ADDRESS: [address] [place] [address] PCP [facility] Patient Care Team: [facility] as PCP - General  This Provider for this visit: Treatment Team:  Attending Provider: Kalman Shan, MD    01/27/2022 -   Chief Complaint  Patient presents with   Follow-up    Pt states he has been doing okay since last visit. Pt is still SOB but states it is about the same since last visit.    HPI TAHSIN BENYO 78 y.o. -returns for follow-up.  I last saw him a few months ago and then he saw a Publishing rights manager.  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  INH and rifampin have been commenced.  After that he called our office or at least the wife called and reported that he was  having shakes and trembles and frequent falls.  Also complaining of dyspnea on exertion.  Neither of them are able to tell me that if the symptoms are worse than before or of the onset of it started after the starting of INH and rifampin.  Definitely the call happened for the shakes and falls and the tremors after he got started on INH and rifampin.  But they are unable to explicitly tell me the duration.  In addition he was given ILD question in July 2023 by the nurse practitioner.  He was supposed to bring it with him today but I do not have it.  The wife believes that she fill it up and brought it and gave it to [place].  He does admit to previous street drug use.  He also admits to previous heavy smoking.  He also admits to agent orange exposure for a year while he was in [place].  Of note the wife works at [employer].  She is now needing to care for him.  [facility] her to care for him.  But she needs a letter of support.  I did indicate to her about the FMLA  option.  But she does want a caregiver support letter.      OV 04/21/2022  Subjective:  Patient ID: Liz Malady, male , DOB: May 08, 1946 , age 74 y.o. , MRN: [id number] , ADDRESS: [address] [place] [address] PCP [facility] Patient Care Team: [facility] as PCP - General  This Provider for this visit: Treatment Team:  Attending Provider: Kalman Shan, MD    04/21/2022 -   Chief Complaint  Patient presents with   Follow-up    PFT performed today.  Pt finished taking the TB medication about 1 month ago. Still coughing up phlegm in the mornings that is clear in color. States that he does have complaints of SOB.  HPI MURAT RIDEOUT 78 y.o. -returns for follow-up.  He is here to discuss his pulmonary function test and symptoms symptoms are stable.  Pulmonary function test is stable.  He has completed his INH treatment.  Gave him and his wife diagnosis of IPF based on above criteria.  This was reminded and read discussion because the short-term memory loss.  In the interim he did get COVID-19 in September 2023 and was on Paxlovid.  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  There are no new issues.   We discussed several aspects of his care plan.  We discussed clinical trials but given his short-term memory loss and inability to consent and his lack of interest in "being a [place] pig" they are not interested in that at this point in time.  They might revisit this in the future.  Discussed pirfenidone and nintedanib.  He does not want to do nintedanib because of the diarrhea side effect.  He feels he is overweight and would rather go with pirfenidone which would give him some nausea and anorexia and possible weight loss. Discussed the fact is 3 pills 3 times daily and just apply sunscreen and he is to space this out 5 to 6 hours apart.  His wife made notes about all this.  Liver function test monitoring is required.  They decided to go with  pirfenidone.   06/02/2022: Today - follow up - NP Patient presents today for follow up with his wife. He started Esbriet around 2 weeks ago. He is getting ready to start his third  week and maintenance dose of 3 pills, three times a day. He has tolerated it well so far. His wife does feel like he has been coughing more at night since he started it but otherwise, his symptoms are stable. Cough is only productive in AM with clear sputum. He does feel like his reflux is a little worse than it has been; otherwise, no GI issues. No fevers, chills, night sweats, hemoptysis. He knows he needs to be more active, which he is going to work on.    OV 08/18/2022  Subjective:  Patient ID: Liz Malady, male , DOB: 06/16/1945 , age 68 y.o. , MRN: [id number] , ADDRESS: [address] [place] [address] PCP [facility] Patient Care Team: [facility] as PCP - General  This Provider for this visit: Treatment Team:  Attending Provider: Kalman Shan, MD    08/18/2022 -   Chief Complaint  Patient presents with   Follow-up    Pft review, cough all the times started in ( September ) does use robitussin. Post covid.      HPI BANDON SHERWIN 78 y.o. -returns for follow-up.  He presents with his wife [name].  His wife [name] is on a wheelchair because she fractured her feet in February 2024.  Normally she is the caregiver driving him but this time he drove her.  He tells me that over a month ago he stopped taking pirfenidone because he was tired and dizzy and constipated and occasional nausea.  He did not inform his wife about it.  Instead he was giving her the empty pillbox.  She only knew about it today when he revealed this information to me.  But from a shortness of breath standpoint he feels stable.  Shortness of breath score is stable.  However he does have significant amount of cough.  Robitussin controls it.  Wife does not want to pay for over-the-counter Robitussin but is  asking for a prescription so they can get the prescription from the [place] at low cost/no cost.  However review of his medications reveals that he is taking lisinopril.  He is willing to stop this.  He did agree that if stopping this and switching over to another medication of my recommendation does not help then he is willing to try other medications or further testing.   He was supposed to have interim pulmonary function test.  Apparently was done at the [facility] but we have not obtained it.  CMA reports it is very difficult to get pulmonary function test from the [place].  OV 10/06/2022  Subjective:  Patient ID: Liz Malady, male , DOB: 1945-08-08 , age 29 y.o. , MRN: [id number] , ADDRESS: [address] [place] [address] PCP [facility] Patient Care Team: [facility] as PCP - General  This Provider for this visit: Treatment Team:  Attending Provider: Kalman Shan, MD     10/06/2022 -   Chief Complaint  Patient presents with   Follow-up    F/up on IPF     HPI ALCARIO TINKEY 78 y.o. -returns for follow-up.  Presents with his wife [name].  After the last visit they complained about pirfenidone side effect so we switched him to nintedanib but they went home and spoke to the pharmacist and apparently the pharmacist said that he was on borrowed time and therefore he restarted his pirfenidone.  Today says he is tolerating it well.  Although the wife and  he said that he is sleeping a lot and he gets up late.  It is unclear if this got worse after the pirfenidone start.  It seems like it has been going on for quite a while.  Last visit I also told him to stop his lisinopril but his cough continues.  It is quite severe.  He takes Robitussin which controls it.  It is unclear if the cough is improved after stopping lisinopril the wife does say that she does not hear him cough is much but it could be because of Robitussin.  Shortness of breath appears to be the  same but the main complaint is that there is a lot of fatigue and sleepiness.  He is not using his CPAP.  I did a sit/stand test on him 10 times.  He did desaturate to 87/86%.  And he recovered with rest.  He did get a little dyspneic.  He does not have portable oxygen he does not have night oxygen.    OV 12/15/2022  Subjective:  Patient ID: Liz Malady, male , DOB: Nov 18, 1945 , age 60 y.o. , MRN: [id number] , ADDRESS: [address] [place] [address] PCP [facility] Patient Care Team: [facility] as PCP - General  This Provider for this visit: Treatment Team:  Attending Provider: Kalman Shan, MD   12/15/2022 -   Chief Complaint  Patient presents with   Follow-up    Pft, ct f/u,      HPI DAIMIAN SUDBERRY 78 y.o. -returns for follow-up.  Presents with his wife.  Wife is independent historian in fact most of the historian.   IPF: Dyspnea stable.  Pulmonary function test stable.  Did CT scan of the chest personally visualized and agree.  IPF itself is stable.  He has been on pirfenidone for over 6 months at this point.  He and his wife are pleased to hear this  High risk prescription with pirfenidone: Requires intensive therapeutic monitoring because of significant GI side effects and drug-induced liver injury.  He will have liver test today.  Of note wife is concerned that office delayed in getting the approval from the [facility].  The last prescription for the pirfenidone he has is today.  After this he is getting a overnight shipment.  I have sent a message to the pharmacy team to see if he can do any improvement in process   Cough: Continues to be problematic.  Cough is severe despite stopping lisinopril last visit.  Allergy panel was negative.  But his blood eosinophils are high although his nitric oxide test today is normal.  There is some mild emphysema.  We talked about empirically covering this with inhaled corticosteroid/long-acting  beta agonist.  He is open to this idea.  I communicated this with the pharmacy team because they have to get approval from the [place].  If this does not work we could try gabapentin or Stiolto.  Last resort would be chronic prednisone.  Biologic against eosinophils would be an option but would be very tough to get approval   Dyspnea: With exercise hypoxemia this is stable.  At last visit he had excess hypoxemia with sit/stand test but they could not reproduce it at the [place] and the [place] refused to give him oxygen.  Will get him to pulmonary rehabilitation.  Even this the approval from the [place] has been delayed but if he can do this then we will test his exercise hypoxemia test at  the rehab and we can reassess his oxygen need at that time  Fatigue: This continues to be problematic I do not think pirfenidone is made this worse.  His fatigue scores are significantly high.  Will see what happens with pulmonary rehabilitation.  CT Chest data from date: 12/14/22  - personally visualized and independently interpreted : yes - my findings are: same as below   OV 06/10/2023  Subjective:  Patient ID: Liz Malady, male , DOB: Oct 16, 1945 , age 47 y.o. , MRN: [id number] , ADDRESS: [address] [place] [address] PCP [facility] Patient Care Team: [facility] as PCP - General  This Provider for this visit: Treatment Team:  Attending Provider: Kalman Shan, MD    06/10/2023 -   Chief Complaint  Patient presents with   Follow-up    Pt states cough has calmed down, still fatigue. Breathing is okay, using esbriet and inhalers prn.      HPI ERHARD SENSKE 78 y.o. -presents for follow-up.  Presents with his wife.  Wife is the main historian.  She states that his dementia is getting worse.  He has intermittent episodes of complete forgetfulness and lucidity.  He also admits that he is not able to remember many things.  In fact his dementia has been so bad that he was not  able to complete pulmonary rehabilitation because he was forgetting instructions.  He wants to exercise on his own but his wife is worried that he spending all his day just sedentary.  She does not think he has a memory capacity to exercise on his own.  In addition they both are reporting constant and severe fatigue.  Fatigue is rated as a level 3 out of 5 on the symptom score sheet by the wife but I believe it is more severe than that.  He did trial stopping pirfenidone and his wife says that stopping pirfenidone did not make any difference with the fatigue.  He did have an echocardiogram and a BNP.  BNP was normal echocardiogram had a low normal ejection fraction.  I did explain to him and his wife that that ejection fraction should not make him so fatigued.  Nevertheless the interested in a cardiology opinion and will make a referral.  [facility] and he is wife tells me that we need to get the [place] approval.  In terms of his chronic cough this is better.  Currently is tolerating his pirfenidone at full dose well.  Of note wife once images hardcopy.  I did tell the CMA to facilitate and advised him on the paperwork to get this done.  She wants to take this to the [place].     OV 09/10/2023  Subjective:  Patient ID: Liz Malady, male , DOB: 1946/05/31 , age 50 y.o. , MRN: [id number] , ADDRESS: [address] [place] [address] PCP [facility] Patient Care Team: [facility] as PCP - General Croitoru, Rachelle Hora, MD as PCP - Cardiology (Cardiology)  This Provider for this visit: Treatment Team:  Attending Provider: Kalman Shan, MD    09/10/2023 -   Chief Complaint  Patient presents with   Hospitalization Follow-up    He states that his breathing is doing fine. He c/o low energy and runny nose.     Follow-up  -  idiopathic pulmonary fibrosis [IPF-diagnosis given December 22, 2021 by nurse practitioner  -Male gender, age greater than 47, remote  heavy smoking, agent orange exposure, probable UIP by CT chest with progression and negative serology.   - Esbrrit start nov/dec 2023 -> stopped feb 2024: estarted pirfenidone 08/18/2022   -QuantiFERON gold positive.  Seen Dr. Orvan Falconer 01/01/2022 and started on INH and rifampin x51-month  -Short-term memory loss and wife with significant caregiver burden  -Getting worse as of January 2025.  -Sleep apnea does not use CPAP  -Severe baseline chronic cough.   - Stop lisinopril March 2024. - blood eos high 400 cells/mm 10/06/22; negative IgE and RAST allergy panel - normal feno 12/15/2022  - fibrois and some emphyseya on CT    -High risk prescription pirfenidone with need for intensive therapeutic monitoring  - Esbriet/Pirfenidone requires intensive drug monitoring due to high concerns for Adverse effects of , including  Drug Induced Liver Injury, significant GI side effects that include but not limited to Diarrhea, Nausea, Vomiting,  and other system side effects that include Fatigue, headaches, weight loss and other side effects such as skin rash. These will be monitored with  blood work such as LFT initially once a month for 6 months and then quarterly  HPI CORWYN VORA 78 y.o. -returns for follow-up.  Presents with his wife [name].  Since the last visit he is deteriorated physically and neurologically.  There was no ER visit with seizures.  He had MRI of the brain.  According to the wife his dementia is worse.  He is on antiepileptics.  He has had "5 mini strokes".  He has had intermittent incontinence.  He is on near-total care.  His ECOG is 3.  He spends a day watching TV.  He needs help just going to the bathroom and changing clothes but he can eat on his own.  There is a home health aide to give a shower.  They are trying to help him walk with a walker.  He is using a cane and a walker.  Shortness of breath itself is stable.  Does a lot of fatigue.  Right now is on polypharmacy with antiepileptics  and antifibrotic.  I did indicate to them I do not see the benefit of pirfenidone anymore given his neurologic decline, polypharmacy and relative stability in the IPF and significant physical frailty     SYMPTOM SCALE - ILD 01/27/2022 04/21/2022  08/18/2022 225# - esbreit stoppd x 1 oth 10/06/2022 Back on esbriet since last OV 12/15/2022 Esbriet [facility] refused o2 [facility] yewt to authorize rehab 06/10/2023 - wfie filled out   Current weight        O2 use ra ra  ra ra ra  Shortness of Breath 0 -> 5 scale with 5 being worst (score 6 If unable to do)       At rest 2 2 2 5 3 3   Simple tasks - showers, clothes change, eating, shaving 2 1 3 3 3 4   Household (dishes, doing bed, laundry) 3 2 4 3 3 4   Shopping 4.5 2 3 3 4 4   Walking level at own pace 4 1 4 3 4  3.5  Walking up Stairs 5 2 4 4 4 4   Total (30-36) Dyspnea Score 20.5 11 20 21 21 22   How bad is your cough? 2 2 4 4  - off ace inhibbit Feno 8 ppbc, cug 5, start breo empiric   How bad is your fatigue 4 intermitten 4 0 4 2  How bad is nausea 0 0 0 0 0 3  How bad is vomiting?  0  0 0 0 0 0  How bad is diarrhea? 0 0 0 0  0  How bad is anxiety? 2 3 4 2  0 1  How bad is depression 4 3 4 4 2  intermdiat  Any chronic pain - if so where and how bad x x x x 2 Worsening memory loss       Simple office walk 185 feet x  3 laps goal with forehead probe 11/18/2021  04/21/2022  10/06/2022  12/15/2022 FENO 8ppbc  06/10/2023    O2 used ra ra ra ra  Number laps completed 3 3 2    Comments about pace Mod pace mod Sist stand x 10   Resting Pulse Ox/HR 100%% and 67/min 100% and 65 98% and HR 78   Final Pulse Ox/HR 98% and 107/min 99% and HR 98 87% and HR 91   Desaturated </= 88% no no yes   Desaturated <= 3% points no no yes   Got Tachycardic >/= 90/min yes yes yes   Symptoms at end of test None none 3 of 10 dyspnea   Miscellaneous comments x  [facility] refused o2 Rx      PFT     Latest Ref Rng & Units 06/10/2023    1:00 PM 02/08/2023   11:38 AM 12/15/2022     9:51 AM 04/21/2022    8:41 AM 12/22/2021   11:50 AM  PFT Results  FVC-Pre L 3.56  3.32  3.49  3.73  3.80   FVC-Predicted Pre % 97  86  91  96  98   Pre FEV1/FVC % % 81  79  81  83  83   FEV1-Pre L 2.90  2.62  2.81  3.11  3.13   FEV1-Predicted Pre % 110  95  102  112  112   DLCO uncorrected ml/min/mmHg 16.82  16.10  16.34  18.04  17.19   DLCO UNC% % 74  69  70  76  73   DLCO corrected ml/min/mmHg 16.82  16.10  16.34  18.04  17.19   DLCO COR %Predicted % 74  69  70  76  73   DLVA Predicted % 79  77  78  80  81   TLC L     5.98   TLC % Predicted %     89   RV % Predicted %     91        LAB RESULTS last 96 hours No results found.       has a past medical history of Anxiety, Arthritis, Headache(784.0), History of colonic polyps, Hypertension, Peripheral vascular disease (HCC), Pneumonia, Pneumothorax, Pulmonary fibrosis (HCC), Short-term memory loss, Shortness of breath, and Stroke (HCC).   reports that he quit smoking about 27 years ago. His smoking use included cigarettes and cigars. He started smoking about 59 years ago. He has a 48 pack-year smoking history. He has quit using smokeless tobacco.  His smokeless tobacco use included chew.  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/23/2011  Procedure: TOTAL KNEE ARTHROPLASTY;  Surgeon: Loreta Ave, MD;  Location: [facility] OR;  Service: Orthopedics;  Laterality: Left;   TOTAL KNEE ARTHROPLASTY Right 07/12/2013   Procedure: TOTAL KNEE ARTHROPLASTY;  Surgeon: Loreta Ave, MD;  Location: [facility] OR;  Service: Orthopedics;  Laterality: Right;    Allergies  Allergen Reactions   Lisinopril Cough    Cephalosporins Hives   Tylenol [Acetaminophen] Other (See Comments)    "I felt weird"    Immunization History  Administered Date(s) Administered   DTaP 09/01/2013   Fluad Quad(high Dose 65+) 05/07/2020   Influenza, High Dose Seasonal PF 04/27/2016, 04/13/2017, 04/03/2022   Influenza-Unspecified 08/10/2012, 03/14/2014   Moderna Covid-19 Fall Seasonal Vaccine 57yrs & older 01/01/2023, 05/14/2023   Pfizer Covid-19 Vaccine Bivalent Booster 52yrs & up 01/01/2022   Pneumococcal Conjugate-13 07/20/2014   Pneumococcal-Unspecified 03/22/2012   Rsv, Bivalent, Protein Subunit Rsvpref,pf Verdis Frederickson) 05/13/2022   Td 12/26/2021   Tdap 03/22/2012   Zoster Recombinant(Shingrix) 12/29/2017, 03/17/2018    Family History  Problem Relation Age of Onset   Heart disease Mother    Heart disease Father    Heart disease Brother    Colon cancer Neg Hx    Colon polyps Neg Hx    Esophageal cancer Neg Hx    Pancreatic cancer Neg Hx    Stomach cancer Neg Hx      Current Outpatient Medications:    albuterol (VENTOLIN HFA) 108 (90 Base) MCG/ACT inhaler, Inhale 2 puffs into the lungs every 6 (six) hours as needed for wheezing or shortness of breath., Disp: 25.5 g, Rfl: 3   amLODipine (NORVASC) 5 MG tablet, Take 5 mg by mouth at bedtime., Disp: , Rfl:    aspirin EC 81 MG tablet, Take 81 mg by mouth at bedtime., Disp: , Rfl:    benzoyl peroxide 5 % external liquid, Apply 1 Application topically at bedtime., Disp: , Rfl:    carboxymethylcellulose (REFRESH PLUS) 0.5 % SOLN, Place 1 drop into both eyes 4 (four) times daily as needed (for dryness)., Disp: , Rfl:    diclofenac Sodium (VOLTAREN) 1 % GEL, Apply 2 g topically 4 (four) times daily as needed (joint pain)., Disp: , Rfl:    divalproex (DEPAKOTE) 250 MG DR tablet, Take 750 mg by mouth at bedtime., Disp: , Rfl:    Docusate Sodium (DSS) 100 MG CAPS, Take 100 mg by mouth in the morning and at bedtime., Disp: , Rfl:    donepezil (ARICEPT) 10 MG tablet, Take 10  mg by mouth at bedtime., Disp: , Rfl:    famotidine (PEPCID) 20 MG tablet, Take 1 tablet (20 mg total) by mouth at bedtime., Disp: 30 tablet, Rfl: 5   famotidine (PEPCID) 40 MG tablet, Take 40 mg by mouth at bedtime., Disp: , Rfl:    FLUoxetine (PROZAC) 10 MG capsule, Take 30 mg by mouth at bedtime., Disp: , Rfl:    levETIRAcetam (KEPPRA) 500 MG tablet, Take 1 tablet (500 mg total) by mouth 2 (two) times daily., Disp: 60 tablet, Rfl: 2   memantine (NAMENDA) 10 MG tablet, Take 20 mg by mouth at bedtime., Disp: , Rfl:    Multiple Vitamins-Minerals (CENTRUM SILVER 50+MEN PO), Take 1 tablet by mouth at bedtime., Disp: , Rfl:    omeprazole (PRILOSEC) 40 MG capsule, Take 1 capsule (40 mg total) by mouth daily. To be taken 30 minutes before breakfast, Disp: 90 capsule, Rfl: 1   Pirfenidone (ESBRIET) 267 MG TABS, Take 3 tablets (801 mg total) by mouth  3 (three) times daily with meals., Disp: 810 tablet, Rfl: 1   rosuvastatin (CRESTOR) 40 MG tablet, Take 20 mg by mouth at bedtime., Disp: , Rfl:       Objective:   Vitals:   09/10/23 0844  BP: 106/62  Pulse: 64  SpO2: 95%    Estimated body mass index is 32.05 kg/m as calculated from the following:   Height as of 08/11/23: 5\' 8"  (1.727 m).   Weight as of 08/11/23: 210 lb 12.8 oz (95.6 kg).  @WEIGHTCHANGE @  There were no vitals filed for this visit.   Physical Exam   General: No distress. SITTIN IN WHEEL CHAIR O2 at rest: nno Cane present: no Sitting in wheel chair: YES . POSEY Frail: no Obese: no Neuro:Marland Kitchen GCS 15. Speech normal . FORGETFU Psych: Pleasant Resp:  Barrel Chest - no.  Wheeze - no, Crackles - no, No overt respiratory distress CVS: Normal heart sounds. Murmurs - no Ext: Stigmata of Connective Tissue Disease - no HEENT: Normal upper airway. PEERL +. No post nasal drip        Assessment:       ICD-10-CM   1. IPF (idiopathic pulmonary fibrosis) (HCC)  J84.112     2. Agent orange exposure  Z77.098          Plan:      Patient Instructions     ICD-10-CM   1. IPF (idiopathic pulmonary fibrosis) (HCC)  J84.112     2. Agent orange exposure  Z77.098     3. DOE (dyspnea on exertion)  R06.09     4. Other fatigue  R53.83        #IPF  - Clinically stable on -Overall tolerating pirfenidone well  but neurologic and dementia issues and frailty means this medicine is probably no longer beneficial  Plan - STOP PIRFENIDONE aka ESBRIET Cotninue suppprotive care  - refer cardiology  #Follow-up - 12 weeks with Dr. Marchelle Gearing or APP; 15 min visit   = Return sooner if needed   FOLLOWUP Return in about 3 months (around 12/10/2023) for with Dr Marchelle Gearing, with any of the APPS, 15 min visit, Face to Face OR Video Visit.    SIGNATURE    Dr. Kalman Shan, M.D., F.C.C.P,  Pulmonary and Critical Care Medicine Staff Physician, [facility] Director - Interstitial Lung Disease  Program  Pulmonary Fibrosis [facility] at [facility] [place], [place], [address]  Pager: [phone], If no answer or between  15:00h - 7:00h: call [phone] Telephone: [phone]  9:08 AM 09/10/2023

## 2023-09-10 NOTE — Patient Instructions (Addendum)
 ICD-10-CM   1. IPF (idiopathic pulmonary fibrosis) (HCC)  J84.112     2. Agent orange exposure  Z77.098     3. DOE (dyspnea on exertion)  R06.09     4. Other fatigue  R53.83        #IPF  - Clinically stable on -Overall tolerating pirfenidone well  but neurologic and dementia issues and frailty means this medicine is probably no longer beneficial  Plan - STOP PIRFENIDONE aka ESBRIET Cotninue suppprotive care  - refer cardiology  #Follow-up - 12 weeks with Dr. Marchelle Gearing or APP; 15 min visit   = Return sooner if needed

## 2023-10-11 ENCOUNTER — Ambulatory Visit: Attending: Cardiovascular Disease

## 2023-10-11 DIAGNOSIS — I639 Cerebral infarction, unspecified: Secondary | ICD-10-CM

## 2023-10-12 ENCOUNTER — Ambulatory Visit: Payer: Self-pay | Admitting: Cardiovascular Disease

## 2023-10-12 DIAGNOSIS — I639 Cerebral infarction, unspecified: Secondary | ICD-10-CM

## 2023-10-19 ENCOUNTER — Encounter: Payer: Self-pay | Admitting: Internal Medicine

## 2023-10-19 NOTE — Telephone Encounter (Signed)
**Note De-identified  [place] Obfuscation** Please advise 

## 2023-10-21 NOTE — Telephone Encounter (Signed)
 We can try a  course of prednisone  for 8 days. If they are inerested pleas send to pharmacy. Keep next scheduled visit as is. Cough - hard to tell what is causing it but worth trying pred o see if there is some inflammation causing it  Take prednisone  40 mg daily x 2 days, then 20mg  daily x 2 days, then 10mg  daily x 2 days, then 5mg  daily x 2 days and stop

## 2023-10-22 ENCOUNTER — Other Ambulatory Visit: Payer: Self-pay

## 2023-10-22 MED ORDER — PREDNISONE 10 MG PO TABS: ORAL_TABLET | ORAL | 0 refills | Status: AC

## 2023-10-22 NOTE — Progress Notes (Signed)
 Prednisone taper sent.

## 2023-11-15 ENCOUNTER — Emergency Department (HOSPITAL_COMMUNITY)

## 2023-11-15 ENCOUNTER — Encounter (HOSPITAL_COMMUNITY): Payer: Self-pay

## 2023-11-15 ENCOUNTER — Emergency Department (HOSPITAL_COMMUNITY)
Admission: EM | Admit: 2023-11-15 | Discharge: 2023-11-15 | Disposition: A | Discharge status: Home / Self Care | Attending: Emergency Medicine | Admitting: Emergency Medicine

## 2023-11-15 ENCOUNTER — Other Ambulatory Visit: Payer: Self-pay

## 2023-11-15 DIAGNOSIS — Z79899 Other long term (current) drug therapy: Secondary | ICD-10-CM | POA: Insufficient documentation

## 2023-11-15 DIAGNOSIS — J849 Interstitial pulmonary disease, unspecified: Secondary | ICD-10-CM | POA: Diagnosis not present

## 2023-11-15 DIAGNOSIS — I10 Essential (primary) hypertension: Secondary | ICD-10-CM | POA: Insufficient documentation

## 2023-11-15 DIAGNOSIS — Z7982 Long term (current) use of aspirin: Secondary | ICD-10-CM | POA: Diagnosis not present

## 2023-11-15 DIAGNOSIS — J929 Pleural plaque without asbestos: Secondary | ICD-10-CM | POA: Diagnosis not present

## 2023-11-15 DIAGNOSIS — R55 Syncope and collapse: Secondary | ICD-10-CM | POA: Insufficient documentation

## 2023-11-15 DIAGNOSIS — S2241XA Multiple fractures of ribs, right side, initial encounter for closed fracture: Secondary | ICD-10-CM | POA: Diagnosis not present

## 2023-11-15 LAB — CBC
HCT: 41.1 % (ref 39.0–52.0)
Hemoglobin: 13.9 g/dL (ref 13.0–17.0)
MCH: 33.2 pg (ref 26.0–34.0)
MCHC: 33.8 g/dL (ref 30.0–36.0)
MCV: 98.1 fL (ref 80.0–100.0)
Platelets: 129 10*3/uL — ABNORMAL LOW (ref 150–400)
RBC: 4.19 MIL/uL — ABNORMAL LOW (ref 4.22–5.81)
RDW: 12.1 % (ref 11.5–15.5)
WBC: 7.9 10*3/uL (ref 4.0–10.5)
nRBC: 0 % (ref 0.0–0.2)

## 2023-11-15 LAB — BASIC METABOLIC PANEL WITH GFR
Anion gap: 9 (ref 5–15)
BUN: 17 mg/dL (ref 8–23)
CO2: 25 mmol/L (ref 22–32)
Calcium: 8.7 mg/dL — ABNORMAL LOW (ref 8.9–10.3)
Chloride: 99 mmol/L (ref 98–111)
Creatinine, Ser: 1.1 mg/dL (ref 0.61–1.24)
GFR, Estimated: >60 mL/min (ref >60)
Glucose, Bld: 95 mg/dL (ref 70–99)
Potassium: 3.9 mmol/L (ref 3.5–5.1)
Sodium: 133 mmol/L — ABNORMAL LOW (ref 135–145)

## 2023-11-15 LAB — URINALYSIS, ROUTINE W REFLEX MICROSCOPIC
Bilirubin Urine: NEGATIVE
Glucose, UA: NEGATIVE mg/dL
Hgb urine dipstick: NEGATIVE
Ketones, ur: NEGATIVE mg/dL
Leukocytes,Ua: NEGATIVE
Nitrite: NEGATIVE
Protein, ur: NEGATIVE mg/dL
Specific Gravity, Urine: 1.008 (ref 1.005–1.030)
pH: 7 (ref 5.0–8.0)

## 2023-11-15 MED ORDER — SODIUM CHLORIDE 0.9 % IV BOLUS
1000.0000 mL | Freq: Once | INTRAVENOUS | Status: AC
  Administered 2023-11-15: 1000 mL via INTRAVENOUS

## 2023-11-15 NOTE — ED Triage Notes (Signed)
 Pt was in the shower and had a near syncopal episode witnessed by home health. Bp was 98 systolic. Axox4. HR in 50's. EMS gave 500 cc of NS

## 2023-11-15 NOTE — ED Notes (Signed)
 CCMD is monitoring pt

## 2023-11-15 NOTE — ED Notes (Signed)
 Patient transported to CT

## 2023-11-15 NOTE — Discharge Instructions (Signed)
 Please make sure you are drinking plenty of clear liquids and follow-up with your doctor this week.  ER for severe worsening symptoms

## 2023-11-15 NOTE — ED Provider Notes (Signed)
 I have evaluated the patient at the bedside, he is awake alert and has no complaints at this time.  Vital signs are normal, IV fluids given, I discussed all of the results with the patient and his family member at the bedside and they are comfortable with the plan to follow-up in the outpatient setting.   Early Glisson, MD 11/15/23 [phone]

## 2023-11-15 NOTE — ED Provider Notes (Signed)
 [place] EMERGENCY DEPARTMENT AT [facility] Provider Note   CSN: [id number] Arrival date & time: 11/15/23  1209     Patient presents with: Near Syncope   Luke Golden is a 78 y.o. male.   Pt is a 78 yo male with pmhx significant for PVD, arthritis, HTN, anxiety, PTSD, pulmonary fibrosis, CVA, and dementia.  Pt's wife gives most of the hx.  She said he had a syncopal event while in the shower.  He was with his aide, so he did not hurt himself.  The same thing happened a few weeks ago when he was in a hot shower.  Pt said he remember feeling dizzy, but no cp or headache.         Prior to Admission medications   Medication Sig Start Date End Date Taking? Authorizing Provider  albuterol  (VENTOLIN  HFA) 108 (90 Base) MCG/ACT inhaler Inhale 2 puffs into the lungs every 6 (six) hours as needed for wheezing or shortness of breath. 04/02/23   Maire Scot, MD  amLODipine  (NORVASC ) 5 MG tablet Take 5 mg by mouth at bedtime. 09/01/22   [provider]  aspirin  EC 81 MG tablet Take 81 mg by mouth at bedtime.    [provider]  benzoyl peroxide 5 % external liquid Apply 1 Application topically at bedtime. 05/14/23   [provider]  carboxymethylcellulose (REFRESH PLUS) 0.5 % SOLN Place 1 drop into both eyes 4 (four) times daily as needed (for dryness).    [provider]  diclofenac Sodium (VOLTAREN) 1 % GEL Apply 2 g topically 4 (four) times daily as needed (joint pain). 05/14/23   [provider]  divalproex  (DEPAKOTE ) 250 MG DR tablet Take 750 mg by mouth at bedtime. 10/15/22   [provider]  Docusate Sodium  (DSS) 100 MG CAPS Take 100 mg by mouth in the morning and at bedtime. 04/20/23   [provider]  donepezil  (ARICEPT ) 10 MG tablet Take 10 mg by mouth at bedtime. 07/27/23   [provider]  famotidine  (PEPCID ) 20 MG tablet Take 1 tablet (20 mg total) by mouth at bedtime. 06/02/22   Cobb, Mariah Shines, NP   famotidine  (PEPCID ) 40 MG tablet Take 40 mg by mouth at bedtime.    [provider]  FLUoxetine  (PROZAC ) 10 MG capsule Take 30 mg by mouth at bedtime. 06/17/23   [provider]  levETIRAcetam  (KEPPRA ) 500 MG tablet Take 1 tablet (500 mg total) by mouth 2 (two) times daily. 08/24/23 09/23/23  Ninetta Basket, MD  memantine  (NAMENDA ) 10 MG tablet Take 20 mg by mouth at bedtime. 09/18/21   [provider]  Multiple Vitamins-Minerals (CENTRUM SILVER 50+MEN PO) Take 1 tablet by mouth at bedtime.    [provider]  omeprazole  (PRILOSEC) 40 MG capsule Take 1 capsule (40 mg total) by mouth daily. To be taken 30 minutes before breakfast 09/25/22   Kennedy-Smith, Colleen M, NP  Pirfenidone  (ESBRIET ) 267 MG TABS Take 3 tablets (801 mg total) by mouth 3 (three) times daily with meals. 04/06/23   Maire Scot, MD  rosuvastatin  (CRESTOR ) 40 MG tablet Take 20 mg by mouth at bedtime.    [provider]    Allergies: Lisinopril , Cephalosporins, and Tylenol  [acetaminophen ]    Review of Systems  Neurological:  Positive for syncope.  All other systems reviewed and are negative.   Updated Vital Signs BP (!) 139/52   Pulse (!) 56   Temp (!) 97.5 F (36.4 C)  Resp 17   Ht 5' 8 (1.727 m)   Wt 105.2 kg   SpO2 100%   BMI 35.28 kg/m   Physical Exam Vitals and nursing note reviewed.  Constitutional:      Appearance: Normal appearance.  HENT:     Head: Normocephalic and atraumatic.     Right Ear: External ear normal.     Left Ear: External ear normal.     Nose: Nose normal.     Mouth/Throat:     Mouth: Mucous membranes are moist.     Pharynx: Oropharynx is clear.   Eyes:     Extraocular Movements: Extraocular movements intact.     Conjunctiva/sclera: Conjunctivae normal.     Pupils: Pupils are equal, round, and reactive to light.    Cardiovascular:     Rate and Rhythm: Normal rate and regular rhythm.     Pulses: Normal pulses.     Heart  sounds: Normal heart sounds.  Pulmonary:     Effort: Pulmonary effort is normal.     Breath sounds: Normal breath sounds.  Abdominal:     General: Abdomen is flat.     Palpations: Abdomen is soft.   Musculoskeletal:        General: Normal range of motion.     Cervical back: Normal range of motion and neck supple.   Skin:    General: Skin is warm.     Capillary Refill: Capillary refill takes less than 2 seconds.   Neurological:     General: No focal deficit present.     Mental Status: He is alert and oriented to person, place, and time.   Psychiatric:        Mood and Affect: Mood normal.        Behavior: Behavior normal.     (all labs ordered are listed, but only abnormal results are displayed) Labs Reviewed  BASIC METABOLIC PANEL WITH GFR - Abnormal; Notable for the following components:      Result Value   Sodium 133 (*)    Calcium  8.7 (*)    All other components within normal limits  CBC - Abnormal; Notable for the following components:   RBC 4.19 (*)    Platelets 129 (*)    All other components within normal limits  URINALYSIS, ROUTINE W REFLEX MICROSCOPIC    EKG: EKG Interpretation Date/Time:  Monday November 15 2023 12:25:29 EDT Ventricular Rate:  55 PR Interval:  156 QRS Duration:  90 QT Interval:  440 QTC Calculation: 420 R Axis:   18  Text Interpretation: Sinus bradycardia Nonspecific ST abnormality Abnormal ECG When compared with ECG of 24-Aug-2023 14:06, PREVIOUS ECG IS PRESENT Confirmed by Abner Hoffman [phone]) on 11/15/2023 12:33:26 PM  Radiology: No results found.   Procedures   Medications Ordered in the ED  sodium chloride  0.9 % bolus 1,000 mL (1,000 mLs Intravenous New Bag/Given 11/15/23 1458)                                    Medical Decision Making Amount and/or Complexity of Data Reviewed Labs: ordered. Radiology: ordered.   This patient presents to the ED for concern of syncope, this involves an extensive number of treatment options,  and is a complaint that carries with it a high risk of complications and morbidity.  The differential diagnosis includes orthostasis, infection, anemia, electrolyte abn   Co morbidities that complicate the patient evaluation  PVD, arthritis, HTN, anxiety, PTSD, pulmonary fibrosis, CVA, and dementia   Additional history obtained:  Additional history obtained from epic chart review External records from outside source obtained and reviewed including wife   Lab Tests:  I Ordered, and personally interpreted labs.  The pertinent results include:  cbc nl, bmp nl   Imaging Studies ordered:  I ordered imaging studies including cxr and ct head  Pending at shift change   Cardiac Monitoring:  The patient was maintained on a cardiac monitor.  I personally viewed and interpreted the cardiac monitored which showed an underlying rhythm of: nsr   Medicines ordered and prescription drug management:  I ordered medication including ivfs  for sx  Reevaluation of the patient after these medicines showed that the patient improved I have reviewed the patients home medicines and have made adjustments as needed   Test Considered:  ct   Critical Interventions:  ivfs   Problem List / ED Course:  Syncope:  likely orthostatic due to hot shower.  CT pending at shift change.   Reevaluation:  After the interventions noted above, I reevaluated the patient and found that they have :improved   Social Determinants of Health:  Lives at home   Dispostion:  After consideration of the diagnostic results and the patients response to treatment, I feel that the patent would benefit from discharge with outpatient f/u.       Final diagnoses:  Syncope, unspecified syncope type    ED Discharge Orders     None          Sueellen Emery, MD 11/15/23 1553

## 2023-11-17 ENCOUNTER — Telehealth: Payer: Self-pay | Admitting: *Deleted

## 2023-11-17 DIAGNOSIS — J209 Acute bronchitis, unspecified: Secondary | ICD-10-CM

## 2023-11-17 NOTE — Telephone Encounter (Signed)
 Copied from CRM [id number]. Topic: Appointments - Scheduling Inquiry for Clinic >> Nov 16, 2023 12:03 PM [name] wrote: Reason for CRM: Patient was seen in the emergency room yesterday for a bad cough, falling, and a possible seizure. Patients wife would like to alert Dr.  Bertrum Brodie to the situation just in case he would like to see the patient sooner than his virtual appointment in July. Patient is current bed bound and wife will need to take the transportation bus as she can't help him up. Please call patients wife back and advise.   Called pt's spouse, Carmelina Chinchilla, ok per DPR. There was no answer- LMTCB.

## 2023-11-22 MED ORDER — PREDNISONE 10 MG PO TABS: ORAL_TABLET | ORAL | 0 refills | Status: DC

## 2023-11-22 NOTE — Telephone Encounter (Signed)
 Recommend  A) stop esbriet  if he has not already - and let us  see if diszziness and other symptms resolve  B) for cough  Please take prednisone  40 mg x1 day, then 30 mg x1 day, then 20 mg x1 day, then 10 mg x1 day, and then 5 mg x1 day and stop - sent to [facility]  If sputum colored - I need to send antibiotics  awell

## 2023-11-22 NOTE — Telephone Encounter (Signed)
 I called and spoke with the pt's spouse  She states he had another syncopal episode 11/15/23 and had to go to ED  Was no admitted  He has had increased cough with thick, clear sputum over the past few days  Has been wheezing off and on- minimal  Has not tried albuterol  but he has this and will do so if starts wheezing again  Denies any fever He is taking mucinex  for cough  Wife asking for something else he can take for cough since this isn't helping  Please advise, thanks!  Allergies  Allergen Reactions   Lisinopril  Cough   Cephalosporins Hives   Tylenol  [Acetaminophen ] Other (See Comments)    I felt weird

## 2023-11-24 NOTE — Telephone Encounter (Signed)
 Spoke to patient's wife, [name](DPR). She is aware of below message and voiced her understanding.  She stated that pat stopped Esbriet  at last OV.  Pt is not producing colored sputum. Sputum is clear. Nothing further needed at this time.

## 2023-11-25 ENCOUNTER — Encounter: Payer: Self-pay | Admitting: Internal Medicine

## 2023-11-25 NOTE — Telephone Encounter (Signed)
 Please advise if you would like patient to continue taking prednisone  or hold off

## 2023-12-02 NOTE — Telephone Encounter (Signed)
 MR- please respond to the pt mychart msg

## 2023-12-08 NOTE — Patient Instructions (Signed)
 ICD-10-CM   1. IPF (idiopathic pulmonary fibrosis) (HCC)  J84.112     2. Agent orange exposure  Z77.098     3. DOE (dyspnea on exertion)  R06.09     4. Other fatigue  R53.83        #IPF  - Clinically stable on -Overall tolerating pirfenidone well  but neurologic and dementia issues and frailty means this medicine is probably no longer beneficial  Plan - STOP PIRFENIDONE aka ESBRIET Cotninue suppprotive care  - refer cardiology  #Follow-up - 12 weeks with Dr. Marchelle Gearing or APP; 15 min visit   = Return sooner if needed

## 2023-12-08 NOTE — Telephone Encounter (Signed)
 Hard to sort on the phone. Needs to come and see APP for symptom support

## 2023-12-08 NOTE — Progress Notes (Unsigned)
 OV 11/18/2021  Subjective:  Patient ID: Luke Golden, male , DOB: [birth year] , age 78 y.o. , MRN: [id number] , ADDRESS: [address] [place] [address] PCP [facility] Patient Care Team: [facility] as PCP - General  This Provider for this visit: Treatment Team:  Attending Provider: Geronimo Amel, MD PCP Dr Delana at [facility]   11/18/2021 -   Chief Complaint  Patient presents with   Consult    Pt recently had a PFT performed and is here today to go over the results. Pt does have complaints of an occasional cough and SOB that is worse with exertion.     HPI Luke Golden 78 y.o. -referred by the [facility].  History is obtained from talking to him and his wife.  They live in [place].  Wife works as a Psychologist, occupational at [employer].  He is asked weight no more veteran.  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  Denies any agent orange exposure.  Former smoker.  According to the wife for the last few years he has had exertional fatigue and also shortness of breath.  But insidiously and gradually is gotten worse.  Sometime towards the end of 2022 at the beginning of 2023 he did have a viral syndrome [facility].  Then in the visit with the [facility] in April 2023 it appears that he started complaining of exertional fatigue and shortness of breath.  Was fairly significant.  They did a chest x-ray on him on September 12, 2021.  Only have the report.  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  He has old chest x-ray here that I personally visualized shows some chronic atelectatic changes.  They did a pulmonary function test and it shows normal FEV1 FVC TLC but isolated reduction in diffusion capacity to 64.2% overall wife says that he is got a lot of fatigue dyspnea lower stamina.  Things are getting worse.  His past medical history is significant for  short-term memory loss, left femoral DVT in 2000.  Denies any substance abuse or tobacco abuse alcohol use in the last 20 years.  He has obstructive sleep apnea moderate but he has quit using his CPAP in the last week.  This is because of facial fit issues.  He also has back pain PTSD coronary artery disease hyperlipidemia acid reflux hypertension.   According to him he had a CT scan of the chest but we could not find evidence for this.  According to my CMA has had a echocardiogram at the [facility] but I could not find evidence for this.  He had a good walking desaturation test here.     CT Chest data - Nov 2015  CLINICAL DATA:  Chest pain.   EXAM: PORTABLE CHEST - 1 VIEW   COMPARISON:  July 05, 2013.   FINDINGS: Stable cardiomediastinal silhouette. No pneumothorax or pleural effusion is noted. Minimal bibasilar subsegmental atelectasis is noted. Old right rib fractures are noted.   IMPRESSION: Minimal bibasilar subsegmental atelectasis.     Electronically Signed   By: Lynwood Seip M.D.   On: 04/23/2014 12:29  No results found.  Nuclear Medicine Stress tst 2017  IMPRESSION: 1. No scintigraphic evidence of prior infarction or pharmacologically induced ischemia.   2. Normal left ventricular wall motion.   3. Left ventricular ejection  fraction 59%   4. Non invasive risk stratification*: Low   *2012 Appropriate Use Criteria for Coronary Revascularization Focused Update: J Am Coll Cardiol. 2012;59(9):857-881. http://content.dementiazones.com.aspx?articleid=[email or web address]     Electronically Signed   By: Norleen Roulette M.D.   On: 01/28/2016 13:07       12/22/2021: Today-follow-up Patient presents today for follow-up after undergoing high-resolution CT scan, ILD panel and pulmonary function testing.  Pulmonary function testing was completed today and showed normal spirometry and lung volumes.  He did have a mild reduction in DLCO at 73%.  He was unable to complete  postbronchodilator spirometry.  ILD panel was negative aside from low positive ANA and QuantiFERON gold.  No active evidence of TB on recent imaging.  He has never been told in the past that he has TB or has been exposed to it.  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  Today, he reports feeling unchanged when compared to when he was here last.  Breathing is overall stable.  He does get winded, primarily with climbing and long distances.  He also has ongoing issues with fatigue; however, he had stopped using his CPAP, which could be a large contributing factor.  He has since restarted and feels like he has gotten more restful sleep and wakes up feeling better in the morning.  He denies any recent fevers, night sweats, hemoptysis, weight loss, anorexia, lower extremity edema, orthopnea. Denies joint pain, dry mouth or eyes. He has yet to complete ONO and echocardiogram.  Never tried any inhalers for his shortness of breath.   IMPRESSION: 1. Findings are indicative of interstitial lung disease, clearly progressive compared to remote prior study from 2012, with a spectrum of findings considered probable usual interstitial pneumonia (UIP) per current ATS guidelines. Repeat high-resolution chest CT is suggested in 12 months to assess for temporal changes in the appearance of the lung parenchyma. 2. Aortic atherosclerosis, in addition to left main and three-vessel coronary artery disease. Assessment for potential risk factor modification, dietary therapy or pharmacologic therapy may be warranted, if clinically indicated. 3. There are calcifications of the aortic valve and mitral annulus. Echocardiographic correlation for evaluation of potential valvular dysfunction may be warranted if clinically indicated. 4. Cholelithiasis.   Aortic Atherosclerosis (ICD10-I70.0).     Electronically Signed   By: Toribio Aye M.D.    On: 12/09/2021 09:38  OV 01/27/2022  Subjective:  Patient ID: Luke Golden, male , DOB: May 05, 1946 , age 78 y.o. , MRN: [id number] , ADDRESS: [address] [place] [address] PCP [facility] Patient Care Team: [facility] as PCP - General  This Provider for this visit: Treatment Team:  Attending Provider: Geronimo Amel, MD    01/27/2022 -   Chief Complaint  Patient presents with   Follow-up    Pt states he has been doing okay since last visit. Pt is still SOB but states it is about the same since last visit.    HPI Luke Golden 78 y.o. -returns for follow-up.  I last saw him a few months ago and then he saw a Publishing rights manager.  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  INH and rifampin  have been commenced.  After that he called our office or at least the wife called and reported that he  was having shakes and trembles and frequent falls.  Also complaining of dyspnea on exertion.  Neither of them are able to tell me that if the symptoms are worse than before or of the onset of it started after the starting of INH and rifampin .  Definitely the call happened for the shakes and falls and the tremors after he got started on INH and rifampin .  But they are unable to explicitly tell me the duration.  In addition he was given ILD question in July 2023 by the nurse practitioner.  He was supposed to bring it with him today but I do not have it.  The wife believes that she fill it up and brought it and gave it to us .  He does admit to previous street drug use.  He also admits to previous heavy smoking.  He also admits to agent orange exposure for a year while he was in [place].  Of note the wife works at [employer].  She is now needing to care for him.  [facility] her to care for him.  But she needs a letter of support.  I did indicate to her about the FMLA  option.  But she does want a caregiver support letter.      OV 04/21/2022  Subjective:  Patient ID: Luke Golden, male , DOB: [birth year] , age 46 y.o. , MRN: [id number] , ADDRESS: [address] [place] [address] PCP [facility] Patient Care Team: [facility] as PCP - General  This Provider for this visit: Treatment Team:  Attending Provider: Geronimo Amel, MD    04/21/2022 -   Chief Complaint  Patient presents with   Follow-up    PFT performed today.  Pt finished taking the TB medication about 1 month ago. Still coughing up phlegm in the mornings that is clear in color. States that he does have complaints of SOB.  HPI Luke Golden 78 y.o. -returns for follow-up.  He is here to discuss his pulmonary function test and symptoms symptoms are stable.  Pulmonary function test is stable.  He has completed his INH treatment.  Gave him and his wife diagnosis of IPF based on above criteria.  This was reminded and read discussion because the short-term memory loss.  In the interim he did get COVID-19 in September 2023 and was on Paxlovid.  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  There are no new issues.   We discussed several aspects of his care plan.  We discussed clinical trials but given his short-term memory loss and inability to consent and his lack of interest in being a [place] pig they are not interested in that at this point in time.  They might revisit this in the future.  Discussed pirfenidone  and nintedanib.  He does not want to do nintedanib because of the diarrhea side effect.  He feels he is overweight and would rather go with pirfenidone  which would give him some nausea and anorexia and possible weight loss. Discussed the fact is 3 pills 3 times daily and just apply sunscreen and he is to space this out 5 to 6 hours apart.  His wife made notes about all this.  Liver function test monitoring is required.  They decided to go with  pirfenidone .   06/02/2022: Today - follow up - NP Patient presents today for follow up with his wife. He started Esbriet  around 2 weeks ago. He is getting ready to start his  third week and maintenance dose of 3 pills, three times a day. He has tolerated it well so far. His wife does feel like he has been coughing more at night since he started it but otherwise, his symptoms are stable. Cough is only productive in AM with clear sputum. He does feel like his reflux is a little worse than it has been; otherwise, no GI issues. No fevers, chills, night sweats, hemoptysis. He knows he needs to be more active, which he is going to work on.    OV 08/18/2022  Subjective:  Patient ID: Luke Golden, male , DOB: [birth year] , age 32 y.o. , MRN: [id number] , ADDRESS: [address] [place] [address] PCP [facility] Patient Care Team: [facility] as PCP - General  This Provider for this visit: Treatment Team:  Attending Provider: Geronimo Amel, MD    08/18/2022 -   Chief Complaint  Patient presents with   Follow-up    Pft review, cough all the times started in ( September ) does use robitussin. Post covid.      HPI Luke Golden 78 y.o. -returns for follow-up.  He presents with his wife [name].  His wife [name] is on a wheelchair because she fractured her feet in February 2024.  Normally she is the caregiver driving him but this time he drove her.  He tells me that over a month ago he stopped taking pirfenidone  because he was tired and dizzy and constipated and occasional nausea.  He did not inform his wife about it.  Instead he was giving her the empty pillbox.  She only knew about it today when he revealed this information to me.  But from a shortness of breath standpoint he feels stable.  Shortness of breath score is stable.  However he does have significant amount of cough.  Robitussin controls it.  Wife does not want to pay for over-the-counter Robitussin but is  asking for a prescription so they can get the prescription from the [facility] at low cost/no cost.  However review of his medications reveals that he is taking lisinopril .  He is willing to stop this.  He did agree that if stopping this and switching over to another medication of my recommendation does not help then he is willing to try other medications or further testing.   He was supposed to have interim pulmonary function test.  Apparently was done at the [facility] but we have not obtained it.  CMA reports it is very difficult to get pulmonary function test from the [facility].  OV 10/06/2022  Subjective:  Patient ID: Luke Golden, male , DOB: 1945-11-12 , age 56 y.o. , MRN: [id number] , ADDRESS: [address] [place] [address] PCP [facility] Patient Care Team: [facility] as PCP - General  This Provider for this visit: Treatment Team:  Attending Provider: Geronimo Amel, MD     10/06/2022 -   Chief Complaint  Patient presents with   Follow-up    F/up on IPF     HPI Luke Golden 78 y.o. -returns for follow-up.  Presents with his wife [name].  After the last visit they complained about pirfenidone  side effect so we switched him to nintedanib but they went home and spoke to the pharmacist and apparently the pharmacist said that he was on borrowed time and therefore he restarted his pirfenidone .  Today says he is tolerating it well.  Although the wife  and he said that he is sleeping a lot and he gets up late.  It is unclear if this got worse after the pirfenidone  start.  It seems like it has been going on for quite a while.  Last visit I also told him to stop his lisinopril  but his cough continues.  It is quite severe.  He takes Robitussin which controls it.  It is unclear if the cough is improved after stopping lisinopril  the wife does say that she does not hear him cough is much but it could be because of Robitussin.  Shortness of breath appears to be the  same but the main complaint is that there is a lot of fatigue and sleepiness.  He is not using his CPAP.  I did a sit/stand test on him 10 times.  He did desaturate to 87/86%.  And he recovered with rest.  He did get a little dyspneic.  He does not have portable oxygen he does not have night oxygen.    OV 12/15/2022  Subjective:  Patient ID: Luke Golden, male , DOB: 1945-06-04 , age 23 y.o. , MRN: [id number] , ADDRESS: [address] [place] [address] PCP [facility] Patient Care Team: [facility] as PCP - General  This Provider for this visit: Treatment Team:  Attending Provider: Geronimo Amel, MD   12/15/2022 -   Chief Complaint  Patient presents with   Follow-up    Pft, ct f/u,      HPI Luke Golden 78 y.o. -returns for follow-up.  Presents with his wife.  Wife is independent historian in fact most of the historian.   IPF: Dyspnea stable.  Pulmonary function test stable.  Did CT scan of the chest personally visualized and agree.  IPF itself is stable.  He has been on pirfenidone  for over 6 months at this point.  He and his wife are pleased to hear this  High risk prescription with pirfenidone : Requires intensive therapeutic monitoring because of significant GI side effects and drug-induced liver injury.  He will have liver test today.  Of note wife is concerned that office delayed in getting the approval from the [facility].  The last prescription for the pirfenidone  he has is today.  After this he is getting a overnight shipment.  I have sent a message to the pharmacy team to see if he can do any improvement in process   Cough: Continues to be problematic.  Cough is severe despite stopping lisinopril  last visit.  Allergy panel was negative.  But his blood eosinophils are high although his nitric oxide  test today is normal.  There is some mild emphysema.  We talked about empirically covering this with inhaled corticosteroid/long-acting  beta agonist.  He is open to this idea.  I communicated this with the pharmacy team because they have to get approval from the [facility].  If this does not work we could try gabapentin or Stiolto.  Last resort would be chronic prednisone .  Biologic against eosinophils would be an option but would be very tough to get approval   Dyspnea: With exercise hypoxemia this is stable.  At last visit he had excess hypoxemia with sit/stand test but they could not reproduce it at the [facility] and the [facility] refused to give him oxygen.  Will get him to pulmonary rehabilitation.  Even this the approval from the [facility] has been delayed but if he can do this then we will test his exercise hypoxemia test  at the rehab and we can reassess his oxygen need at that time  Fatigue: This continues to be problematic I do not think pirfenidone  is made this worse.  His fatigue scores are significantly high.  Will see what happens with pulmonary rehabilitation.  CT Chest data from date: 12/14/22  - personally visualized and independently interpreted : yes - my findings are: same as below   OV 06/10/2023  Subjective:  Patient ID: Luke Golden, male , DOB: 19-Jan-1946 , age 58 y.o. , MRN: [id number] , ADDRESS: [address] [place] [address] PCP [facility] Patient Care Team: [facility] as PCP - General  This Provider for this visit: Treatment Team:  Attending Provider: Geronimo Amel, MD    06/10/2023 -   Chief Complaint  Patient presents with   Follow-up    Pt states cough has calmed down, still fatigue. Breathing is okay, using esbriet  and inhalers prn.      HPI Luke Golden 78 y.o. -presents for follow-up.  Presents with his wife.  Wife is the main historian.  She states that his dementia is getting worse.  He has intermittent episodes of complete forgetfulness and lucidity.  He also admits that he is not able to remember many things.  In fact his dementia has been so bad that he was not  able to complete pulmonary rehabilitation because he was forgetting instructions.  He wants to exercise on his own but his wife is worried that he spending all his day just sedentary.  She does not think he has a memory capacity to exercise on his own.  In addition they both are reporting constant and severe fatigue.  Fatigue is rated as a level 3 out of 5 on the symptom score sheet by the wife but I believe it is more severe than that.  He did trial stopping pirfenidone  and his wife says that stopping pirfenidone  did not make any difference with the fatigue.  He did have an echocardiogram and a BNP.  BNP was normal echocardiogram had a low normal ejection fraction.  I did explain to him and his wife that that ejection fraction should not make him so fatigued.  Nevertheless the interested in a cardiology opinion and will make a referral.  [facility] and he is wife tells me that we need to get the [facility] approval.  In terms of his chronic cough this is better.  Currently is tolerating his pirfenidone  at full dose well.  Of note wife once images hardcopy.  I did tell the CMA to facilitate and advised him on the paperwork to get this done.  She wants to take this to the [facility].     OV 09/10/2023  Subjective:  Patient ID: Luke Golden, male , DOB: 1946/01/16 , age 81 y.o. , MRN: [id number] , ADDRESS: [address] [place] [address] PCP [facility] Patient Care Team: [facility] as PCP - General Croitoru, Jerel, MD as PCP - Cardiology (Cardiology)  This Provider for this visit: Treatment Team:  Attending Provider: Geronimo Amel, MD    09/10/2023 -   Chief Complaint  Patient presents with   Hospitalization Follow-up    He states that his breathing is doing fine. He c/o low energy and runny nose.     Follow-up  -  idiopathic pulmonary fibrosis [IPF-diagnosis given December 22, 2021 by nurse practitioner  -Male gender, age greater than 33,  remote  heavy smoking, agent orange exposure, probable UIP by CT chest with progression and negative serology.   - Esbrrit start nov/dec 2023 -> stopped feb 2024: estarted pirfenidone  08/18/2022   -QuantiFERON gold positive.  Seen Dr. Elaine 01/01/2022 and started on INH and rifampin  x77-month  -Short-term memory loss and wife with significant caregiver burden  -Getting worse as of January 2025.  -Sleep apnea does not use CPAP  -Severe baseline chronic cough.   - Stop lisinopril  March 2024. - blood eos high 400 cells/mm 10/06/22; negative IgE and RAST allergy panel - normal feno 12/15/2022  - fibrois and some emphyseya on CT    -High risk prescription pirfenidone  with need for intensive therapeutic monitoring  - Esbriet /Pirfenidone  requires intensive drug monitoring due to high concerns for Adverse effects of , including  Drug Induced Liver Injury, significant GI side effects that include but not limited to Diarrhea, Nausea, Vomiting,  and other system side effects that include Fatigue, headaches, weight loss and other side effects such as skin rash. These will be monitored with  blood work such as LFT initially once a month for 6 months and then quarterly  HPI Luke Golden 77 y.o. -returns for follow-up.  Presents with his wife [name].  Since the last visit he is deteriorated physically and neurologically.  There was no ER visit with seizures.  He had MRI of the brain.  According to the wife his dementia is worse.  He is on antiepileptics.  He has had 5 mini strokes.  He has had intermittent incontinence.  He is on near-total care.  His ECOG is 3.  He spends a day watching TV.  He needs help just going to the bathroom and changing clothes but he can eat on his own.  There is a home health aide to give a shower.  They are trying to help him walk with a walker.  He is using a cane and a walker.  Shortness of breath itself is stable.  Does a lot of fatigue.  Right now is on polypharmacy with antiepileptics  and antifibrotic.  I did indicate to them I do not see the benefit of pirfenidone  anymore given his neurologic decline, polypharmacy and relative stability in the IPF and significant physical frailty    OV 12/08/2023  Subjective:  Patient ID: Luke Golden, male , DOB: 1945/06/24 , age 46 y.o. , MRN: [id number] , ADDRESS: [address] [place] [address] PCP [facility] Patient Care Team: [facility] as PCP - General Croitoru, Jerel, MD as PCP - Cardiology (Cardiology)  This Provider for this visit: Treatment Team:  Attending Provider: Geronimo Amel, MD    12/08/2023 -  No chief complaint on file.    HPI Luke Golden 78 y.o. -       SYMPTOM SCALE - ILD 01/27/2022 04/21/2022  08/18/2022 225# - esbreit stoppd x 1 oth 10/06/2022 Back on esbriet  since last OV 12/15/2022 Esbriet  [facility] refused o2 [facility] yewt to authorize rehab 06/10/2023 - wfie filled out   Current weight        O2 use ra ra  ra ra ra  Shortness of Breath 0 -> 5 scale with 5 being worst (score 6 If unable to do)       At rest 2 2 2 5 3 3   Simple tasks - showers, clothes change, eating, shaving 2 1 3 3 3 4   Household (dishes, doing bed, laundry) 3 2 4  3  3 4  Shopping 4.5 2 3 3 4 4   Walking level at own pace 4 1 4 3 4  3.5  Walking up Stairs 5 2 4 4 4 4   Total (30-36) Dyspnea Score 20.5 11 20 21 21 22   How bad is your cough? 2 2 4 4  - off ace inhibbit Feno 8 ppbc, cug 5, start breo empiric   How bad is your fatigue 4 intermitten 4 0 4 2  How bad is nausea 0 0 0 0 0 3  How bad is vomiting?  0 0 0 0 0 0  How bad is diarrhea? 0 0 0 0  0  How bad is anxiety? 2 3 4 2  0 1  How bad is depression 4 3 4 4 2  intermdiat  Any chronic pain - if so where and how bad x x x x 2 Worsening memory loss       Simple office walk 185 feet x  3 laps goal with forehead probe 11/18/2021  04/21/2022  10/06/2022  12/15/2022 FENO 8ppbc  06/10/2023    O2 used ra ra ra ra  Number laps  completed 3 3 2    Comments about pace Mod pace mod Sist stand x 10   Resting Pulse Ox/HR 100%% and 67/min 100% and 65 98% and HR 78   Final Pulse Ox/HR 98% and 107/min 99% and HR 98 87% and HR 91   Desaturated </= 88% no no yes   Desaturated <= 3% points no no yes   Got Tachycardic >/= 90/min yes yes yes   Symptoms at end of test None none 3 of 10 dyspnea   Miscellaneous comments x  [facility] refused o2 Rx      CT Chest data from date: ****  - personally visualized and independently interpreted : *** - my findings are: ***   PFT     Latest Ref Rng & Units 06/10/2023    1:00 PM 02/08/2023   11:38 AM 12/15/2022    9:51 AM 04/21/2022    8:41 AM 12/22/2021   11:50 AM  PFT Results  FVC-Pre L 3.56  3.32  3.49  3.73  3.80   FVC-Predicted Pre % 97  86  91  96  98   Pre FEV1/FVC % % 81  79  81  83  83   FEV1-Pre L 2.90  2.62  2.81  3.11  3.13   FEV1-Predicted Pre % 110  95  102  112  112   DLCO uncorrected ml/min/mmHg 16.82  16.10  16.34  18.04  17.19   DLCO UNC% % 74  69  70  76  73   DLCO corrected ml/min/mmHg 16.82  16.10  16.34  18.04  17.19   DLCO COR %Predicted % 74  69  70  76  73   DLVA Predicted % 79  77  78  80  81   TLC L     5.98   TLC % Predicted %     89   RV % Predicted %     91        LAB RESULTS last 96 hours No results found.       has a past medical history of Anxiety, Arthritis, Headache(784.0), History of colonic polyps, Hypertension, Peripheral vascular disease (HCC), Pneumonia, Pneumothorax, Pulmonary fibrosis (HCC), Short-term memory loss, Shortness of breath, and Stroke (HCC).   reports that he quit smoking about 27 years ago. His smoking use included cigarettes and  cigars. He started smoking about 59 years ago. He has a 48 pack-year smoking history. He has quit using smokeless tobacco.  His smokeless tobacco use included chew.  Past Surgical History:  Procedure Laterality Date   CARDIAC CATHETERIZATION     2012 - [place],medical therapy- as follow  up    COLONOSCOPY W/ BIOPSIES AND POLYPECTOMY     Hx; of   EYE SURGERY     cataracts removed - /w IOL- bilateral    FRACTURE SURGERY     R elbow- 2000   JOINT REPLACEMENT Bilateral    TONSILLECTOMY     as an adult    TOTAL KNEE ARTHROPLASTY  12/23/2011   Procedure: TOTAL KNEE ARTHROPLASTY;  Surgeon: Toribio JULIANNA Chancy, MD;  Location: [facility] OR;  Service: Orthopedics;  Laterality: Left;   TOTAL KNEE ARTHROPLASTY Right 07/12/2013   Procedure: TOTAL KNEE ARTHROPLASTY;  Surgeon: Toribio JULIANNA Chancy, MD;  Location: [facility] OR;  Service: Orthopedics;  Laterality: Right;    Allergies  Allergen Reactions   Lisinopril  Cough   Cephalosporins Hives   Tylenol  [Acetaminophen ] Other (See Comments)    I felt weird    Immunization History  Administered Date(s) Administered   DTaP 09/01/2013   Fluad Quad(high Dose 65+) 05/07/2020   Influenza, High Dose Seasonal PF 04/27/2016, 04/13/2017, 04/03/2022   Influenza-Unspecified 08/10/2012, 03/14/2014   Pfizer Covid-19 Vaccine Bivalent Booster 69yrs & up 01/01/2022   Pneumococcal Conjugate-13 07/20/2014   Pneumococcal-Unspecified 03/22/2012   Rsv, Bivalent, Protein Subunit Rsvpref,pf [place]) 05/13/2022   Td 12/26/2021   Tdap 03/22/2012   Zoster Recombinant(Shingrix) 12/29/2017, 03/17/2018    Family History  Problem Relation Age of Onset   Heart disease Mother    Heart disease Father    Heart disease Brother    Colon cancer Neg Hx    Colon polyps Neg Hx    Esophageal cancer Neg Hx    Pancreatic cancer Neg Hx    Stomach cancer Neg Hx      Current Outpatient Medications:    albuterol  (VENTOLIN  HFA) 108 (90 Base) MCG/ACT inhaler, Inhale 2 puffs into the lungs every 6 (six) hours as needed for wheezing or shortness of breath., Disp: 25.5 g, Rfl: 3   amLODipine  (NORVASC ) 5 MG tablet, Take 5 mg by mouth at bedtime., Disp: , Rfl:    aspirin  EC 81 MG tablet, Take 81 mg by mouth at bedtime., Disp: , Rfl:    benzoyl peroxide 5 % external liquid, Apply 1  Application topically at bedtime., Disp: , Rfl:    carboxymethylcellulose (REFRESH PLUS) 0.5 % SOLN, Place 1 drop into both eyes 4 (four) times daily as needed (for dryness)., Disp: , Rfl:    diclofenac Sodium (VOLTAREN) 1 % GEL, Apply 2 g topically 4 (four) times daily as needed (joint pain)., Disp: , Rfl:    divalproex  (DEPAKOTE ) 250 MG DR tablet, Take 750 mg by mouth at bedtime., Disp: , Rfl:    Docusate Sodium  (DSS) 100 MG CAPS, Take 100 mg by mouth in the morning and at bedtime., Disp: , Rfl:    donepezil  (ARICEPT ) 10 MG tablet, Take 10 mg by mouth at bedtime., Disp: , Rfl:    famotidine  (PEPCID ) 20 MG tablet, Take 1 tablet (20 mg total) by mouth at bedtime., Disp: 30 tablet, Rfl: 5   famotidine  (PEPCID ) 40 MG tablet, Take 40 mg by mouth at bedtime., Disp: , Rfl:    FLUoxetine  (PROZAC ) 10 MG capsule, Take 30 mg by mouth at bedtime., Disp: ,  Rfl:    levETIRAcetam  (KEPPRA ) 500 MG tablet, Take 1 tablet (500 mg total) by mouth 2 (two) times daily., Disp: 60 tablet, Rfl: 2   memantine  (NAMENDA ) 10 MG tablet, Take 20 mg by mouth at bedtime., Disp: , Rfl:    Multiple Vitamins-Minerals (CENTRUM SILVER 50+MEN PO), Take 1 tablet by mouth at bedtime., Disp: , Rfl:    omeprazole  (PRILOSEC) 40 MG capsule, Take 1 capsule (40 mg total) by mouth daily. To be taken 30 minutes before breakfast, Disp: 90 capsule, Rfl: 1   Pirfenidone  (ESBRIET ) 267 MG TABS, Take 3 tablets (801 mg total) by mouth 3 (three) times daily with meals., Disp: 810 tablet, Rfl: 1   predniSONE  (DELTASONE ) 10 MG tablet, Please take prednisone  40 mg x1 day, then 30 mg x1 day, then 20 mg x1 day, then 10 mg x1 day, and then 5 mg x1 day and stop, Disp: 11 tablet, Rfl: 0   rosuvastatin  (CRESTOR ) 40 MG tablet, Take 20 mg by mouth at bedtime., Disp: , Rfl:       Objective:   There were no vitals filed for this visit.  Estimated body mass index is 35.28 kg/m as calculated from the following:   Height as of 11/15/23: 5' 8 (1.727 m).   Weight  as of 11/15/23: 232 lb (105.2 kg).  @WEIGHTCHANGE @  There were no vitals filed for this visit.   Physical Exam   General: No distress. *** O2 at rest: *** Cane present: *** Sitting in wheel chair: *** Frail: *** Obese: *** Neuro: Alert and Oriented x 3. GCS 15. Speech normal Psych: Pleasant Resp:  Barrel Chest - ***.  Wheeze - ***, Crackles - ***, No overt respiratory distress CVS: Normal heart sounds. Murmurs - *** Ext: Stigmata of Connective Tissue Disease - *** HEENT: Normal upper airway. PEERL +. No post nasal drip        Assessment:     No diagnosis found.     Plan:     Patient Instructions     ICD-10-CM   1. IPF (idiopathic pulmonary fibrosis) (HCC)  J84.112     2. Agent orange exposure  Z77.098     3. DOE (dyspnea on exertion)  R06.09     4. Other fatigue  R53.83        #IPF  - Clinically stable on -Overall tolerating pirfenidone  well  but neurologic and dementia issues and frailty means this medicine is probably no longer beneficial  Plan - STOP PIRFENIDONE  aka ESBRIET  Cotninue suppprotive care  - refer cardiology  #Follow-up - 12 weeks with Dr. Geronimo or APP; 15 min visit   = Return sooner if needed   FOLLOWUP No follow-ups on file.    SIGNATURE    Dr. Dorethia Geronimo, M.D., F.C.C.P,  Pulmonary and Critical Care Medicine Staff Physician, [facility] Director - Interstitial Lung Disease  Program  P[facility] at [facility] [place], [place], [address]  Pager: [phone], If no answer or between  15:00h - 7:00h: call [phone] Telephone: [phone]  7:44 PM 12/08/2023   Moderate Complexity MDM OFFICE  2021 E/M guidelines, first released in 2021, with minor revisions added in 2023 and 2024 Must meet the requirements for 2 out of 3 dimensions to qualify.    Number and complexity of problems addressed Amount and/or complexity of data reviewed Risk of  complications and/or morbidity  One or more chronic illness with mild exacerbation,  OR progression, OR  side effects of treatment  Two or more stable chronic illnesses  One undiagnosed new problem with uncertain prognosis  One acute illness with systemic symptoms   One Acute complicated injury Must meet the requirements for 1 of 3 of the categories)  Category 1: Tests and documents, historian  Any combination of 3 of the following:  Assessment requiring an independent historian  Review of prior external note(s) from each unique source  Review of results of each unique test  Ordering of each unique test    Category 2: Interpretation of tests   Independent interpretation of a test performed by another physician/other qualified health care professional (not separately reported)  Category 3: Discuss management/tests  Discussion of management or test interpretation with external physician/other qualified health care professional/appropriate source (not separately reported) Moderate risk of morbidity from additional diagnostic testing or treatment Examples only:  Prescription drug management  Decision regarding minor surgery with identfied patient or procedure risk factors  Decision regarding elective major surgery without identified patient or procedure risk factors  Diagnosis or treatment significantly limited by social determinants of health             HIGh Complexity  OFFICE   2021 E/M guidelines, first released in 2021, with minor revisions added in 2023. Must meet the requirements for 2 out of 3 dimensions to qualify.    Number and complexity of problems addressed Amount and/or complexity of data reviewed Risk of complications and/or morbidity  Severe exacerbation of chronic illness  Acute or chronic illnesses that may pose a threat to life or bodily function, e.g., multiple trauma, acute MI, pulmonary embolus, severe respiratory distress, progressive  rheumatoid arthritis, psychiatric illness with potential threat to self or others, peritonitis, acute renal failure, abrupt change in neurological status Must meet the requirements for 2 of 3 of the categories)  Category 1: Tests and documents, historian  Any combination of 3 of the following:  Assessment requiring an independent historian  Review of prior external note(s) from each unique source  Review of results of each unique test  Ordering of each unique test    Category 2: Interpretation of tests    Independent interpretation of a test performed by another physician/other qualified health care professional (not separately reported)  Category 3: Discuss management/tests  Discussion of management or test interpretation with external physician/other qualified health care professional/appropriate source (not separately reported)  HIGH risk of morbidity from additional diagnostic testing or treatment Examples only:  Drug therapy requiring intensive monitoring for toxicity  Decision for elective major surgery with identified pateint or procedure risk factors  Decision regarding hospitalization or escalation of level of care  Decision for DNR or to de-escalate care   Parenteral controlled  substances            LEGEND - Independent interpretation involves the interpretation of a test for which there is a CPT code, and an interpretation or report is customary. When a review and interpretation of a test is performed and documented by the provider, but not separately reported (billed), then this would represent an independent interpretation. This report does not need to conform to the usual standards of a complete report of the test. This does not include interpretation of tests that do not have formal reports such as a complete blood count with differential and blood cultures. Examples would include reviewing a chest radiograph and documenting in the medical record an  interpretation, but not separately reporting (billing) the interpretation  of the chest radiograph.   An appropriate source includes professionals who are not health care professionals but may be involved in the management of the patient, such as a Clinical research associate, upper officer, case manager or teacher, and does not include discussion with family or informal caregivers.    - SDOH: SDOH are the conditions in the environments where people are born, live, learn, work, play, worship, and age that affect a wide range of health, functioning, and quality-of-life outcomes and risks. (e.g., housing, food insecurity, transportation, etc.). SDOH-related Z codes ranging from Z55-Z65 are the ICD-10-CM diagnosis codes used to document SDOH data Z55 - Problems related to education and literacy Z56 - Problems related to employment and unemployment Z57 - Occupational exposure to risk factors Z58 - Problems related to physical environment Z59 - Problems related to housing and economic circumstances [phone] - Problems related to social environment [phone] - Problems related to upbringing [phone] - Other problems related to primary support group, including family circumstances Z61 - Problems related to certain psychosocial circumstances Z65 - Problems related to other psychosocial circumstances

## 2023-12-09 ENCOUNTER — Ambulatory Visit (INDEPENDENT_AMBULATORY_CARE_PROVIDER_SITE_OTHER): Admitting: Internal Medicine

## 2023-12-09 ENCOUNTER — Ambulatory Visit: Admitting: Internal Medicine

## 2023-12-09 ENCOUNTER — Encounter: Payer: Self-pay | Admitting: Internal Medicine

## 2023-12-09 VITALS — BP 116/58 | HR 65 | Temp 97.7°F | Ht 68.0 in

## 2023-12-09 DIAGNOSIS — R053 Chronic cough: Secondary | ICD-10-CM

## 2023-12-09 DIAGNOSIS — J84112 Idiopathic pulmonary fibrosis: Secondary | ICD-10-CM

## 2023-12-09 DIAGNOSIS — R413 Other amnesia: Secondary | ICD-10-CM

## 2023-12-09 DIAGNOSIS — R296 Repeated falls: Secondary | ICD-10-CM | POA: Diagnosis not present

## 2023-12-09 DIAGNOSIS — R5381 Other malaise: Secondary | ICD-10-CM | POA: Diagnosis not present

## 2023-12-09 DIAGNOSIS — Z77098 Contact with and (suspected) exposure to other hazardous, chiefly nonmedicinal, chemicals: Secondary | ICD-10-CM | POA: Diagnosis not present

## 2023-12-09 MED ORDER — BENZONATATE 100 MG PO CAPS: 200.0000 mg | ORAL_CAPSULE | Freq: Three times a day (TID) | ORAL | 6 refills | Status: DC | PRN

## 2023-12-09 NOTE — Telephone Encounter (Signed)
 Pt is seeing MR 12/09/23

## 2023-12-13 ENCOUNTER — Telehealth: Payer: Self-pay | Admitting: *Deleted

## 2023-12-13 NOTE — Telephone Encounter (Signed)
 This patient was seen on Thursday, 12/09/23 by Dr. Geronimo.  His wife states he was 2 days past his referral from the [place] that was approved for him to be seen.  Can one of you follow up on this for them?  Thank you.

## 2023-12-15 NOTE — Telephone Encounter (Signed)
 I have requested a new authorization to attempt to cover the visit from 12/09/2023, however per the front staff, [place] auth expired two days ago but he is okay with filing with his insurance.

## 2023-12-20 NOTE — Telephone Encounter (Signed)
 [facility] Authorization received covering 12/09/2023 visit- patient is aware- nothing further needed

## 2024-03-08 ENCOUNTER — Encounter: Payer: Self-pay | Admitting: Nurse Practitioner

## 2024-03-08 ENCOUNTER — Telehealth: Admitting: Nurse Practitioner

## 2024-03-08 DIAGNOSIS — J84112 Idiopathic pulmonary fibrosis: Secondary | ICD-10-CM

## 2024-03-08 DIAGNOSIS — Z7739 Contact with and (suspected) exposure to other war theater: Secondary | ICD-10-CM | POA: Diagnosis not present

## 2024-03-08 DIAGNOSIS — Z515 Encounter for palliative care: Secondary | ICD-10-CM

## 2024-03-08 MED ORDER — BENZONATATE 100 MG PO CAPS: 200.0000 mg | ORAL_CAPSULE | Freq: Three times a day (TID) | ORAL | 6 refills | Status: AC | PRN

## 2024-03-08 NOTE — Patient Instructions (Signed)
 Continue benzonatate  1 capsule Three times a day as needed for cough  Continue supportive care with hospice team  Let us  know if you need anything
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
## 2024-03-08 NOTE — Assessment & Plan Note (Signed)
 Progressive phenotype; categorized as probable UIP on imaging and diagnosed with IPF in July 2023. Discontinued antifibrotic therapy earlier this year. On hospice now. No further interventions. Continue supportive care management with hospice. Benzonatate  rx refilled. Follow up as needed with pulm  Patient Instructions  Continue benzonatate  1 capsule Three times a day as needed for cough  Continue supportive care with hospice team  Let us  know if you need anything

## 2024-03-08 NOTE — Assessment & Plan Note (Signed)
 Followed by in home hospice. Significant decline in functional and cognitive capacity. Continue supportive care

## 2024-03-10 ENCOUNTER — Other Ambulatory Visit (HOSPITAL_COMMUNITY): Payer: Self-pay

## 2024-03-10 DIAGNOSIS — R059 Cough, unspecified: Secondary | ICD-10-CM

## 2024-03-10 DIAGNOSIS — R1310 Dysphagia, unspecified: Secondary | ICD-10-CM

## 2024-03-28 ENCOUNTER — Ambulatory Visit (HOSPITAL_COMMUNITY)
Admission: RE | Admit: 2024-03-28 | Discharge: 2024-03-28 | Disposition: A | Discharge status: Home / Self Care | Source: Ambulatory Visit | Attending: *Deleted | Admitting: *Deleted

## 2024-03-28 DIAGNOSIS — R059 Cough, unspecified: Secondary | ICD-10-CM | POA: Diagnosis not present

## 2024-03-28 DIAGNOSIS — R1312 Dysphagia, oropharyngeal phase: Secondary | ICD-10-CM | POA: Diagnosis not present

## 2024-03-28 DIAGNOSIS — R1310 Dysphagia, unspecified: Secondary | ICD-10-CM | POA: Diagnosis present
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
## 2024-04-06 ENCOUNTER — Ambulatory Visit: Admitting: Speech Pathology

## 2024-05-16 ENCOUNTER — Ambulatory Visit: Admitting: Speech Pathology

## 2024-06-15 ENCOUNTER — Ambulatory Visit: Admitting: Speech Pathology
